# Patient Record
Sex: Female | Born: 1970
Health system: Southern US, Community
[De-identification: ages and names within clinical notes are randomized; demographics above are authoritative.]

## PROBLEM LIST (undated history)

## (undated) DIAGNOSIS — Z803 Family history of malignant neoplasm of breast: Secondary | ICD-10-CM

## (undated) DIAGNOSIS — B977 Papillomavirus as the cause of diseases classified elsewhere: Secondary | ICD-10-CM

## (undated) DIAGNOSIS — B009 Herpesviral infection, unspecified: Secondary | ICD-10-CM

## (undated) DIAGNOSIS — O021 Missed abortion: Secondary | ICD-10-CM

## (undated) DIAGNOSIS — E785 Hyperlipidemia, unspecified: Secondary | ICD-10-CM

## (undated) DIAGNOSIS — K5900 Constipation, unspecified: Secondary | ICD-10-CM

## (undated) DIAGNOSIS — K219 Gastro-esophageal reflux disease without esophagitis: Secondary | ICD-10-CM

## (undated) DIAGNOSIS — N809 Endometriosis, unspecified: Secondary | ICD-10-CM

## (undated) DIAGNOSIS — Z800 Family history of malignant neoplasm of digestive organs: Secondary | ICD-10-CM

## (undated) DIAGNOSIS — J302 Other seasonal allergic rhinitis: Secondary | ICD-10-CM

## (undated) DIAGNOSIS — Z87442 Personal history of urinary calculi: Secondary | ICD-10-CM

## (undated) HISTORY — DX: Papillomavirus as the cause of diseases classified elsewhere: B97.7

## (undated) HISTORY — DX: Endometriosis, unspecified: N80.9

## (undated) HISTORY — PX: COLONOSCOPY: SHX174

## (undated) HISTORY — DX: Herpesviral infection, unspecified: B00.9

## (undated) HISTORY — PX: WISDOM TOOTH EXTRACTION: SHX21

## (undated) HISTORY — PX: CATARACT EXTRACTION: SUR2

## (undated) HISTORY — DX: Family history of malignant neoplasm of digestive organs: Z80.0

## (undated) HISTORY — DX: Family history of malignant neoplasm of breast: Z80.3

## (undated) HISTORY — PX: COLPOSCOPY: SHX161

---

## 1998-05-16 HISTORY — PX: RETINAL DETACHMENT SURGERY: SHX105

## 1998-07-24 ENCOUNTER — Other Ambulatory Visit: Admission: RE | Admit: 1998-07-24 | Discharge: 1998-07-24 | Payer: Self-pay | Admitting: *Deleted

## 1999-05-04 ENCOUNTER — Ambulatory Visit (HOSPITAL_COMMUNITY): Admission: RE | Admit: 1999-05-04 | Discharge: 1999-05-05 | Payer: Self-pay | Admitting: Ophthalmology

## 1999-05-04 ENCOUNTER — Encounter: Payer: Self-pay | Admitting: Ophthalmology

## 1999-10-15 ENCOUNTER — Other Ambulatory Visit: Admission: RE | Admit: 1999-10-15 | Discharge: 1999-10-15 | Payer: Self-pay | Admitting: *Deleted

## 2001-01-08 ENCOUNTER — Other Ambulatory Visit: Admission: RE | Admit: 2001-01-08 | Discharge: 2001-01-08 | Payer: Self-pay | Admitting: *Deleted

## 2001-07-26 ENCOUNTER — Ambulatory Visit (HOSPITAL_COMMUNITY): Admission: RE | Admit: 2001-07-26 | Discharge: 2001-07-26 | Payer: Self-pay | Admitting: Neurology

## 2001-07-26 ENCOUNTER — Encounter: Payer: Self-pay | Admitting: Neurology

## 2001-08-30 ENCOUNTER — Encounter: Admission: RE | Admit: 2001-08-30 | Discharge: 2001-08-30 | Payer: Self-pay | Admitting: *Deleted

## 2001-11-12 ENCOUNTER — Other Ambulatory Visit: Admission: RE | Admit: 2001-11-12 | Discharge: 2001-11-12 | Payer: Self-pay | Admitting: Obstetrics & Gynecology

## 2002-02-14 ENCOUNTER — Encounter: Admission: RE | Admit: 2002-02-14 | Discharge: 2002-02-14 | Payer: Self-pay | Admitting: *Deleted

## 2002-05-25 ENCOUNTER — Inpatient Hospital Stay (HOSPITAL_COMMUNITY): Admission: AD | Admit: 2002-05-25 | Discharge: 2002-05-25 | Payer: Self-pay | Admitting: Obstetrics & Gynecology

## 2002-06-03 ENCOUNTER — Inpatient Hospital Stay (HOSPITAL_COMMUNITY): Admission: AD | Admit: 2002-06-03 | Discharge: 2002-06-06 | Payer: Self-pay | Admitting: Obstetrics and Gynecology

## 2002-06-04 ENCOUNTER — Encounter (INDEPENDENT_AMBULATORY_CARE_PROVIDER_SITE_OTHER): Payer: Self-pay | Admitting: Specialist

## 2002-06-07 ENCOUNTER — Encounter: Admission: RE | Admit: 2002-06-07 | Discharge: 2002-07-07 | Payer: Self-pay | Admitting: Obstetrics & Gynecology

## 2002-07-04 ENCOUNTER — Other Ambulatory Visit: Admission: RE | Admit: 2002-07-04 | Discharge: 2002-07-04 | Payer: Self-pay | Admitting: Obstetrics & Gynecology

## 2002-07-08 ENCOUNTER — Encounter: Admission: RE | Admit: 2002-07-08 | Discharge: 2002-08-07 | Payer: Self-pay | Admitting: Obstetrics & Gynecology

## 2002-09-06 ENCOUNTER — Encounter: Admission: RE | Admit: 2002-09-06 | Discharge: 2002-10-06 | Payer: Self-pay | Admitting: Obstetrics & Gynecology

## 2002-11-06 ENCOUNTER — Encounter: Admission: RE | Admit: 2002-11-06 | Discharge: 2002-12-06 | Payer: Self-pay | Admitting: Obstetrics & Gynecology

## 2003-01-03 ENCOUNTER — Other Ambulatory Visit: Admission: RE | Admit: 2003-01-03 | Discharge: 2003-01-03 | Payer: Self-pay | Admitting: Obstetrics & Gynecology

## 2003-05-22 ENCOUNTER — Encounter: Admission: RE | Admit: 2003-05-22 | Discharge: 2003-05-22 | Payer: Self-pay | Admitting: Family Medicine

## 2003-07-31 ENCOUNTER — Other Ambulatory Visit: Admission: RE | Admit: 2003-07-31 | Discharge: 2003-07-31 | Payer: Self-pay | Admitting: Obstetrics & Gynecology

## 2004-01-23 ENCOUNTER — Inpatient Hospital Stay (HOSPITAL_COMMUNITY): Admission: AD | Admit: 2004-01-23 | Discharge: 2004-01-25 | Payer: Self-pay | Admitting: Obstetrics & Gynecology

## 2004-02-21 ENCOUNTER — Inpatient Hospital Stay (HOSPITAL_COMMUNITY): Admission: AD | Admit: 2004-02-21 | Discharge: 2004-02-23 | Payer: Self-pay | Admitting: Obstetrics and Gynecology

## 2004-02-24 ENCOUNTER — Encounter: Admission: RE | Admit: 2004-02-24 | Discharge: 2004-03-25 | Payer: Self-pay | Admitting: Obstetrics & Gynecology

## 2004-03-26 ENCOUNTER — Encounter: Admission: RE | Admit: 2004-03-26 | Discharge: 2004-04-25 | Payer: Self-pay | Admitting: Obstetrics & Gynecology

## 2004-05-26 ENCOUNTER — Encounter: Admission: RE | Admit: 2004-05-26 | Discharge: 2004-06-25 | Payer: Self-pay | Admitting: Obstetrics & Gynecology

## 2004-06-26 ENCOUNTER — Encounter: Admission: RE | Admit: 2004-06-26 | Discharge: 2004-07-26 | Payer: Self-pay | Admitting: Obstetrics & Gynecology

## 2004-08-24 ENCOUNTER — Encounter: Admission: RE | Admit: 2004-08-24 | Discharge: 2004-09-23 | Payer: Self-pay | Admitting: Obstetrics & Gynecology

## 2004-10-24 ENCOUNTER — Encounter: Admission: RE | Admit: 2004-10-24 | Discharge: 2004-11-01 | Payer: Self-pay | Admitting: Obstetrics & Gynecology

## 2010-03-22 ENCOUNTER — Encounter: Admission: RE | Admit: 2010-03-22 | Discharge: 2010-03-22 | Payer: Self-pay | Admitting: Family Medicine

## 2010-07-16 ENCOUNTER — Other Ambulatory Visit: Payer: Self-pay | Admitting: Obstetrics & Gynecology

## 2010-10-01 NOTE — H&P (Signed)
Ashley Marshall, Ashley Marshall               ACCOUNT NO.:  [id number]   MEDICAL RECORD NO.:  [id number]          PATIENT TYPE:  INP   LOCATION:  9174                          FACILITY:  [facility]   PHYSICIAN:  Lenoard Aden, M.D.DATE OF BIRTH:  07-Jun-1970   DATE OF ADMISSION:  02/21/2004  DATE OF DISCHARGE:                                HISTORY & PHYSICAL   CHIEF COMPLAINT:  Labor.   HISTORY OF PRESENT ILLNESS:  The patient is a 40 year old white female, G3,  P1, EDD of March 01, 2004, at 38+ weeks, with increasing frequency of  contractions.   MEDICATIONS:  She has medications including prenatal vitamins.   ALLERGIES:  1.  FLAGYL.  2.  AMBIEN.  3.  SEPTRA.   PAST MEDICAL HISTORY:  History of an SAB, 2002, spontaneous delivery in  2004, with induction for pregnancy induced hypertension.  She has no other  substantial medical history except for history of kidney stones and a  retinal surgery in 2000.   FAMILY HISTORY:  She has a family history of mitral valve prolapse,  myocardial infarction, and stomach and liver cancer.   LABORATORY DATA:  Prenatal lab data reveals a blood type of O positive, Rh  antibody negative, rubella immune, hepatitis and HIV negative, GBS negative.   PHYSICAL EXAMINATION:  GENERAL:  She is a well-developed, well-nourished  white female in no acute distress.  HEENT:  Normal.  LUNGS:  Clear.  HEART:  Regular rhythm.  ABDOMEN:  Soft, gravid, nontender.  Estimated fetal weight 7 to 7-1/2  pounds.  CERVIX:  Cervix is 7 cm, 90%, at vertex, -1.  Artificial rupture of  membranes reveals clear liquid.  EXTREMITIES:  __________.  NEUROLOGIC:  Nonfocal.   IMPRESSION:  Term intrauterine pregnancy in active labor.  History of PIH,  GBS negative.   PLAN:  Anticipate attempts at vaginal delivery, epidural as needed.      RJT/MEDQ  D:  02/21/2004  T:  02/21/2004  Job:  [id number]

## 2010-10-01 NOTE — Consult Note (Signed)
   NAMEJORGE, Ashley Marshall NO.:  [id number]   MEDICAL RECORD NO.:  [id number]                   PATIENT TYPE:   LOCATION:                                       FACILITY:   PHYSICIAN:  Lenoard Aden, M.D.             DATE OF BIRTH:   DATE OF CONSULTATION:  05/25/2002  DATE OF DISCHARGE:                                   CONSULTATION   CHIEF COMPLAINT:  Rule out preeclampsia.   HISTORY OF PRESENT ILLNESS:  The patient is a 40 year old, white female, G2,  P0, EDD June 17, 2002, at 36+ weeks, who had elevated blood pressures as  seen in the office on May 23, 2002, of 144/92 and repeat was 124/80.  She  had a headache on May 24, 2002.  Reports good fetal movement.  Today she  presents for laboratory evaluation, blood pressure check, and fetal  surveillance.   MEDICATIONS:  1. Valtrex.  2. Prenatal vitamins.   ALLERGIES:  FLAGYL, AMBIEN, and SEPTRA.   PAST MEDICAL HISTORY:  1. History of SAB without D&E in December of 2002.  2. History of abnormal Pap smear.  3. History of HSV.   FAMILY HISTORY:  Remarkable for hypertension, DVT, and MVP.   PAST SURGICAL HISTORY:  Noncontributory, except for detached retina surgery  and history of ureterolithiasis.   PRENATAL LABORATORY DATA:  Blood type O+.  Rubella immune.  Hepatitis B  surface antigen negative.  Hepatitis B surface negative.  HIV nonreactive.  GC and chlamydia negative.   PHYSICAL EXAMINATION:  VITAL SIGNS:  Blood pressure 140-150/80-92.  GENERAL APPEARANCE:  She is a well-developed, well-nourished, white female  in no apparent distress.  HEENT:  Normal.  LUNGS:  Clear.  HEART:  Regular rhythm.  ABDOMEN:  Soft, gravid, and nontender.  NST is reactive.  PELVIC:  The cervical exam was deferred.  NEUROLOGIC:  Nonfocal.  DTRs 2+.  No evidence of clonus.   LABORATORY DATA:  SGOT 14 and SPGT less than 19.  Creatinine 0.5.  Uric acid  3.2.  Platelet count 223.  LDH 118.   IMPRESSION:  1. 36-week intrauterine pregnancy.  2. Probable gestational hypertension.  3. No stigmata of preeclampsia.    PLAN:  1. Out of work.  2. Twice weekly visits in the office.  3. Check 24-hour urine.  4. Preeclamptic precautions given.                                               Lenoard Aden, M.D.    RJT/MEDQ  D:  05/25/2002  T:  05/25/2002  Job:  [id number]

## 2010-10-01 NOTE — H&P (Signed)
NAMEGIOVANNI, BATH NO.:  [id number]   MEDICAL RECORD NO.:  [id number]                   PATIENT TYPE:   LOCATION:                                       FACILITY:   PHYSICIAN:  [place] B. Earlene Plater, M.D.               DATE OF BIRTH:   DATE OF ADMISSION:  06/03/2002  DATE OF DISCHARGE:                                HISTORY & PHYSICAL   ADMISSION DIAGNOSES:  1. A 38 week intrauterine pregnancy.  2. Pregnancy induced hypertension.   HISTORY OF PRESENT ILLNESS:  A 40 year old white female gravida 2, para 0,  A1 at 38 weeks seen in the office today in follow-up.  Noted to have blood  pressure of 140/100.  Does not have headaches, visual disturbances, or  abdominal pain.  Has had mild elevation of blood pressure in the office in  the last two weeks in the 130s/80s-90s range.  Had a 24-hour urine on  May 26, 2002 which showed 126 mg protein.  Reports active fetus.  No  contractions, leakage, or vaginal bleeding.  Prenatal care [facility]  Genia Del, M.D.  Otherwise, uncomplicated except for history of HSV  taking Valtrex for suppression.  No recent symptoms.   PAST MEDICAL HISTORY:  History of kidney stones.   PAST SURGICAL HISTORY:  Eye surgery for detached retina.   FAMILY HISTORY:  Heart disease, hypertension.  Mother with a history of DVT  and anemia.  Paternal grandmother with diabetes and stomach cancer.   SOCIAL HISTORY:  No alcohol, tobacco, or drugs.   MEDICATIONS:  Prenatal vitamins and Tums.   ALLERGIES:  FLAGYL, AMBIEN, SEPTRA.   PRENATAL LABORATORIES:  O+.  Rubella immune.  RPR, hepatitis B, HIV, GC,  Chlamydia all negative.  Glucola normal.  Group B Strep negative.   PHYSICAL EXAMINATION:  VITAL SIGNS:  Blood pressure 140/100, weight 170.  Fetal heart tones 130s and reactive tracing.  GENERAL:  Alert and oriented, no acute distress.  SKIN:  Warm, dry.  No lesions.  HEART:  Regular rate and rhythm.  LUNGS:  Clear  to auscultation.  ABDOMEN:  Gravid.  Fundal height appropriate.  PELVIC:  Cervix is 1-2 cm dilated, 70% effaced, -1 station, and vertex.  EXTREMITIES:  Lower extremities are free of edema and there is no  hyperreflexia or clonus.    ASSESSMENT:  A 38 week intrauterine pregnancy with pregnancy induced  hypertension.  No severe symptoms or signs at this point.   PLAN:  Admission for induction of labor.                                               Gerri Spore B. Earlene Plater, M.D.    WBD/MEDQ  D:  06/03/2002  T:  06/03/2002  Job:  [id number]

## 2010-10-01 NOTE — H&P (Signed)
Ashley Marshall, HORINE                         ACCOUNT NO.:  [id number]   MEDICAL RECORD NO.:  [id number]                   PATIENT TYPE:  INP   LOCATION:  9156                                 [facility]ILITY:  [facility]   PHYSICIAN:  Genia Del, M.D.             DATE OF BIRTH:  November 11, 1970   DATE OF ADMISSION:  01/23/2004  DATE OF DISCHARGE:                                HISTORY & PHYSICAL   HISTORY AND PHYSICAL:  Ashley Marshall is a 40 year old, G3, P1, A1, estimated  date of delivery February 29, 2004, at 34 weeks and 5 days gestation.   REASON FOR ADMISSION:  Oligohydramnios at the 5th percentile.   HISTORY OF PRESENT ILLNESS:  The patient has a history of mild PIH;  therefore, an ultrasound was done to verify growth and amniotic fluid and  the [facility] office.  The amniotic fluid was found to be at 7.9 cm  for the 5th percentile.  Estimated fetal weight was 83rd percentile.  Dopplers were normal.  Fetal movement and fetal breathing movements were  present.  The presentation was cephalic.  The patient experienced good fetal  movements, no uterine contractions, no fluid leak, no vaginal bleeding, and  no PIH symptoms.   PAST MEDICAL HISTORY:  Positive for mild PIH during her last pregnancy.   PAST SURGICAL HISTORY:  Detached retina.  Surgical correct in December of  2000.   PAST OBSTETRICAL HISTORY:  1.  December of 2002 - 7 plus weeks complete spontaneous abortion.  2.  January of 2004 - 38 week spontaneous vaginal delivery, baby girl, 6      pounds, 14 ounces.  The patient was induced for mild PIH.   DRUG ALLERGIES:  1.  FLAGYL.  2.  AMBIEN.  3.  SEPTRA.   CURRENT MEDICATIONS:  Prenatal vitamins.   SOCIAL HISTORY:  Married.  Nonsmoker.   FAMILY HISTORY:  Noncontributory.   GYNECOLOGIC HISTORY:  Rare genital HSV outbreaks.  Abnormal Pap test.  Colposcopy.  No cervical treatment needed.   HISTORY OF PRESENT PREGNANCY:  First trimester was normal.  Labs showed  hemoglobin at 13.5, platelets 265, blood group O positive, Rh antibody  negative, syphilis nonreactive, __________ nonreactive, HIV nonreactive.  Rubella titer is immune.  Gonorrhea and Chlamydia negative.  Pap test  normal.  In the second trimester, triple test was within normal limits.  Ultrasound review of anatomy was within normal limits at 20 plus weeks.  Placenta posterior, normal.  Amniotic fluid normal.  Cervix closed, 5.4 cm.  In the third trimester, 1 hour GTT was normal at 26 plus weeks.  Group B  strep is not done yet.  Blood pressures remained normal until the last  visit, where it was slightly increased.   REVIEW OF SYSTEMS:  CONSTITUTIONAL:  Negative.  HEENT:  Negative.  CARDIOVASCULAR:  Negative.  RESPIRATORY:  Negative.  GI:  Negative.  GU:  Negative.  DERMATOLOGIC:  Negative.  NEUROLOGIC:  Negative.   PHYSICAL EXAMINATION:  GENERAL:  In no apparent distress.  VITAL SIGNS:  Blood pressure 136/94, pulse regular, afebrile.  Urine for  proteins negative.  Glucose negative.  LUNGS:  Clear bilaterally.  Regular cardiac rhythm.  Gravid uterus.  Cephalic presentation.  VAGINAL EXAM:  Deferred.  ABDOMEN:  Fetal heart rate 159 per minute.  EXTREMITIES:  Lower limbs normal.  No edema.  No clonus.   ULTRASOUND:  As described in the HPI.   IMPRESSION:  G3, P1, A1, at 34 plus weeks gestation with oligohydramnios at  the 5th percentile.  Fetal well-being reassuring.  Mild pregnancy-induced  hypertension.   PLAN:  Admit to [facility].  IV fluids, Ringers lactate  150.  Monitoring.  PIH labs.  Repeat amniotic fluid index in 2 days.  May  use __________ then for IV fluids at home and monitoring.                                               Genia Del, M.D.    ML/MEDQ  D:  01/25/2004  T:  01/25/2004  Job:  [id number]

## 2010-10-01 NOTE — Op Note (Signed)
NAMELORAL, CAMPI               ACCOUNT NO.:  [id number]   MEDICAL RECORD NO.:  [id number]          PATIENT TYPE:  INP   LOCATION:  9125                          [facility]ACILITY:  [facility]   PHYSICIAN:  Lenoard Aden, M.D.DATE OF BIRTH:  [birth year]   DATE OF PROCEDURE:  02/21/2004  DATE OF DISCHARGE:                                 OPERATIVE REPORT   DELIVERY NOTE:   IDENTIFICATION FOR OPERATIVE DELIVERY:  Maternal exhaustion, requests  assistance.   POSTOPERATIVE DIAGNOSIS:  Maternal exhaustion, requests assistance.   PROCEDURE:  Kiwi cup outlet vacuum-assisted vaginal delivery x3 pulls.   SURGEON:  Lenoard Aden, M.D.   ANESTHESIA:  None except for local.   ESTIMATED BLOOD LOSS:  500 mL.   COMPLICATIONS:  None.   BRIEF DESCRIPTION OF PROCEDURE:  After being apprised of the risks and  benefits of vacuum assistance including small incidence of cephalohematoma,  scalp laceration, intracranial hemorrhage, Kiwi cup is applied at the proper  location, LOA less than 45 degrees, +3/+4 station, for 3 pulls over a second-  degree midline episiotomy of a full-term living female.  Upon suctioning of  the fetal vertex, small shoulder dystocia is encountered, which is relieved  with McRoberts' maneuver and mild suprapubic pressure.  Apgars are 8 and 9.  Placenta is delivered spontaneously intact, 3-vessel cord is noted, cord  bloods are collected.  No cervical or vaginal lacerations are noted.  Repair  of episiotomy with 3-0 Vicryl Rapide.  Estimated blood loss -- 500 mL.  Patient and baby recovering well.      RJT/MEDQ  D:  02/21/2004  T:  02/22/2004  Job:  [id number]   cc:   [facility]

## 2011-02-15 ENCOUNTER — Other Ambulatory Visit: Payer: Self-pay | Admitting: Family Medicine

## 2011-02-15 DIAGNOSIS — Z1231 Encounter for screening mammogram for malignant neoplasm of breast: Secondary | ICD-10-CM

## 2011-03-24 ENCOUNTER — Ambulatory Visit
Admission: RE | Admit: 2011-03-24 | Discharge: 2011-03-24 | Disposition: A | Discharge status: Home / Self Care | Payer: 59 | Source: Ambulatory Visit | Attending: Family Medicine | Admitting: Family Medicine

## 2011-03-24 DIAGNOSIS — Z1231 Encounter for screening mammogram for malignant neoplasm of breast: Secondary | ICD-10-CM

## 2011-09-12 ENCOUNTER — Other Ambulatory Visit: Payer: Self-pay | Admitting: Gastroenterology

## 2012-02-20 ENCOUNTER — Other Ambulatory Visit: Payer: Self-pay | Admitting: Family Medicine

## 2012-02-20 DIAGNOSIS — Z1231 Encounter for screening mammogram for malignant neoplasm of breast: Secondary | ICD-10-CM

## 2012-03-26 ENCOUNTER — Ambulatory Visit
Admission: RE | Admit: 2012-03-26 | Discharge: 2012-03-26 | Disposition: A | Discharge status: Home / Self Care | Payer: 59 | Source: Ambulatory Visit | Attending: Family Medicine | Admitting: Family Medicine

## 2012-03-26 DIAGNOSIS — Z1231 Encounter for screening mammogram for malignant neoplasm of breast: Secondary | ICD-10-CM

## 2012-06-30 ENCOUNTER — Other Ambulatory Visit: Payer: Self-pay

## 2013-02-18 ENCOUNTER — Other Ambulatory Visit: Payer: Self-pay

## 2013-02-18 DIAGNOSIS — Z1231 Encounter for screening mammogram for malignant neoplasm of breast: Secondary | ICD-10-CM

## 2013-03-21 ENCOUNTER — Other Ambulatory Visit: Payer: Self-pay

## 2013-03-27 ENCOUNTER — Ambulatory Visit
Admission: RE | Admit: 2013-03-27 | Discharge: 2013-03-27 | Disposition: A | Discharge status: Home / Self Care | Payer: 59 | Source: Ambulatory Visit

## 2013-03-27 DIAGNOSIS — Z1231 Encounter for screening mammogram for malignant neoplasm of breast: Secondary | ICD-10-CM

## 2014-01-23 ENCOUNTER — Other Ambulatory Visit: Payer: Self-pay

## 2014-01-23 DIAGNOSIS — Z1231 Encounter for screening mammogram for malignant neoplasm of breast: Secondary | ICD-10-CM

## 2014-04-02 ENCOUNTER — Ambulatory Visit
Admission: RE | Admit: 2014-04-02 | Discharge: 2014-04-02 | Disposition: A | Discharge status: Home / Self Care | Payer: 59 | Source: Ambulatory Visit

## 2014-04-02 DIAGNOSIS — Z1231 Encounter for screening mammogram for malignant neoplasm of breast: Secondary | ICD-10-CM

## 2015-03-04 ENCOUNTER — Other Ambulatory Visit: Payer: Self-pay

## 2015-03-04 DIAGNOSIS — Z1231 Encounter for screening mammogram for malignant neoplasm of breast: Secondary | ICD-10-CM

## 2015-04-08 ENCOUNTER — Ambulatory Visit
Admission: RE | Admit: 2015-04-08 | Discharge: 2015-04-08 | Disposition: A | Discharge status: Home / Self Care | Payer: 59 | Source: Ambulatory Visit

## 2015-04-08 DIAGNOSIS — Z1231 Encounter for screening mammogram for malignant neoplasm of breast: Secondary | ICD-10-CM

## 2015-07-02 DIAGNOSIS — J111 Influenza due to unidentified influenza virus with other respiratory manifestations: Secondary | ICD-10-CM | POA: Diagnosis not present

## 2015-09-07 DIAGNOSIS — Z6824 Body mass index (BMI) 24.0-24.9, adult: Secondary | ICD-10-CM | POA: Diagnosis not present

## 2015-09-07 DIAGNOSIS — Z01419 Encounter for gynecological examination (general) (routine) without abnormal findings: Secondary | ICD-10-CM | POA: Diagnosis not present

## 2016-03-08 ENCOUNTER — Other Ambulatory Visit: Payer: Self-pay | Admitting: Family Medicine

## 2016-03-08 DIAGNOSIS — Z1231 Encounter for screening mammogram for malignant neoplasm of breast: Secondary | ICD-10-CM

## 2016-04-12 ENCOUNTER — Ambulatory Visit
Admission: RE | Admit: 2016-04-12 | Discharge: 2016-04-12 | Disposition: A | Discharge status: Home / Self Care | Payer: 59 | Source: Ambulatory Visit | Attending: Family Medicine | Admitting: Family Medicine

## 2016-04-12 DIAGNOSIS — Z1231 Encounter for screening mammogram for malignant neoplasm of breast: Secondary | ICD-10-CM

## 2016-04-13 DIAGNOSIS — H5213 Myopia, bilateral: Secondary | ICD-10-CM | POA: Diagnosis not present

## 2016-04-13 DIAGNOSIS — H40052 Ocular hypertension, left eye: Secondary | ICD-10-CM | POA: Diagnosis not present

## 2016-04-13 DIAGNOSIS — H40051 Ocular hypertension, right eye: Secondary | ICD-10-CM | POA: Diagnosis not present

## 2016-04-18 DIAGNOSIS — R350 Frequency of micturition: Secondary | ICD-10-CM | POA: Diagnosis not present

## 2016-04-18 MED FILL — NITROFURANTOIN MONO-MCR 100: 100 | 7 days supply | Qty: 14 | Fill #0

## 2016-05-11 DIAGNOSIS — A6000 Herpesviral infection of urogenital system, unspecified: Secondary | ICD-10-CM | POA: Diagnosis not present

## 2016-05-11 DIAGNOSIS — E7800 Pure hypercholesterolemia, unspecified: Secondary | ICD-10-CM | POA: Diagnosis not present

## 2016-05-11 DIAGNOSIS — Z0000 Encounter for general adult medical examination without abnormal findings: Secondary | ICD-10-CM | POA: Diagnosis not present

## 2016-05-18 MED FILL — AMOX TR-K CLV 875-125 MG TA: 875-125 | 10 days supply | Qty: 20 | Fill #0

## 2016-07-21 DIAGNOSIS — L814 Other melanin hyperpigmentation: Secondary | ICD-10-CM | POA: Diagnosis not present

## 2016-07-21 DIAGNOSIS — Z86018 Personal history of other benign neoplasm: Secondary | ICD-10-CM | POA: Diagnosis not present

## 2016-07-21 DIAGNOSIS — L719 Rosacea, unspecified: Secondary | ICD-10-CM | POA: Diagnosis not present

## 2016-07-21 DIAGNOSIS — L718 Other rosacea: Secondary | ICD-10-CM | POA: Diagnosis not present

## 2016-07-21 DIAGNOSIS — D1801 Hemangioma of skin and subcutaneous tissue: Secondary | ICD-10-CM | POA: Diagnosis not present

## 2016-07-21 DIAGNOSIS — L821 Other seborrheic keratosis: Secondary | ICD-10-CM | POA: Diagnosis not present

## 2016-07-21 DIAGNOSIS — D225 Melanocytic nevi of trunk: Secondary | ICD-10-CM | POA: Diagnosis not present

## 2016-08-24 MED FILL — NEO/POLYMYXIN/DEXAMETH DROP: 3.5-10000-0 | 50 days supply | Qty: 5 | Fill #0

## 2016-09-14 ENCOUNTER — Encounter: Payer: Self-pay | Admitting: Obstetrics & Gynecology

## 2016-09-14 ENCOUNTER — Ambulatory Visit (INDEPENDENT_AMBULATORY_CARE_PROVIDER_SITE_OTHER): Payer: 59 | Admitting: Obstetrics & Gynecology

## 2016-09-14 VITALS — BP 132/84 | Ht 62.5 in | Wt 138.0 lb

## 2016-09-14 DIAGNOSIS — Z01419 Encounter for gynecological examination (general) (routine) without abnormal findings: Secondary | ICD-10-CM

## 2016-09-14 DIAGNOSIS — N945 Secondary dysmenorrhea: Secondary | ICD-10-CM | POA: Diagnosis not present

## 2016-09-14 DIAGNOSIS — N943 Premenstrual tension syndrome: Secondary | ICD-10-CM | POA: Diagnosis not present

## 2016-09-14 DIAGNOSIS — Z3049 Encounter for surveillance of other contraceptives: Secondary | ICD-10-CM

## 2016-09-14 DIAGNOSIS — Z1151 Encounter for screening for human papillomavirus (HPV): Secondary | ICD-10-CM

## 2016-09-14 MED ORDER — NORETHINDRONE 0.35 MG PO TABS: 1.0000 | ORAL_TABLET | Freq: Every day | ORAL | 4 refills | Status: DC

## 2016-09-14 NOTE — Patient Instructions (Signed)
1. Encounter for routine gynecological examination with Papanicolaou smear of cervix Normal Gyn exam except tenderness on Rt Adnexa.  Pap/HPV HR pending.  Screening mammo to schedule 03/2017.  2. Secondary dysmenorrhea Worsening Dysmeno radiating to Rt inguinal area and tender Rt Adnexa on Gyn exam.  Will F/U with Pelvic US to assess Rt Adnexa. Decision to block cycle with Progestin-only pill.  Usage/risks/benefits reviewed.  3. Premenstrual syndrome Will try to control with Progestin-only pill.  4. Encounter for surveillance of other contraceptive Vasectomy  Jezabelle, it was a pleasure to see you today!  See you soon with the Pelvic US.

## 2016-09-14 NOTE — Progress Notes (Signed)
Ashley Marshall 04/18/1971 [id number]   History:    46 y.o. G2P2 Vasectomy.  Daughter 32 and son 10 yo.  Established patient presenting for annual gyn exam   Menses q28 days, normal flow.  Continues to experience PMS, but now increased pain with menses radiating to the Rt inguinal area.  Partially improved with Aleve.  No dyspareunia.  No pelvic pain outside period time.  No vaginal d/c.  Breasts wnl.  Past medical history,surgical history, family history and social history were all reviewed and documented in the EPIC chart.  Gynecologic History Patient's last menstrual period was 09/05/2016. Contraception: vasectomy Last Pap: 2016. Results were: normal Last mammogram: 03/2016. Results were: normal  Obstetric History OB History  Gravida Para Term Preterm AB Living  2 2       2   SAB TAB Ectopic Multiple Live Births               # Outcome Date GA Lbr Len/2nd Weight Sex Delivery Anes PTL Lv  2 Para           1 Para                ROS: A ROS was performed and pertinent positives and negatives are included in the history.  GENERAL: No fevers or chills. HEENT: No change in vision, no earache, sore throat or sinus congestion. NECK: No pain or stiffness. CARDIOVASCULAR: No chest pain or pressure. No palpitations. PULMONARY: No shortness of breath, cough or wheeze. GASTROINTESTINAL: No abdominal pain, nausea, vomiting or diarrhea, melena or bright red blood per rectum. GENITOURINARY: No urinary frequency, urgency, hesitancy or dysuria. MUSCULOSKELETAL: No joint or muscle pain, no back pain, no recent trauma. DERMATOLOGIC: No rash, no itching, no lesions. ENDOCRINE: No polyuria, polydipsia, no heat or cold intolerance. No recent change in weight. HEMATOLOGICAL: No anemia or easy bruising or bleeding. NEUROLOGIC: No headache, seizures, numbness, tingling or weakness. PSYCHIATRIC: No depression, no loss of interest in normal activity or change in sleep pattern.     Exam:   BP 132/84    Ht 5' 2.5" (1.588 m)   Wt 138 lb (62.6 kg)   LMP 09/05/2016   BMI 24.84 kg/m   Body mass index is 24.84 kg/m.  General appearance : Well developed well nourished female. No acute distress HEENT: Eyes: no retinal hemorrhage or exudates,  Neck supple, trachea midline, no carotid bruits, no thyroidmegaly Lungs: Clear to auscultation, no rhonchi or wheezes, or rib retractions  Heart: Regular rate and rhythm, no murmurs or gallops Breast:Examined in sitting and supine position were symmetrical in appearance, no palpable masses or tenderness,  no skin retraction, no nipple inversion, no nipple discharge, no skin discoloration, no axillary or supraclavicular lymphadenopathy Abdomen: no palpable masses or tenderness, no rebound or guarding Extremities: no edema or skin discoloration or tenderness  Pelvic:  Bartholin, Urethra, Skene Glands: Within normal limits             Vagina: No gross lesions or discharge  Cervix: No gross lesions or discharge.  Pap/HPV done.  Uterus  RV, normal size, shape and consistency, non-tender and mobile  Adnexa  Without masses.  Tender Rt >Lt.  Anus and perineum  normal      Assessment/Plan:  46 y.o. female for annual exam   1. Encounter for routine gynecological examination with Papanicolaou smear of cervix Normal Gyn exam except tenderness on Rt Adnexa.  Pap/HPV HR pending.  Screening mammo to schedule 03/2017.  2. Secondary  dysmenorrhea Worsening Dysmeno radiating to Rt inguinal area and tender Rt Adnexa on Gyn exam.  Will F/U with Pelvic US to assess Rt Adnexa. Decision to block cycle with Progestin-only pill.  Usage/risks/benefits reviewed.  3. Premenstrual syndrome Will try to control with Progestin-only pill.  4. Encounter for surveillance of other contraceptive Vasectomy  Counseling >50% x 10 min on above issues. [name] MD, 8:40 AM 09/14/2016

## 2016-09-15 LAB — PAP, TP IMAGING W/ HPV RNA, RFLX HPV TYPE 16,18/45: HPV MRNA, HIGH RISK: NOT DETECTED

## 2016-09-16 ENCOUNTER — Encounter: Payer: Self-pay | Admitting: Obstetrics & Gynecology

## 2016-09-21 ENCOUNTER — Ambulatory Visit (INDEPENDENT_AMBULATORY_CARE_PROVIDER_SITE_OTHER): Payer: 59 | Admitting: Obstetrics & Gynecology

## 2016-09-21 ENCOUNTER — Ambulatory Visit (INDEPENDENT_AMBULATORY_CARE_PROVIDER_SITE_OTHER): Payer: 59

## 2016-09-21 ENCOUNTER — Other Ambulatory Visit: Payer: Self-pay | Admitting: Obstetrics & Gynecology

## 2016-09-21 VITALS — BP 124/78

## 2016-09-21 DIAGNOSIS — N83202 Unspecified ovarian cyst, left side: Secondary | ICD-10-CM | POA: Diagnosis not present

## 2016-09-21 DIAGNOSIS — R39198 Other difficulties with micturition: Secondary | ICD-10-CM

## 2016-09-21 DIAGNOSIS — R938 Abnormal findings on diagnostic imaging of other specified body structures: Secondary | ICD-10-CM

## 2016-09-21 DIAGNOSIS — R9389 Abnormal findings on diagnostic imaging of other specified body structures: Secondary | ICD-10-CM

## 2016-09-21 DIAGNOSIS — R102 Pelvic and perineal pain unspecified side: Secondary | ICD-10-CM

## 2016-09-21 DIAGNOSIS — N839 Noninflammatory disorder of ovary, fallopian tube and broad ligament, unspecified: Secondary | ICD-10-CM

## 2016-09-21 DIAGNOSIS — N838 Other noninflammatory disorders of ovary, fallopian tube and broad ligament: Secondary | ICD-10-CM

## 2016-09-21 DIAGNOSIS — N945 Secondary dysmenorrhea: Secondary | ICD-10-CM | POA: Diagnosis not present

## 2016-09-21 DIAGNOSIS — N83201 Unspecified ovarian cyst, right side: Secondary | ICD-10-CM | POA: Diagnosis not present

## 2016-09-21 LAB — URINALYSIS, ROUTINE W REFLEX MICROSCOPIC
Bilirubin Urine: NEGATIVE
Glucose, UA: NEGATIVE
HGB URINE DIPSTICK: NEGATIVE
KETONES UR: NEGATIVE
Leukocytes, UA: NEGATIVE
NITRITE: NEGATIVE
PROTEIN: NEGATIVE
Specific Gravity, Urine: 1.015 (ref 1.001–1.035)
pH: 6.5 (ref 5.0–8.0)

## 2016-09-21 NOTE — Progress Notes (Signed)
    Ashley Marshall 03-20-1971 [id number]        46 y.o.  [id number]   RP:  Rt pelvic pain/Dysmenorrhea for Pelvic US  Past medical history,surgical history, problem list, medications, allergies, family history and social history were all reviewed and documented in the EPIC chart.  Directed ROS with pertinent positives and negatives documented in the history of present illness/assessment and plan.  Exam:  Vitals:   09/21/16 1537  BP: 124/78   General appearance:  Normal  Pelvic US today:  T/V and T/A  Anteverted uterus with prominent endometrium with echogenic focus at 15 x 12 mm.  Endometrial line otherwise 8.2 mm.  Rt Ovary: Thick walled simple cyst 3.1 cm , pos Doppler at periphery.  Thin walled cyst with low level echoes 4.4 cm, feeder vessel on Doppler.  Lt Ovary: thin walled cyst with low level echoes 3.1 cm, neg Doppler.  Assessment/Plan:  46 y.o. [id number]   1. Female pelvic pain Rt pelvic pain and Dysmenorrhea.  Bilateral Ovarian cysts c/w Endometriomas.  Probable IU Polyp per US findings, 1.5 cm.  Will proceed with [place] Resection, D+C at the time of the Laparoscopic procedure.  2. Bilateral ovarian cysts Probable Endometriomas.  No previous Dx of Endometriosis, but US findings and clinical presentation c/w Endometriosis.  Decision to proceed with LPS Ovarian Cystectomies, possible conservative treatment of endometriosis.  Robot stand-by to discuss at [place] visit.  3. Increasing residual urine U/A negative.  Refer to Urologist.  Counseling 100% x 15 minutes on above findings and issues.  F/U Preop visit.  [name] MD, 3:51 PM 09/21/2016

## 2016-09-21 NOTE — Patient Instructions (Signed)
1. Female pelvic pain Rt pelvic pain and Dysmenorrhea.  Bilateral Ovarian cysts c/w Endometriomas.  Probable IU Polyp per US findings, 1.5 cm.  Will proceed with [place] Resection, D+C at the time of the Laparoscopic procedure.  2. Bilateral ovarian cysts Probable Endometriomas.  No previous Dx of Endometriosis, but US findings and clinical presentation c/w Endometriosis.  Decision to proceed with LPS Ovarian Cystectomies, possible conservative treatment of endometriosis.  Robot stand-by to discuss at [place] visit.  3. Increasing residual urine U/A negative.  Refer to Urologist.  Lexine Baton, see you soon at the preop visit.  You will be called by my staff to schedule surgery and for the Urology referral.

## 2016-09-22 ENCOUNTER — Telehealth: Payer: Self-pay

## 2016-09-22 ENCOUNTER — Telehealth: Payer: Self-pay | Admitting: *Deleted

## 2016-09-22 NOTE — Telephone Encounter (Signed)
I left message for patient to call me.

## 2016-09-22 NOTE — Telephone Encounter (Signed)
-----   Message from [name], MD sent at 09/21/2016  4:29 PM EDT ----- Please refer to Urology.  Worsening urinary retention.  Residual urine ++ at time of Trans-vaginal [place] post miction.  U/A negative.

## 2016-09-22 NOTE — Telephone Encounter (Signed)
Notes faxed to [facility] they will fax me back with time and date.

## 2016-09-23 ENCOUNTER — Telehealth: Payer: Self-pay

## 2016-09-23 ENCOUNTER — Other Ambulatory Visit: Payer: Self-pay | Admitting: Obstetrics & Gynecology

## 2016-09-23 DIAGNOSIS — N83202 Unspecified ovarian cyst, left side: Principal | ICD-10-CM

## 2016-09-23 DIAGNOSIS — N83201 Unspecified ovarian cyst, right side: Secondary | ICD-10-CM

## 2016-09-23 NOTE — Telephone Encounter (Signed)
Agree with Ca125.

## 2016-09-23 NOTE — Telephone Encounter (Signed)
Patient is nurse at [employer]. She asked if you would order at CA125 because of the cysts on her ovaries. She said it would make her feel better.   We scheduled her surgery for 11/04/16 at 7:30am at [facility]. Rosemarie Ax will call patient to schedule pre op appt with Dr. Carlean Jews. Discussed her ins and est financial prepymt and she will receive financial letter regarding that as well.

## 2016-09-23 NOTE — Telephone Encounter (Signed)
Patient informed okay to do CA125. Order placed and she will call next week for lab appt.

## 2016-09-28 ENCOUNTER — Other Ambulatory Visit: Payer: 59

## 2016-09-28 DIAGNOSIS — N83201 Unspecified ovarian cyst, right side: Secondary | ICD-10-CM

## 2016-09-28 DIAGNOSIS — N83202 Unspecified ovarian cyst, left side: Principal | ICD-10-CM

## 2016-09-29 LAB — CA 125: CA 125: 26 U/mL (ref <35)

## 2016-09-30 ENCOUNTER — Encounter: Payer: Self-pay | Admitting: Obstetrics & Gynecology

## 2016-09-30 ENCOUNTER — Telehealth: Payer: Self-pay

## 2016-09-30 NOTE — Telephone Encounter (Signed)
Yes I feel good about 6 wks.

## 2016-09-30 NOTE — Telephone Encounter (Signed)
Patient emailed about some things and this was one of them.  "When I met with Dr. Dellis Filbert on 09/21/16, she asked if my surgery could be done in the next 4 weeks. However, it is scheduled for 6 weeks (due to the robot access)on 11/04/16. Just wanted to see if she was okay waiting 6 weeks. "

## 2016-09-30 NOTE — Telephone Encounter (Signed)
Patient informed through My Chart email.

## 2016-10-03 ENCOUNTER — Encounter: Payer: Self-pay | Admitting: Anesthesiology

## 2016-10-03 NOTE — Telephone Encounter (Signed)
Pt scheduled on 12/12/16 @ 1:30pm with Dr.Dahlstedt pt aware

## 2016-10-18 DIAGNOSIS — Z0289 Encounter for other administrative examinations: Secondary | ICD-10-CM

## 2016-10-21 NOTE — Patient Instructions (Addendum)
Your procedure is scheduled on:  Friday, June 22  Enter through the Main Entrance of [facility] at: 6 am  Pick up the phone at the desk and dial [phone].  Call this number if you have problems the morning of surgery: [phone].  Remember: Do NOT eat or drink (including water) after midnight Thursday.  Take these medicines the morning of surgery with a SIP OF WATER: Valtrex if needed.  Do not smoke on day of surgery.  Stop all herbal medications at this time.  Do NOT wear jewelry (body piercing), metal hair clips/bobby pins, make-up, or nail polish. Do NOT wear lotions, powders, or perfumes.  You may wear deoderant. Do NOT shave for 48 hours prior to surgery. Do NOT bring valuables to the hospital.  Leave suitcase in car.  After surgery it may be brought to your room.  For patients admitted to the hospital, checkout time is 11:00 AM the day of discharge. Have a responsible adult drive you home and stay with you for 24 hours after your procedure.  Home with husband Ashley Marshall cell [phone].

## 2016-10-25 ENCOUNTER — Encounter: Payer: Self-pay | Admitting: Anesthesiology

## 2016-10-25 ENCOUNTER — Ambulatory Visit (INDEPENDENT_AMBULATORY_CARE_PROVIDER_SITE_OTHER): Payer: 59 | Admitting: Obstetrics & Gynecology

## 2016-10-25 ENCOUNTER — Encounter: Payer: Self-pay | Admitting: Obstetrics & Gynecology

## 2016-10-25 VITALS — BP 138/80

## 2016-10-25 DIAGNOSIS — N840 Polyp of corpus uteri: Secondary | ICD-10-CM

## 2016-10-25 DIAGNOSIS — N83202 Unspecified ovarian cyst, left side: Secondary | ICD-10-CM

## 2016-10-25 DIAGNOSIS — N83201 Unspecified ovarian cyst, right side: Secondary | ICD-10-CM | POA: Diagnosis not present

## 2016-10-25 DIAGNOSIS — R102 Pelvic and perineal pain: Secondary | ICD-10-CM

## 2016-10-25 NOTE — Progress Notes (Signed)
    Ashley Marshall 1970-05-20 [id number]        46 y.o.  [id number] Married.  Husband present at visit.  RP:  Preop visit for LPS Bilateral Ovarian Cystectomies, probable treatment of Endometriosis, HSC Resection, D+C  HPI:  No change x last visit.  No current vaginal bleeding.  Continued Rt pelvic pain.  Past medical history,surgical history, problem list, medications, allergies, family history and social history were all reviewed and documented in the EPIC chart.  Directed ROS with pertinent positives and negatives documented in the history of present illness/assessment and plan.  Exam:  There were no vitals filed for this visit. General appearance:  Normal  Pelvic US 09/21/2016:  T/V and T/A  Anteverted uterus with prominent endometrium with echogenic focus at 15 x 12 mm.  Endometrial line otherwise 8.2 mm.  Rt Ovary: Thick walled simple cyst 3.1 cm , pos Doppler at periphery.  Thin walled cyst with low level echoes 4.4 cm, feeder vessel on Doppler.  Lt Ovary: thin walled cyst with low level echoes 3.1 cm, neg Doppler.   Assessment/Plan:  46 y.o. [id number]   1. Female pelvic pain Rt pelvic pain and Dysmenorrhea.  Bilateral Ovarian cysts c/w Endometriomas.    2. Bilateral ovarian cysts  Probable Endometriomas.  No previous Dx of Endometriosis, but US findings and clinical presentation c/w Endometriosis.  Decision to proceed with Robotic LPS Ovarian Cystectomies, possible Right Oophorectomy,  conservative treatment of endometriosis.   3. Endometrial polyp Will proceed with [place] Resection, D+C                        Patient was counseled as to the risk of surgery to include the following:  1. Infection (prohylactic antibiotics will be administered)  2. DVT/Pulmonary Embolism (prophylactic pneumo compression stockings will be used)  3.Trauma to internal organs requiring additional surgical procedure to repair any injury to     Internal organs requiring perhaps additional  hospitalization days.  4.Hemmorhage requiring transfusion and blood products which carry risks such as anaphylactic reaction, hepatitis and AIDS  Patient had received literature information on the procedure scheduled and all her questions were answered and fully accepts all risks.   [name] MD, 8:44 AM 10/25/2016

## 2016-10-26 ENCOUNTER — Encounter (HOSPITAL_COMMUNITY)
Admission: RE | Admit: 2016-10-26 | Discharge: 2016-10-26 | Disposition: A | Discharge status: Home / Self Care | Payer: 59 | Source: Ambulatory Visit | Attending: Obstetrics & Gynecology | Admitting: Obstetrics & Gynecology

## 2016-10-26 ENCOUNTER — Encounter (HOSPITAL_COMMUNITY): Payer: Self-pay

## 2016-10-26 DIAGNOSIS — N83202 Unspecified ovarian cyst, left side: Secondary | ICD-10-CM | POA: Insufficient documentation

## 2016-10-26 DIAGNOSIS — Z01818 Encounter for other preprocedural examination: Secondary | ICD-10-CM | POA: Diagnosis not present

## 2016-10-26 DIAGNOSIS — N83201 Unspecified ovarian cyst, right side: Secondary | ICD-10-CM | POA: Insufficient documentation

## 2016-10-26 HISTORY — DX: Personal history of urinary calculi: Z87.442

## 2016-10-26 HISTORY — DX: Constipation, unspecified: K59.00

## 2016-10-26 HISTORY — DX: Gastro-esophageal reflux disease without esophagitis: K21.9

## 2016-10-26 HISTORY — DX: Missed abortion: O02.1

## 2016-10-26 HISTORY — DX: Hyperlipidemia, unspecified: E78.5

## 2016-10-26 HISTORY — DX: Other seasonal allergic rhinitis: J30.2

## 2016-10-26 LAB — CBC
HCT: 38.8 % (ref 36.0–46.0)
Hemoglobin: 13.4 g/dL (ref 12.0–15.0)
MCH: 32.6 pg (ref 26.0–34.0)
MCHC: 34.5 g/dL (ref 30.0–36.0)
MCV: 94.4 fL (ref 78.0–100.0)
PLATELETS: 230 10*3/uL (ref 150–400)
RBC: 4.11 MIL/uL (ref 3.87–5.11)
RDW: 12.7 % (ref 11.5–15.5)
WBC: 6.8 10*3/uL (ref 4.0–10.5)

## 2016-10-26 LAB — TYPE AND SCREEN
ABO/RH(D): O POS
Antibody Screen: NEGATIVE

## 2016-10-26 LAB — ABO/RH: ABO/RH(D): O POS

## 2016-10-26 NOTE — Patient Instructions (Signed)
1. Female pelvic pain Rt pelvic pain and Dysmenorrhea.  Bilateral Ovarian cysts c/w Endometriomas.    2. Bilateral ovarian cysts  Probable Endometriomas.  No previous Dx of Endometriosis, but US findings and clinical presentation c/w Endometriosis.  Decision to proceed with Robotic LPS Ovarian Cystectomies, possible Right Oophorectomy,  conservative treatment of endometriosis.   3. Endometrial polyp Will proceed with Lyons Resection, D+C                        Patient was counseled as to the risk of surgery to include the following:  1. Infection (prohylactic antibiotics will be administered)  2. DVT/Pulmonary Embolism (prophylactic pneumo compression stockings will be used)  3.Trauma to internal organs requiring additional surgical procedure to repair any injury to     Internal organs requiring perhaps additional hospitalization days.  4.Hemmorhage requiring transfusion and blood products.  Patient had received literature information on the procedure scheduled and all her questions were answered and fully accepts all risk.

## 2016-11-03 NOTE — Anesthesia Preprocedure Evaluation (Addendum)
Anesthesia Evaluation  Patient identified by MRN, date of birth, ID band Patient awake    Reviewed: Allergy & Precautions, NPO status , Patient's Chart, lab work & pertinent test results  Airway Mallampati: III  TM Distance: >3 FB     Dental no notable dental hx. (+) Teeth Intact   Pulmonary neg pulmonary ROS,    Pulmonary exam normal breath sounds clear to auscultation       Cardiovascular negative cardio ROS Normal cardiovascular exam Rhythm:Regular Rate:Normal     Neuro/Psych negative neurological ROS  negative psych ROS   GI/Hepatic GERD  ,Diet controlled   Endo/Other  Hyperlipidemia-diet controlled  Renal/GU Hx/o renal calculi  negative genitourinary   Musculoskeletal negative musculoskeletal ROS (+)   Abdominal   Peds  Hematology negative hematology ROS (+)   Anesthesia Other Findings   Reproductive/Obstetrics Pelvic pain Bilateral ovarian cysts Endometrial mass HSV                            Anesthesia Physical Anesthesia Plan  ASA: II  Anesthesia Plan: General   Post-op Pain Management:    Induction: Intravenous  PONV Risk Score and Plan: 4 or greater and Ondansetron, Dexamethasone, Propofol, Midazolam, Scopolamine patch - Pre-op and Metaclopromide  Airway Management Planned: Oral ETT  Additional Equipment:   Intra-op Plan:   Post-operative Plan: Extubation in OR  Informed Consent: I have reviewed the patients History and Physical, chart, labs and discussed the procedure including the risks, benefits and alternatives for the proposed anesthesia with the patient or authorized representative who has indicated his/her understanding and acceptance.   Dental advisory given  Plan Discussed with: CRNA, Anesthesiologist and Surgeon  Anesthesia Plan Comments:        Anesthesia Quick Evaluation

## 2016-11-04 ENCOUNTER — Encounter (HOSPITAL_COMMUNITY)
Admission: RE | Disposition: A | Discharge status: Home / Self Care | Payer: Self-pay | Source: Ambulatory Visit | Attending: Obstetrics & Gynecology

## 2016-11-04 ENCOUNTER — Ambulatory Visit (HOSPITAL_COMMUNITY)
Admission: RE | Admit: 2016-11-04 | Discharge: 2016-11-04 | Disposition: A | Discharge status: Home / Self Care | Payer: 59 | Source: Ambulatory Visit | Attending: Obstetrics & Gynecology | Admitting: Obstetrics & Gynecology

## 2016-11-04 ENCOUNTER — Encounter (HOSPITAL_COMMUNITY): Payer: Self-pay

## 2016-11-04 ENCOUNTER — Ambulatory Visit (HOSPITAL_COMMUNITY): Payer: 59 | Admitting: Anesthesiology

## 2016-11-04 DIAGNOSIS — N83201 Unspecified ovarian cyst, right side: Secondary | ICD-10-CM | POA: Diagnosis not present

## 2016-11-04 DIAGNOSIS — N858 Other specified noninflammatory disorders of uterus: Secondary | ICD-10-CM | POA: Diagnosis not present

## 2016-11-04 DIAGNOSIS — N801 Endometriosis of ovary: Secondary | ICD-10-CM | POA: Diagnosis not present

## 2016-11-04 DIAGNOSIS — Z888 Allergy status to other drugs, medicaments and biological substances status: Secondary | ICD-10-CM | POA: Insufficient documentation

## 2016-11-04 DIAGNOSIS — N736 Female pelvic peritoneal adhesions (postinfective): Secondary | ICD-10-CM | POA: Insufficient documentation

## 2016-11-04 DIAGNOSIS — E785 Hyperlipidemia, unspecified: Secondary | ICD-10-CM | POA: Insufficient documentation

## 2016-11-04 DIAGNOSIS — R102 Pelvic and perineal pain: Secondary | ICD-10-CM | POA: Diagnosis not present

## 2016-11-04 DIAGNOSIS — N83291 Other ovarian cyst, right side: Secondary | ICD-10-CM | POA: Diagnosis not present

## 2016-11-04 DIAGNOSIS — Z881 Allergy status to other antibiotic agents status: Secondary | ICD-10-CM | POA: Insufficient documentation

## 2016-11-04 DIAGNOSIS — Z883 Allergy status to other anti-infective agents status: Secondary | ICD-10-CM | POA: Insufficient documentation

## 2016-11-04 DIAGNOSIS — K219 Gastro-esophageal reflux disease without esophagitis: Secondary | ICD-10-CM | POA: Insufficient documentation

## 2016-11-04 DIAGNOSIS — N803 Endometriosis of pelvic peritoneum: Secondary | ICD-10-CM | POA: Diagnosis not present

## 2016-11-04 DIAGNOSIS — D270 Benign neoplasm of right ovary: Secondary | ICD-10-CM | POA: Diagnosis not present

## 2016-11-04 DIAGNOSIS — N809 Endometriosis, unspecified: Secondary | ICD-10-CM | POA: Diagnosis not present

## 2016-11-04 DIAGNOSIS — R938 Abnormal findings on diagnostic imaging of other specified body structures: Secondary | ICD-10-CM | POA: Diagnosis not present

## 2016-11-04 DIAGNOSIS — Z87442 Personal history of urinary calculi: Secondary | ICD-10-CM | POA: Diagnosis not present

## 2016-11-04 DIAGNOSIS — N83202 Unspecified ovarian cyst, left side: Secondary | ICD-10-CM | POA: Diagnosis not present

## 2016-11-04 DIAGNOSIS — N840 Polyp of corpus uteri: Secondary | ICD-10-CM | POA: Diagnosis not present

## 2016-11-04 DIAGNOSIS — K5900 Constipation, unspecified: Secondary | ICD-10-CM | POA: Insufficient documentation

## 2016-11-04 HISTORY — PX: ROBOTIC ASSISTED LAPAROSCOPIC OVARIAN CYSTECTOMY: SHX6081

## 2016-11-04 HISTORY — PX: DILATATION & CURETTAGE/HYSTEROSCOPY WITH MYOSURE: SHX6511

## 2016-11-04 HISTORY — PX: ROBOTIC ASSISTED SALPINGO OOPHERECTOMY: SHX6082

## 2016-11-04 LAB — CBC
HEMATOCRIT: 35.5 % — AB (ref 36.0–46.0)
HEMOGLOBIN: 12.4 g/dL (ref 12.0–15.0)
MCH: 33.2 pg (ref 26.0–34.0)
MCHC: 34.9 g/dL (ref 30.0–36.0)
MCV: 94.9 fL (ref 78.0–100.0)
PLATELETS: 203 10*3/uL (ref 150–400)
RBC: 3.74 MIL/uL — AB (ref 3.87–5.11)
RDW: 12.6 % (ref 11.5–15.5)
WBC: 12.1 10*3/uL — AB (ref 4.0–10.5)

## 2016-11-04 LAB — PREGNANCY, URINE: PREG TEST UR: NEGATIVE

## 2016-11-04 SURGERY — ROBOTIC ASSISTED LAPAROSCOPIC OVARIAN CYSTECTOMY
Anesthesia: General | Site: Vagina | Laterality: Right

## 2016-11-04 MED ORDER — CHLOROPROCAINE HCL 1 % IJ SOLN
INTRAMUSCULAR | Status: DC | PRN
  Administered 2016-11-04: 10 mL

## 2016-11-04 MED ORDER — LIDOCAINE HCL 1 % IJ SOLN
INTRAMUSCULAR | Status: AC
  Filled 2016-11-04: qty 10

## 2016-11-04 MED ORDER — MIDAZOLAM HCL 5 MG/5ML IJ SOLN
INTRAMUSCULAR | Status: DC | PRN
  Administered 2016-11-04: 2 mg via INTRAVENOUS

## 2016-11-04 MED ORDER — BUPIVACAINE HCL (PF) 0.25 % IJ SOLN
INTRAMUSCULAR | Status: DC | PRN
  Administered 2016-11-04: 17 mL

## 2016-11-04 MED ORDER — SODIUM CHLORIDE 0.9 % IJ SOLN
INTRAMUSCULAR | Status: AC
  Filled 2016-11-04: qty 10

## 2016-11-04 MED ORDER — FENTANYL CITRATE (PF) 250 MCG/5ML IJ SOLN
INTRAMUSCULAR | Status: AC
  Filled 2016-11-04: qty 5

## 2016-11-04 MED ORDER — HYDROMORPHONE HCL 1 MG/ML IJ SOLN
0.2500 mg | INTRAMUSCULAR | Status: DC | PRN
  Administered 2016-11-04: 0.5 mg via INTRAVENOUS

## 2016-11-04 MED ORDER — PROPOFOL 10 MG/ML IV BOLUS
INTRAVENOUS | Status: AC
  Filled 2016-11-04: qty 20

## 2016-11-04 MED ORDER — CEFAZOLIN SODIUM-DEXTROSE 2-4 GM/100ML-% IV SOLN
2.0000 g | INTRAVENOUS | Status: AC
  Administered 2016-11-04: 2 g via INTRAVENOUS

## 2016-11-04 MED ORDER — PROPOFOL 10 MG/ML IV BOLUS
INTRAVENOUS | Status: DC | PRN
  Administered 2016-11-04: 150 mg via INTRAVENOUS

## 2016-11-04 MED ORDER — ROCURONIUM BROMIDE 10 MG/ML (PF) SYRINGE
PREFILLED_SYRINGE | INTRAVENOUS | Status: DC | PRN
  Administered 2016-11-04: 40 mg via INTRAVENOUS
  Administered 2016-11-04 (×2): 20 mg via INTRAVENOUS

## 2016-11-04 MED ORDER — SUGAMMADEX SODIUM 200 MG/2ML IV SOLN
INTRAVENOUS | Status: AC
  Filled 2016-11-04: qty 2

## 2016-11-04 MED ORDER — DEXAMETHASONE SODIUM PHOSPHATE 10 MG/ML IJ SOLN
INTRAMUSCULAR | Status: DC | PRN
  Administered 2016-11-04: 10 mg via INTRAVENOUS

## 2016-11-04 MED ORDER — PHENYLEPHRINE 40 MCG/ML (10ML) SYRINGE FOR IV PUSH (FOR BLOOD PRESSURE SUPPORT)
PREFILLED_SYRINGE | INTRAVENOUS | Status: DC | PRN
  Administered 2016-11-04 (×3): 80 ug via INTRAVENOUS

## 2016-11-04 MED ORDER — LACTATED RINGERS IR SOLN
Status: DC | PRN
  Administered 2016-11-04: 3000 mL

## 2016-11-04 MED ORDER — SOD CITRATE-CITRIC ACID 500-334 MG/5ML PO SOLN
30.0000 mL | ORAL | Status: AC
  Administered 2016-11-04: 30 mL via ORAL

## 2016-11-04 MED ORDER — LIDOCAINE 2% (20 MG/ML) 5 ML SYRINGE
INTRAMUSCULAR | Status: DC | PRN
  Administered 2016-11-04: 100 mg via INTRAVENOUS

## 2016-11-04 MED ORDER — BUPIVACAINE HCL (PF) 0.25 % IJ SOLN
INTRAMUSCULAR | Status: AC
  Filled 2016-11-04: qty 30

## 2016-11-04 MED ORDER — KETOROLAC TROMETHAMINE 30 MG/ML IJ SOLN
INTRAMUSCULAR | Status: AC
  Filled 2016-11-04: qty 1

## 2016-11-04 MED ORDER — CHLOROPROCAINE HCL 1 % IJ SOLN
INTRAMUSCULAR | Status: AC
  Filled 2016-11-04: qty 30

## 2016-11-04 MED ORDER — ROCURONIUM BROMIDE 100 MG/10ML IV SOLN
INTRAVENOUS | Status: AC
  Filled 2016-11-04: qty 1

## 2016-11-04 MED ORDER — ROPIVACAINE HCL 5 MG/ML IJ SOLN
INTRAMUSCULAR | Status: AC
  Filled 2016-11-04: qty 30

## 2016-11-04 MED ORDER — LIDOCAINE HCL (CARDIAC) 20 MG/ML IV SOLN
INTRAVENOUS | Status: AC
  Filled 2016-11-04: qty 5

## 2016-11-04 MED ORDER — PHENYLEPHRINE 40 MCG/ML (10ML) SYRINGE FOR IV PUSH (FOR BLOOD PRESSURE SUPPORT)
PREFILLED_SYRINGE | INTRAVENOUS | Status: AC
  Filled 2016-11-04: qty 10

## 2016-11-04 MED ORDER — SODIUM CHLORIDE 0.9 % IR SOLN
Status: DC | PRN
  Administered 2016-11-04: 3000 mL

## 2016-11-04 MED ORDER — SCOPOLAMINE 1 MG/3DAYS TD PT72
1.0000 | MEDICATED_PATCH | Freq: Once | TRANSDERMAL | Status: DC
  Administered 2016-11-04: 1.5 mg via TRANSDERMAL

## 2016-11-04 MED ORDER — ONDANSETRON HCL 4 MG/2ML IJ SOLN
INTRAMUSCULAR | Status: DC | PRN
  Administered 2016-11-04: 4 mg via INTRAVENOUS

## 2016-11-04 MED ORDER — DEXAMETHASONE SODIUM PHOSPHATE 10 MG/ML IJ SOLN
INTRAMUSCULAR | Status: AC
  Filled 2016-11-04: qty 1

## 2016-11-04 MED ORDER — SUCCINYLCHOLINE CHLORIDE 200 MG/10ML IV SOSY
PREFILLED_SYRINGE | INTRAVENOUS | Status: AC
  Filled 2016-11-04: qty 10

## 2016-11-04 MED ORDER — ONDANSETRON HCL 4 MG/2ML IJ SOLN
INTRAMUSCULAR | Status: AC
  Filled 2016-11-04: qty 2

## 2016-11-04 MED ORDER — MIDAZOLAM HCL 2 MG/2ML IJ SOLN
INTRAMUSCULAR | Status: AC
  Filled 2016-11-04: qty 2

## 2016-11-04 MED ORDER — OXYCODONE-ACETAMINOPHEN 7.5-325 MG PO TABS: 1.0000 | ORAL_TABLET | ORAL | 0 refills | Status: DC | PRN

## 2016-11-04 MED ORDER — LACTATED RINGERS IV SOLN
INTRAVENOUS | Status: DC
  Administered 2016-11-04: 12:00:00 via INTRAVENOUS
  Administered 2016-11-04: 125 mL/h via INTRAVENOUS
  Administered 2016-11-04: 09:00:00 via INTRAVENOUS

## 2016-11-04 MED ORDER — SUGAMMADEX SODIUM 200 MG/2ML IV SOLN
INTRAVENOUS | Status: DC | PRN
  Administered 2016-11-04: 200 mg via INTRAVENOUS

## 2016-11-04 MED ORDER — METOCLOPRAMIDE HCL 5 MG/ML IJ SOLN: 10.0000 mg | Freq: Once | INTRAMUSCULAR | Status: DC | PRN

## 2016-11-04 MED ORDER — KETOROLAC TROMETHAMINE 30 MG/ML IJ SOLN
30.0000 mg | Freq: Once | INTRAMUSCULAR | Status: AC
  Administered 2016-11-04: 30 mg via INTRAVENOUS

## 2016-11-04 MED ORDER — SODIUM CHLORIDE 0.9 % IJ SOLN
INTRAMUSCULAR | Status: AC
  Filled 2016-11-04: qty 50

## 2016-11-04 MED ORDER — SCOPOLAMINE 1 MG/3DAYS TD PT72
MEDICATED_PATCH | TRANSDERMAL | Status: AC
  Administered 2016-11-04: 1.5 mg via TRANSDERMAL
  Filled 2016-11-04: qty 1

## 2016-11-04 MED ORDER — SOD CITRATE-CITRIC ACID 500-334 MG/5ML PO SOLN
ORAL | Status: AC
  Administered 2016-11-04: 30 mL via ORAL
  Filled 2016-11-04: qty 15

## 2016-11-04 MED ORDER — HYDROMORPHONE HCL 1 MG/ML IJ SOLN
INTRAMUSCULAR | Status: AC
  Filled 2016-11-04: qty 1

## 2016-11-04 MED ORDER — FENTANYL CITRATE (PF) 250 MCG/5ML IJ SOLN
INTRAMUSCULAR | Status: DC | PRN
  Administered 2016-11-04: 150 ug via INTRAVENOUS
  Administered 2016-11-04: 100 ug via INTRAVENOUS

## 2016-11-04 MED ORDER — MEPERIDINE HCL 25 MG/ML IJ SOLN: 6.2500 mg | INTRAMUSCULAR | Status: DC | PRN

## 2016-11-04 SURGICAL SUPPLY — 65 items
BARRIER ADHS 3X4 INTERCEED (GAUZE/BANDAGES/DRESSINGS) ×4 IMPLANT
CANISTER SUCT 3000ML PPV (MISCELLANEOUS) ×4 IMPLANT
CATH FOLEY 3WAY  5CC 16FR (CATHETERS) ×1
CATH FOLEY 3WAY 5CC 16FR (CATHETERS) ×3 IMPLANT
CATH ROBINSON RED A/P 16FR (CATHETERS) ×4 IMPLANT
CLOTH BEACON ORANGE TIMEOUT ST (SAFETY) ×4 IMPLANT
CONT PATH 16OZ SNAP LID 3702 (MISCELLANEOUS) ×4 IMPLANT
CONTAINER PREFILL 10% NBF 60ML (FORM) ×8 IMPLANT
COVER BACK TABLE 60X90IN (DRAPES) ×8 IMPLANT
COVER TIP SHEARS 8 DVNC (MISCELLANEOUS) ×3 IMPLANT
COVER TIP SHEARS 8MM DA VINCI (MISCELLANEOUS) ×1
DECANTER SPIKE VIAL GLASS SM (MISCELLANEOUS) ×8 IMPLANT
DEFOGGER SCOPE WARMER CLEARIFY (MISCELLANEOUS) ×4 IMPLANT
DERMABOND ADVANCED (GAUZE/BANDAGES/DRESSINGS) ×1
DERMABOND ADVANCED .7 DNX12 (GAUZE/BANDAGES/DRESSINGS) ×3 IMPLANT
DEVICE MYOSURE LITE (MISCELLANEOUS) IMPLANT
DEVICE MYOSURE REACH (MISCELLANEOUS) IMPLANT
DRSG OPSITE POSTOP 3X4 (GAUZE/BANDAGES/DRESSINGS) ×4 IMPLANT
DURAPREP 26ML APPLICATOR (WOUND CARE) ×4 IMPLANT
ELECT REM PT RETURN 9FT ADLT (ELECTROSURGICAL) ×4
ELECTRODE REM PT RTRN 9FT ADLT (ELECTROSURGICAL) ×3 IMPLANT
FILTER ARTHROSCOPY CONVERTOR (FILTER) ×4 IMPLANT
GAUZE VASELINE 3X9 (GAUZE/BANDAGES/DRESSINGS) IMPLANT
GLOVE BIO SURGEON STRL SZ 6.5 (GLOVE) ×4 IMPLANT
GLOVE BIOGEL PI IND STRL 7.0 (GLOVE) ×15 IMPLANT
GLOVE BIOGEL PI INDICATOR 7.0 (GLOVE) ×5
GOWN STRL REUS W/TWL LRG LVL3 (GOWN DISPOSABLE) ×8 IMPLANT
KIT ACCESSORY DA VINCI DISP (KITS) ×1
KIT ACCESSORY DVNC DISP (KITS) ×3 IMPLANT
MANIPULATOR ADVINCU DEL 3.0 PL (MISCELLANEOUS) IMPLANT
MANIPULATOR ADVINCU DEL 3.5 PL (MISCELLANEOUS) IMPLANT
MANIPULATOR ADVINCU DEL 4.0 PL (MISCELLANEOUS) IMPLANT
PACK ROBOT WH (CUSTOM PROCEDURE TRAY) ×4 IMPLANT
PACK ROBOTIC GOWN (GOWN DISPOSABLE) ×4 IMPLANT
PACK TRENDGUARD 450 HYBRID PRO (MISCELLANEOUS) IMPLANT
PACK TRENDGUARD 600 HYBRD PROC (MISCELLANEOUS) IMPLANT
PACK VAGINAL MINOR WOMEN LF (CUSTOM PROCEDURE TRAY) ×4 IMPLANT
PAD OB MATERNITY 4.3X12.25 (PERSONAL CARE ITEMS) ×4 IMPLANT
PAD PREP 24X48 CUFFED NSTRL (MISCELLANEOUS) ×4 IMPLANT
POUCH SPECIMEN RETRIEVAL 10MM (ENDOMECHANICALS) ×4 IMPLANT
PROTECTOR NERVE ULNAR (MISCELLANEOUS) ×8 IMPLANT
SEAL ROD LENS SCOPE MYOSURE (ABLATOR) ×4 IMPLANT
SET CYSTO W/LG BORE CLAMP LF (SET/KITS/TRAYS/PACK) IMPLANT
SET IRRIG TUBING LAPAROSCOPIC (IRRIGATION / IRRIGATOR) ×4 IMPLANT
SET TRI-LUMEN FLTR TB AIRSEAL (TUBING) ×4 IMPLANT
SUT VIC AB 3-0 SH 27 (SUTURE)
SUT VIC AB 3-0 SH 27X BRD (SUTURE) IMPLANT
SUT VIC AB 4-0 PS2 27 (SUTURE) ×8 IMPLANT
SUT VICRYL 0 UR6 27IN ABS (SUTURE) ×4 IMPLANT
SYR 50ML LL SCALE MARK (SYRINGE) ×4 IMPLANT
SYSTEM CONVERTIBLE TROCAR (TROCAR) ×4 IMPLANT
TIP UTERINE 5.1X6CM LAV DISP (MISCELLANEOUS) ×4 IMPLANT
TIP UTERINE 6.7X10CM GRN DISP (MISCELLANEOUS) IMPLANT
TIP UTERINE 6.7X6CM WHT DISP (MISCELLANEOUS) IMPLANT
TIP UTERINE 6.7X8CM BLUE DISP (MISCELLANEOUS) ×4 IMPLANT
TOWEL OR 17X24 6PK STRL BLUE (TOWEL DISPOSABLE) ×12 IMPLANT
TRENDGUARD 450 HYBRID PRO PACK (MISCELLANEOUS)
TRENDGUARD 600 HYBRID PROC PK (MISCELLANEOUS)
TROCAR 12M 150ML BLUNT (TROCAR) ×4 IMPLANT
TROCAR DISP BLADELESS 8 DVNC (TROCAR) ×3 IMPLANT
TROCAR DISP BLADELESS 8MM (TROCAR) ×1
TROCAR PORT AIRSEAL 5X120 (TROCAR) ×4 IMPLANT
TUBING AQUILEX INFLOW (TUBING) ×4 IMPLANT
TUBING AQUILEX OUTFLOW (TUBING) ×4 IMPLANT
WATER STERILE IRR 1000ML POUR (IV SOLUTION) ×4 IMPLANT

## 2016-11-04 NOTE — Anesthesia Procedure Notes (Signed)
Procedure Name: Intubation Date/Time: 11/04/2016 7:31 AM Performed by: Talbot Grumbling Pre-anesthesia Checklist: Patient identified, Emergency Drugs available, Suction available and Patient being monitored Patient Re-evaluated:Patient Re-evaluated prior to inductionOxygen Delivery Method: Circle system utilized Preoxygenation: Pre-oxygenation with 100% oxygen Intubation Type: IV induction Ventilation: Mask ventilation without difficulty Laryngoscope Size: Mac and 3 Grade View: Grade I Tube type: Oral Tube size: 7.0 mm Number of attempts: 1 Airway Equipment and Method: Stylet Placement Confirmation: ETT inserted through vocal cords under direct vision,  positive ETCO2 and breath sounds checked- equal and bilateral Secured at: 21 cm Tube secured with: Tape Dental Injury: Teeth and Oropharynx as per pre-operative assessment

## 2016-11-04 NOTE — Anesthesia Postprocedure Evaluation (Signed)
Anesthesia Post Note  Patient: Ashley Marshall  Procedure(s) Performed: Procedure(s) (LRB): ROBOTIC ASSISTED LAPAROSCOPIC OVARIAN CYSTECTOMY (Left) DILATATION & CURETTAGE/HYSTEROSCOPY WITH MYOSURE (N/A) ROBOTIC ASSISTED SALPINGO OOPHORECTOMY (Right)     Patient location during evaluation: PACU Anesthesia Type: General Level of consciousness: awake Pain management: pain level controlled Vital Signs Assessment: post-procedure vital signs reviewed and stable Respiratory status: spontaneous breathing Cardiovascular status: tachycardic Postop Assessment: no signs of nausea or vomiting Anesthetic complications: no    Last Vitals:  Vitals:   11/04/16 1200 11/04/16 1215  BP: 125/61 128/74  Pulse: (!) 120 (!) 125  Resp: 16 16  Temp:      Last Pain:  Vitals:   11/04/16 1215  TempSrc:   PainSc: 1    Pain Goal: Patients Stated Pain Goal: 5 (11/04/16 5686)               Caelen Higinbotham JR,JOHN Mateo Flow

## 2016-11-04 NOTE — Transfer of Care (Signed)
Immediate Anesthesia Transfer of Care Note  Patient: Ashley Marshall  Procedure(s) Performed: Procedure(s) with comments: ROBOTIC ASSISTED LAPAROSCOPIC OVARIAN CYSTECTOMY (Left) - RNFA confirmed on 10/26/16 with chass DILATATION & CURETTAGE/HYSTEROSCOPY WITH MYOSURE (N/A) ROBOTIC ASSISTED SALPINGO OOPHORECTOMY (Right)  Patient Location: PACU  Anesthesia Type:General  Level of Consciousness:  sedated, patient cooperative and responds to stimulation  Airway & Oxygen Therapy:Patient Spontanous Breathing and Patient connected to face mask oxgen  Post-op Assessment:  Report given to PACU RN and Post -op Vital signs reviewed and stable  Post vital signs:  Reviewed and stable  Last Vitals:  Vitals:   11/04/16 0614  BP: 135/83  Pulse: (!) 108  Resp: 16  Temp: 64.3 C    Complications: No apparent anesthesia complications

## 2016-11-04 NOTE — Discharge Instructions (Addendum)
Unilateral Salpingo-Oophorectomy, Care After Refer to this sheet in the next few weeks. These instructions provide you with information on caring for yourself after your procedure. Your health care provider may also give you more specific instructions. Your treatment has been planned according to current medical practices, but problems sometimes occur. Call your health care provider if you have any problems or questions after your procedure. What can I expect after the procedure? After the procedure, it is typical to have the following:  Abdominal pain that can be controlled with pain medicine.  Vaginal spotting.  Constipation.  Follow these instructions at home:  Get plenty of rest and sleep.  Only take over-the-counter or prescription medicines as directed by your health care provider. Do not take aspirin. It can cause bleeding.  Keep incision areas clean and dry. Remove or change any bandages (dressings) only as directed by your health care provider.  Follow your health care provider's advice regarding diet.  Drink enough fluids to keep your urine clear or pale yellow.  Limit exercise and activities as directed by your health care provider. Do not lift anything heavier than 5 pounds (2.3 kg) until your health care provider approves.  Do not drive until your health care provider approves.  Do not drink alcohol until your health care provider approves.  Do not have sexual intercourse until your health care provider says it is OK.  Take your temperature twice a day and write it down.  If you become constipated, you may: ? Ask your health care provider about taking a mild laxative. ? Add more fruit and bran to your diet. ? Drink more fluids.  Follow up with your health care provider as directed. Contact a health care provider if:  You have swelling or redness in the incision area.  You develop a rash.  You feel lightheaded.  You have pain that is not controlled with  medicine.  You have pain, swelling, or redness where the IV access tube was placed. Get help right away if:  You have a fever.  You develop increasing abdominal pain.  You see pus coming out of the incision, or the incision is separating.  You notice a bad smell coming from the wound or dressing.  You have excessive vaginal bleeding.  You feel sick to your stomach (nauseous) and vomit.  You have leg or chest pain.  You have pain when you urinate.  You develop shortness of breath.  You pass out. This information is not intended to replace advice given to you by your health care provider. Make sure you discuss any questions you have with your health care provider. Document Released: 02/26/2009 Document Revised: 04/01/2016 Document Reviewed: 10/24/2012 Elsevier Interactive Patient Education  [employer]. Endometriosis Endometriosis is a condition in which the tissue that lines the uterus (endometrium) grows outside of its normal location. The tissue may grow in many locations close to the uterus, but it commonly grows on the ovaries, fallopian tubes, vagina, or bowel. When the uterus sheds the endometrium every menstrual cycle, there is bleeding wherever the endometrial tissue is located. This can cause pain because blood is irritating to tissues that are not normally exposed to it. What are the causes? The cause of endometriosis is not known. What increases the risk? You may be more likely to develop endometriosis if you:  Have a family history of endometriosis.  Have never given birth.  Started your period at age 18 or younger.  Have high levels of estrogen in  your body.  Were exposed to a certain medicine (diethylstilbestrol) before you were born (in utero).  Had low birth weight.  Were born as a twin, triplet, or other multiple.  Have a BMI of less than 25. BMI is an estimate of body fat and is calculated from height and weight.  What are the signs or  symptoms? Often, there are no symptoms of this condition. If you do have symptoms, they may:  Vary depending on where your endometrial tissue is growing.  Occur during your menstrual period (most common) or midcycle.  Come and go, or you may go months with no symptoms at all.  Stop with menopause.  Symptoms may include:  Pain in the back or abdomen.  Heavier bleeding during periods.  Pain during sex.  Painful bowel movements.  Infertility.  Pelvic pain.  Bleeding more than once a month.  How is this diagnosed? This condition is diagnosed based on your symptoms and a physical exam. You may have tests, such as:  Blood tests and urine tests. These may be done to help rule out other possible causes of your symptoms.  Ultrasound, to look for abnormal tissues.  An X-ray of the lower bowel (barium enema).  An ultrasound that is done through the vagina (transvaginally).  CT scan.  MRI.  Laparoscopy. In this procedure, a lighted, pencil-sized instrument called a laparoscope is inserted into your abdomen through an incision. The laparoscope allows your health care provider to look at the organs inside your body and check for abnormal tissue to confirm the diagnosis. If abnormal tissue is found, your health care provider may remove a small piece of tissue (biopsy) to be examined under a microscope.  How is this treated? Treatment for this condition may include:  Medicines to relieve pain, such as NSAIDs.  Hormone therapy. This involves using artificial (synthetic) hormones to reduce endometrial tissue growth. Your health care provider may recommend using a hormonal form of birth control, or other medicines.  Surgery. This may be done to remove abnormal endometrial tissue. ? In some cases, tissue may be removed using a laparoscope and a laser (laparoscopic laser treatment). ? In severe cases, surgery may be done to remove the fallopian tubes, uterus, and ovaries  (hysterectomy).  Follow these instructions at home:  Take over-the-counter and prescription medicines only as told by your health care provider.  Do not drive or use heavy machinery while taking prescription pain medicine.  Try to avoid activities that cause pain, including sexual activity.  Keep all follow-up visits as told by your health care provider. This is important. Contact a health care provider if:  You have pain in the area between your hip bones (pelvic area) that occurs: ? Before, during, or after your period. ? In between your period and gets worse during your period. ? During or after sex. ? With bowel movements or urination, especially during your period.  You have problems getting pregnant.  You have a fever. Get help right away if:  You have severe pain that does not get better with medicine.  You have severe nausea and vomiting, or you cannot eat without vomiting.  You have pain that affects only the lower, right side of your abdomen.  You have abdominal pain that gets worse.  You have abdominal swelling.  You have blood in your stool. This information is not intended to replace advice given to you by your health care provider. Make sure you discuss any questions you have with your  health care provider. Document Released: 04/29/2000 Document Revised: 02/05/2016 Document Reviewed: 10/03/2015 Elsevier Interactive Patient Education  2018 [place] INSTRUCTIONS: Laparoscopy  The following instructions have been prepared to help you care for yourself upon your return home today.  Wound care:  Do not get the incision wet for the first 24 hours. The incision should be kept clean and dry.  The Band-Aids or dressings may be removed the day after surgery.  Should the incision become sore, red, and swollen after the first week, check with your doctor.  Personal hygiene:  Shower the day after your procedure.  Activity and limitations:   Do NOT drive or operate any equipment today.  Do NOT lift anything more than 15 pounds for 2-3 weeks after surgery.  Do NOT rest in bed all day.  Walking is encouraged. Walk each day, starting slowly with 5-minute walks 3 or 4 times a day. Slowly increase the length of your walks.  Walk up and down stairs slowly.  Do NOT do strenuous activities, such as golfing, playing tennis, bowling, running, biking, weight lifting, gardening, mowing, or vacuuming for 2-4 weeks. Ask your doctor when it is okay to start.  Diet: Eat a light meal as desired this evening. You may resume your usual diet tomorrow.  Return to work: This is dependent on the type of work you do. For the most part you can return to a desk job within a week of surgery. If you are more active at work, please discuss this with your doctor.  What to expect after your surgery: You may have a slight burning sensation when you urinate on the first day. You may have a very small amount of blood in the urine. Expect to have a small amount of vaginal discharge/light bleeding for 1-2 weeks. It is not unusual to have abdominal soreness and bruising for up to 2 weeks. You may be tired and need more rest for about 1 week. You may experience shoulder pain for 24-72 hours. Lying flat in bed may relieve it.  Call your doctor for any of the following:  Develop a fever of 100.4 or greater  Inability to urinate 6 hours after discharge from hospital  Severe pain not relieved by pain medications  Persistent of heavy bleeding at incision site  Redness or swelling around incision site after a week  Increasing nausea or vomiting  Patient Signature________________________________________ Nurse Signature_________________________________________    Post Anesthesia Home Care Instructions  Activity: Get plenty of rest for the remainder of the day. A responsible individual must stay with you for 24 hours following the procedure.  For the next 24  hours, DO NOT: -Drive a car -Paediatric nurse -Drink alcoholic beverages -Take any medication unless instructed by your physician -Make any legal decisions or sign important papers.  Meals: Start with liquid foods such as gelatin or soup. Progress to regular foods as tolerated. Avoid greasy, spicy, heavy foods. If nausea and/or vomiting occur, drink only clear liquids until the nausea and/or vomiting subsides. Call your physician if vomiting continues.  Special Instructions/Symptoms: Your throat may feel dry or sore from the anesthesia or the breathing tube placed in your throat during surgery. If this causes discomfort, gargle with warm salt water. The discomfort should disappear within 24 hours.  If you had a scopolamine patch placed behind your ear for the management of post- operative nausea and/or vomiting:  1. The medication in the patch is effective for 72 hours, after which it should  be removed.  Wrap patch in a tissue and discard in the trash. Wash hands thoroughly with soap and water. 2. You may remove the patch earlier than 72 hours if you experience unpleasant side effects which may include dry mouth, dizziness or visual disturbances. 3. Avoid touching the patch. Wash your hands with soap and water after contact with the patch.    NO IBUPROFEN PRODUCTS (MOTRIN, ADVIL) OR ALEVE UNTIL 4:30PM TODAY.

## 2016-11-04 NOTE — Op Note (Signed)
Operative Note  11/04/2016  10:34 AM  PATIENT:  Ashley Marshall  46 y.o. female  PRE-OPERATIVE DIAGNOSIS:  Pelvic pain, bilateral ovarian cysts, intrauterine lesion 1.5 cm consistent with polyp  POST-OPERATIVE DIAGNOSIS:  MODERATE PELVIC ENDOMETRIOSIS, WITH LEFT OVARIAN ENDOMETRIOMA, RIGHT COMPLEX OVARIAN CYST WITH INCREASED VASCULARITY, BOWEL ADHESIONS IN PELVIS.  Thickened Endometrium, but no Polyp seen.  PROCEDURE:  Procedure(s): ROBOTIC ASSISTED LAPAROSCOPIC LEFT OVARIAN CYSTECTOMY, RIGHT SALPINGO-OOPHORECTOMY, LYSIS OF BOWEL ADHESIONS IN PELVIS.  HYSTEROSCOPY WITH MYOSURE RESECTION, DILATATION & CURETTAGE   SURGEON:  Surgeon(s): [name], MD  ANESTHESIA:   general  DESCRIPTION OF OPERATION:  Under general anesthesia with endotracheal intubation the patient is in lithotomy position. She is prepped with DuraPrep on the abdomen and with Betadine on the suprapubic, vulvar and vaginal areas.  The patient is draped as usual. Timeout is done.  The vaginal exam reveals an intermediate uterus normal volume, mobile, bilateral ovarian cysts.  The speculum is inserted in the vagina and the anterior lip of the cervix is grasped with a tenaculum.  A paracervical block is done with Nesacaine 1% a total of 20 cc at 4 and 8:00.  Dilation of the cervix with Hegar dilators up to #27 without difficulty the hysterometry is 8 cm.  Insertion of the hysteroscope in the intrauterine cavity. Good visualization revealing normal ostia, slightly thickened endometrium on the left aspect of the cavity but no polyps seen.  The light Myosure is used to resect the slightly thickened endometrium.  Pictures were taken before and after resection.  We then removed the hysteroscope and completed the intervention by a systematic curettage of the intrauterine cavity on all walls with a sharp curette. Both specimens are sent together to pathology.  Hemostasis was adequate.  The speculum was replaced by a weighted  speculum.  A #8 Rumi with the small Koe-ring were easily put in place.  All other instruments were removed. The Foley was inserted in the bladder. We then went to the abdomen.      The supraumbilical area was infiltrated with Marcaine one quarter plain. A 1.5 cm incision was done at that level with the scalpel. The aponeurosis was grasped with cokers. The aponeurosis was opened with Mayo scissors. The parietal peritoneum was opened bluntly with the finger. A pursestring stitch of Vicryl 0 was done on the aponeurosis. The Sheryle Hail was inserted at that level under direct vision. A pneumoperitoneum was created with CO2. The camera was inserted and visualization of the anterior abdominal wall revealed no adhesion.  A semicircular configuration was used for ports placement.  All sites were infiltrated with Marcaine one quarter plain. Small incisions were made with the scalpel. All ports were inserted under direct vision.  A 5 mm assistant port was inserted at the left upper site, the 3 other ports were robotic ports.  The patient was placed in deep Trendelenburg.  The robot was docked from the right side.  Robotic instruments were inserted under direct vision with the Endo Shears scissor in the first arm, the PK in the second arm and the fenestrated clamp in the third arm. We went to the console.      Inspection of the abdominopelvic cavities revealed a normal abdomen with a normal appendix, moderate pelvic endometriosis with partial occlusion of the posterior cul-de-sac from adhesions with the bowels, the left ovary presented an endometrioma and was adherent to the left pelvic wall and to the bowels.  The right ovary presented an exophytic cyst measuring about  4 cm with increased vascularity.  A small lesion of endometriosis was present at the surface of the right ovary.  The uterus and bilateral tubes appeared normal. Pictures were taken of all those structures.  Given the appearance of the right ovarian cyst, the  decision was made to proceed with a right salpingo-oophorectomy.  We started with lysis of adhesions, releasing the bowels from the posterior cul-de-sac and releasing the left ovary completely.  We then opened the left ovary contralateral to the infundibulopelvic ligament, over the endometrioma.  The endometrioma cyst wall was completely removed with traction and countertraction using a [place] robotic clamp and the fenestrated clamp.  We completed hemostasis at the edges and at the bed of the cyst with the PK.  We visualized good peristalsis of both ureters in normal anatomic position.  We then proceeded with the right salpingo-oophorectomy.  The right infundibulopelvic ligament was cauterized and sectioned.  We then cauterized and sectioned the mesosalpinx right below the right tube.  We continued to cauterized and sectioned just below the right ovary until we reached the right utero-ovarian ligament.  The right utero-ovarian ligament was cauterized and sectioned.  The proximal part of the tube was cauterized and sectioned as well.  That completely freed the right tube and ovary.  Both the specimen from the left ovarian cyst wall and the right tube and ovary were put in the posterior cul-de-sac.  No other lesion of endometriosis was visible.  We irrigated and suctioned the pelvic cavity during the procedure as necessary.  Hemostasis was adequate at all levels.  We therefore removed all robotic instruments under direct vision.  We undocked the robot.  We went to laparoscopy time.  The 8 mm camera was used in the upper right robotic port. The 2 specimens were put in the Endobag and sent to pathology.  We removed the deep Trendelenburg and irrigated and suctioned the pelvic cavity.  We then applied Interceed around the left ovary and towards the right posterior cutis sac.  All laparoscopic instruments were removed under direct vision.  The ports were removed under direct vision. The CO2 was evacuated.  The  supraumbilical incision was closed by attaching the pursestring stitch at the aponeurosis.  All incisions were closed with a subcuticular suture of Vicryl 4-0 and Dermabond was added on all incisions.  The vaginal instruments were removed.  The Foley was removed.  The patient was brought to recovery room in good and stable status.  ESTIMATED BLOOD LOSS: 100 CC   Intake/Output Summary (Last 24 hours) at 11/04/16 1034 Last data filed at 11/04/16 0946  Gross per 24 hour  Intake             1500 ml  Output              700 ml  Net              800 ml     BLOOD ADMINISTERED:none   LOCAL MEDICATIONS USED:  MARCAINE 0.25% AND NESACAINE 1%  SPECIMEN:  Source of Specimen:  RIGHT OVARY AND TUBE, LEFT OVARIAN CYST WALL, ENDOMETRIAL CURETTINGS AND RESECTION SPECIMENS  DISPOSITION OF SPECIMEN:  PATHOLOGY  COUNTS:  YES  PLAN OF CARE: Transfer to PACU  Marie-Lyne LavoieMD10:34 AM

## 2016-11-04 NOTE — H&P (Addendum)
Ashley Marshall is an 46 y.o. female.  G3P2A1  RP:  Rt Pelvic pain, Bilateral Ovarian Cysts, IU lesion c/w Polyp  HPI:   No change x last visit.  No current vaginal bleeding.  Continued Rt pelvic pain.  Pertinent Gynecological History: Menses: flow is moderate Blood transfusions: none  Contraception:  Progestin only pill Sexually transmitted diseases: H/O HPV Last mammogram: normal Last pap: normal  OB History: G3P2A1   Menstrual History:  Patient's last menstrual period was 10/31/2016 (exact date).    Past Medical History:  Diagnosis Date  . Constipation   . GERD (gastroesophageal reflux disease)    no meds - diet controlled  . Herpes   . History of kidney stones    passed stones - no surgery required  . HPV in female   . Hyperlipidemia    diet controlled - no meds  . Missed ab    no surgery required  . Seasonal allergies   . SVD (spontaneous vaginal delivery)    x 2    Past Surgical History:  Procedure Laterality Date  . COLONOSCOPY     x 2 -hx polyps  . COLPOSCOPY    . RETINAL DETACHMENT SURGERY Left 2000  . WISDOM TOOTH EXTRACTION      Family History  Problem Relation Age of Onset  . Diabetes Father   . Breast cancer Paternal Aunt   . Cancer Paternal Uncle        pancreatic  . Diabetes Paternal Grandmother   . Cancer Paternal Grandmother        stomach   . Cancer Paternal Grandfather        colon    Social History:  reports that she has never smoked. She has never used smokeless tobacco. She reports that she drinks about 1.2 oz of alcohol per week . She reports that she does not use drugs.  Allergies:  Allergies  Allergen Reactions  . Ambien [Zolpidem Tartrate] Other (See Comments)    hallucinations  . Flagyl [Metronidazole]     neuropathy  . Septra [Sulfamethoxazole-Trimethoprim] Other (See Comments)    thrush    Prescriptions Prior to Admission  Medication Sig Dispense Refill Last Dose  . Multiple Vitamin (MULTIVITAMIN) tablet Take 1  tablet by mouth daily.   Past Month at Unknown time  . valACYclovir (VALTREX) 500 MG tablet Take 500 mg by mouth daily as needed.    11/04/2016 at 0500  . norethindrone (ORTHO MICRONOR) 0.35 MG tablet Take 1 tablet (0.35 mg total) by mouth daily. (Patient not taking: Reported on 10/25/2016) 3 Package 4 Not Taking    ROS Neg  Blood pressure 135/83, pulse (!) 108, temperature 98.1 F (36.7 C), temperature source Oral, resp. rate 16, last menstrual period 10/31/2016, SpO2 100 %. Physical Exam  Results for orders placed or performed during the hospital encounter of 11/04/16 (from the past 24 hour(s))  Pregnancy, urine     Status: None   Collection Time: 11/04/16  6:00 AM  Result Value Ref Range   Preg Test, Ur NEGATIVE NEGATIVE    Pelvic [facility] at [facility] 09/21/2016: T/V and T/A  Anteverted uterus with prominent endometrium with echogenic focus at 15 x 12 mm.  Endometrial line otherwise 8.2 mm.  Rt Ovary: Thick walled simple cyst 3.1 cm , pos Doppler at periphery.  Thin walled cyst with low level echoes 4.4 cm, feeder vessel on Doppler.  Lt Ovary: thin walled cyst with low level echoes 3.1 cm, neg Doppler.  Assessment/Plan:  1. Female pelvic pain Rt pelvic pain and Dysmenorrhea. Bilateral Ovarian cysts c/w Endometriomas.   2. Bilateral ovarian cysts  Probable Endometriomas. No previous Dx of Endometriosis, but US findings and clinical presentation c/w Endometriosis. Decision to proceed with Robotic LPS Ovarian Cystectomies, possible Right Oophorectomy,  conservative treatment of endometriosis.   3. Endometrial polyp Will proceed with [place] Resection, D+C                        Patient was counseled as to the risk of surgery to include the following:  1. Infection (prohylactic antibiotics will be administered)  2. DVT/Pulmonary Embolism (prophylactic pneumo compression stockings will be used)  3.Trauma to internal organs requiring additional surgical procedure to repair any injury to      Internal organs requiring perhaps additional hospitalization days.  4.Hemmorhage requiring transfusion and blood products which carry risks such as anaphylactic reaction, hepatitis and AIDS  Patient had received literature information on the procedure scheduled and all her questions were answered and fully accepts all risks.  Informed consent signed this morning.  Ashley Marshall 11/04/2016, 7:13 AM

## 2016-11-05 ENCOUNTER — Encounter (HOSPITAL_COMMUNITY): Payer: Self-pay | Admitting: Obstetrics & Gynecology

## 2016-11-14 DIAGNOSIS — H18893 Other specified disorders of cornea, bilateral: Secondary | ICD-10-CM | POA: Diagnosis not present

## 2016-11-14 DIAGNOSIS — H04123 Dry eye syndrome of bilateral lacrimal glands: Secondary | ICD-10-CM | POA: Diagnosis not present

## 2016-11-14 DIAGNOSIS — H018 Other specified inflammations of eyelid: Secondary | ICD-10-CM | POA: Diagnosis not present

## 2016-11-15 MED FILL — PREDNISOLONE AC 1% EYE DROP: 1 | 12 days supply | Qty: 5 | Fill #0

## 2016-11-15 MED FILL — RESTASIS MULTIDOSE 0.05% EY: 0.05 | 30 days supply | Qty: 6 | Fill #0

## 2016-11-21 DIAGNOSIS — R000 Tachycardia, unspecified: Secondary | ICD-10-CM | POA: Diagnosis not present

## 2016-11-21 DIAGNOSIS — M25562 Pain in left knee: Secondary | ICD-10-CM | POA: Diagnosis not present

## 2016-11-21 DIAGNOSIS — E7800 Pure hypercholesterolemia, unspecified: Secondary | ICD-10-CM | POA: Diagnosis not present

## 2016-11-21 DIAGNOSIS — H04123 Dry eye syndrome of bilateral lacrimal glands: Secondary | ICD-10-CM | POA: Diagnosis not present

## 2016-11-21 DIAGNOSIS — M25561 Pain in right knee: Secondary | ICD-10-CM | POA: Diagnosis not present

## 2016-11-21 DIAGNOSIS — R5383 Other fatigue: Secondary | ICD-10-CM | POA: Diagnosis not present

## 2016-11-22 ENCOUNTER — Telehealth: Payer: Self-pay

## 2016-11-22 NOTE — Telephone Encounter (Signed)
SENT NOTES TO SCHEDULING 

## 2016-11-23 DIAGNOSIS — H40051 Ocular hypertension, right eye: Secondary | ICD-10-CM | POA: Diagnosis not present

## 2016-11-25 ENCOUNTER — Ambulatory Visit (INDEPENDENT_AMBULATORY_CARE_PROVIDER_SITE_OTHER): Payer: 59 | Admitting: Obstetrics & Gynecology

## 2016-11-25 ENCOUNTER — Encounter: Payer: Self-pay | Admitting: Obstetrics & Gynecology

## 2016-11-25 VITALS — BP 120/82

## 2016-11-25 DIAGNOSIS — R000 Tachycardia, unspecified: Secondary | ICD-10-CM

## 2016-11-25 DIAGNOSIS — Z09 Encounter for follow-up examination after completed treatment for conditions other than malignant neoplasm: Secondary | ICD-10-CM

## 2016-11-25 NOTE — Progress Notes (Signed)
    Ashley Marshall 16-Feb-1971 [id number]        47 y.o.  [id number]   RP:  Post op Robotic LOA/Rt SO, Lt Ovarian Cystectomy/HSC D+C  HPI:  Very good postop evolution.  No longer having Rt pelvic pain.  No abdo-pelvic pain.  Incisions well closed.  No fever.  Mictions/BMs wnl.  Had Tachycardia postop, occasional palpitations under investigation with Fam MD, referral to Cardio organized.  Past medical history,surgical history, problem list, medications, allergies, family history and social history were all reviewed and documented in the EPIC chart.  Directed ROS with pertinent positives and negatives documented in the history of present illness/assessment and plan.  Exam:  Vitals:   11/25/16 0831  BP: 120/82   General appearance:  Normal  Abdo:  Soft, NT, Incisions intact  Gyn exam:  Vulva normal.  Bimanual exam:  Uterus AV, normal volume, NT.  No adnexal mass, NT.  Patho:  1. Endometrium, curettage - DEGENERATING SECRETORY ENDOMETRIUM. - NO HYPERPLASIA OR MALIGNANCY. 2. Ovary and fallopian tube, right, and left ovarian cyst wall - RIGHT OVARY WITH SEROMUCINOUS CYSTADENOMA WITH FOCAL EPITHELIAL PROLIFERATION (<10%). - ENDOMETRIOSIS. - BENIGN FALLOPIAN TUBE.  Assessment/Plan:  46 y.o. [id number]  1. Status post gynecological surgery, follow-up exam Good postop evolution.  Patho report reviewed.  Reassured.  Decision to observe menstrual cycle at this point, if irregular, will consider restarting Progestin-pill.  2. Tachycardia Referred to Cardio by Fam MD.  Dry eye Syndrome.  R/O Auto-immune etiology recommended.  Counseling on above issues >50% x 15 minutes.  [name] MD, 8:47 AM 11/25/2016

## 2016-11-25 NOTE — Patient Instructions (Signed)
1. Status post gynecological surgery, follow-up exam Good postop evolution.  Patho report reviewed.  Reassured.  Decision to observe menstrual cycle at this point, if irregular, will consider restarting Progestin-pill.  2. Tachycardia Referred to Cardio by Fam MD.  Dry eye Syndrome.  R/O Auto-immune etiology recommended.  Ashley Marshall, it was good to see you and how well you healed from surgery!

## 2016-11-28 ENCOUNTER — Telehealth: Payer: Self-pay | Admitting: Student

## 2016-11-28 NOTE — Telephone Encounter (Signed)
Received records from [place] for appointment on 12/13/16 with Bernerd Pho, [place].  Records put with Brittany's schedule for 12/13/16. lp

## 2016-11-30 ENCOUNTER — Encounter: Payer: Self-pay | Admitting: Anesthesiology

## 2016-12-05 MED FILL — PREDNISOLONE AC 1% EYE DROP: 1 | 12 days supply | Qty: 5 | Fill #1

## 2016-12-07 ENCOUNTER — Encounter: Payer: Self-pay | Admitting: Cardiology

## 2016-12-07 ENCOUNTER — Ambulatory Visit (INDEPENDENT_AMBULATORY_CARE_PROVIDER_SITE_OTHER): Payer: 59 | Admitting: Cardiology

## 2016-12-07 VITALS — BP 132/86 | HR 100 | Ht 63.0 in | Wt 140.2 lb

## 2016-12-07 DIAGNOSIS — R0602 Shortness of breath: Secondary | ICD-10-CM

## 2016-12-07 DIAGNOSIS — R000 Tachycardia, unspecified: Secondary | ICD-10-CM | POA: Diagnosis not present

## 2016-12-07 DIAGNOSIS — R55 Syncope and collapse: Secondary | ICD-10-CM | POA: Diagnosis not present

## 2016-12-07 NOTE — Patient Instructions (Signed)
Medication Instructions:  Your physician recommends that you continue on your current medications as directed. Please refer to the Current Medication list given to you today.   Labwork: D-DIMER TODAY  Testing/Procedures: Your physician has requested that you have an echocardiogram. Echocardiography is a painless test that uses sound waves to create images of your heart. It provides your doctor with information about the size and shape of your heart and how well your heart's chambers and valves are working. This procedure takes approximately one hour. There are no restrictions for this procedure.    Follow-Up: Your physician wants you to follow-up in: AS NEEDED  Any Other Special Instructions Will Be Listed Below (If Applicable).     If you need a refill on your cardiac medications before your next appointment, please call your pharmacy.

## 2016-12-07 NOTE — Progress Notes (Signed)
Cardiology Office Note:    Date:  12/07/2016   ID:  Ashley Marshall, DOB 22-Nov-1970, MRN [id number]  PCP:  Darcus Austin, MD  Cardiologist:  Candee Furbish, MD    Referring MD: Darcus Austin, MD     History of Present Illness:    Ashley Marshall is a 46 y.o. female here for the evaluation of tachycardia and dyspnea with mild exertion at the request of Dr. Darcus Austin  She has noted occasional palpitations with heart rates that can increase up to 140. Postoperatively after a endometriosis surgery, her tachycardia was noted as well as shortness of breath over the past couple months. Denied any chest discomfort.  Non smoker. Palpitations. No chest pain. When standing up, felt dark vision.   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 Has been prone in teens to passing out. June 22.   IVF given. Hgb stable.   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   Past Medical History:  Diagnosis Date  . Constipation   . GERD (gastroesophageal reflux disease)    no meds - diet controlled  . Herpes   . History of kidney stones    passed stones - no surgery required  . HPV in female   . Hyperlipidemia    diet controlled - no meds  . Missed ab    no surgery required  . Seasonal allergies   . SVD (spontaneous vaginal delivery)    x 2    Past Surgical History:  Procedure Laterality Date  . COLONOSCOPY     x 2 -hx polyps  . COLPOSCOPY    . DILATATION & CURETTAGE/HYSTEROSCOPY WITH MYOSURE N/A 11/04/2016   Procedure: DILATATION & CURETTAGE/HYSTEROSCOPY WITH MYOSURE;  Surgeon: [name], MD;  Location: [facility];  Service: Gynecology;  Laterality: N/A;  . RETINAL DETACHMENT SURGERY Left 2000  . ROBOTIC ASSISTED LAPAROSCOPIC OVARIAN CYSTECTOMY Left 11/04/2016   Procedure: ROBOTIC ASSISTED LAPAROSCOPIC OVARIAN CYSTECTOMY;  Surgeon: [name], MD;  Location: [facility];  Service: Gynecology;  Laterality: Left;  RNFA confirmed on 10/26/16 with chass  .  ROBOTIC ASSISTED SALPINGO OOPHERECTOMY Right 11/04/2016   Procedure: ROBOTIC ASSISTED SALPINGO OOPHORECTOMY;  Surgeon: [name], MD;  Location: [facility];  Service: Gynecology;  Laterality: Right;  . WISDOM TOOTH EXTRACTION      Current Medications: Current Meds  Medication Sig  . prednisoLONE acetate (PRED FORTE) 1 % ophthalmic suspension Apply 1 drop to eye 4 (four) times daily.     Allergies:   Ambien [zolpidem tartrate]; Flagyl [metronidazole]; and Septra [sulfamethoxazole-trimethoprim]   Social History   Social History  . Marital status: Married    Spouse name: N/A  . Number of children: N/A  . Years of education: N/A   Social History Main Topics  . Smoking status: Never Smoker  . Smokeless tobacco: Never Used  . Alcohol use 1.2 oz/week    2 Glasses of wine per week  . Drug use: No  . Sexual activity: Yes    Partners: Male     Comment: patient's husband - vasectomy    Other Topics Concern  . Not on file   Social History Narrative  . No narrative on file     Family History: The patient's family history includes Breast cancer in her paternal aunt; Cancer in her paternal grandfather, paternal grandmother, and paternal uncle; Diabetes in her father and paternal grandmother. ROS:   Please see the history of present illness.  All other systems reviewed and are negative.  EKGs/Labs/Other Studies Reviewed:    The following studies were reviewed today: Prior office note, lab work reviewed. CRP was 2.8. Sedimentation rate 22, slightly elevated, TSH 1.6 normal. Sjogren antibodies were normal. Creatinine 0.67, LDL 161.  EKG:  EKG is not ordered today.  Prior EKG from 11/21/16 shows sinus rhythm heart rate 87 with no other abnormalities. Personally viewed.  Recent Labs: 11/04/2016: Hemoglobin 12.4; Platelets 203  Recent Lipid Panel No results found for: CHOL, TRIG, HDL, CHOLHDL, VLDL, LDLCALC, LDLDIRECT  Physical Exam:    VS:  BP 132/86 (BP Location: Left Arm)    Pulse 100   Ht 5\' 3"  (1.6 m)   Wt 140 lb 3.2 oz (63.6 kg)   BMI 24.84 kg/m     Wt Readings from Last 3 Encounters:  12/07/16 140 lb 3.2 oz (63.6 kg)  10/26/16 140 lb 6 oz (63.7 kg)  09/14/16 138 lb (62.6 kg)     GEN:  Well nourished, well developed in no acute distress HEENT: Normal NECK: No JVD; No carotid bruits LYMPHATICS: No lymphadenopathy CARDIAC: RRR, no murmurs, rubs, gallops RESPIRATORY:  Clear to auscultation without rales, wheezing or rhonchi  ABDOMEN: Soft, non-tender, non-distended MUSCULOSKELETAL:  No edema; No deformity  SKIN: Warm and dry NEUROLOGIC:  Alert and oriented x 3 PSYCHIATRIC:  Normal affect   ASSESSMENT:    1. Shortness of breath   2. Sinus tachycardia   3. Near syncope    PLAN:    In order of problems listed above:  Shortness of breath, sinus tachycardia, recent surgery  - First, I will check an echocardiogram to ensure proper structure and function of her heart. She did mention that she was having some shortness of breath preceding her endometriosis surgery.  - Second, I will check a d-dimer just to ensure that it is not markedly elevated or indicative of thrombosis. Pulmonary embolism if present could cause symptoms similar to what she is experiencing. She has had recent surgery.  Near syncope  - She has had a history in the past of near syncopal episode/syncope especially when in her youth. Her symptoms sound orthostatic in origin. Continue to encourage salt liberalization and hydration.     Medication Adjustments/Labs and Tests Ordered: Current medicines are reviewed at length with the patient today.  Concerns regarding medicines are outlined above.  Orders Placed This Encounter  Procedures  . D-Dimer, Quantitative  . ECHOCARDIOGRAM COMPLETE   No orders of the defined types were placed in this encounter.   Signed, Candee Furbish, MD  12/07/2016 3:59 PM    [facility]

## 2016-12-08 DIAGNOSIS — H40051 Ocular hypertension, right eye: Secondary | ICD-10-CM | POA: Diagnosis not present

## 2016-12-08 LAB — D-DIMER, QUANTITATIVE (NOT AT ARMC): D-DIMER: 0.34 mg{FEU}/L (ref 0.00–0.49)

## 2016-12-09 ENCOUNTER — Ambulatory Visit (HOSPITAL_COMMUNITY)
Admission: RE | Admit: 2016-12-09 | Discharge: 2016-12-09 | Disposition: A | Discharge status: Home / Self Care | Payer: 59 | Source: Ambulatory Visit | Attending: Cardiology | Admitting: Cardiology

## 2016-12-09 DIAGNOSIS — H40051 Ocular hypertension, right eye: Secondary | ICD-10-CM | POA: Diagnosis not present

## 2016-12-09 DIAGNOSIS — R0602 Shortness of breath: Secondary | ICD-10-CM | POA: Diagnosis not present

## 2016-12-09 DIAGNOSIS — R000 Tachycardia, unspecified: Secondary | ICD-10-CM | POA: Insufficient documentation

## 2016-12-09 NOTE — Progress Notes (Signed)
  Echocardiogram 2D Echocardiogram has been performed.  Ashley Marshall L Androw 12/09/2016, 9:38 AM

## 2016-12-11 DIAGNOSIS — H15101 Unspecified episcleritis, right eye: Secondary | ICD-10-CM | POA: Diagnosis not present

## 2016-12-12 ENCOUNTER — Encounter: Payer: Self-pay | Admitting: Cardiology

## 2016-12-12 NOTE — Telephone Encounter (Signed)
Ashley Marshall. She wants you to call her desk number. Candee Furbish, MD

## 2016-12-13 ENCOUNTER — Ambulatory Visit: Payer: 59 | Admitting: Student

## 2016-12-13 MED FILL — RESTASIS MULTIDOSE 0.05% EY: 0.05 | 30 days supply | Qty: 6 | Fill #1

## 2016-12-15 DIAGNOSIS — H04123 Dry eye syndrome of bilateral lacrimal glands: Secondary | ICD-10-CM | POA: Diagnosis not present

## 2016-12-15 DIAGNOSIS — H018 Other specified inflammations of eyelid: Secondary | ICD-10-CM | POA: Diagnosis not present

## 2016-12-15 DIAGNOSIS — H18893 Other specified disorders of cornea, bilateral: Secondary | ICD-10-CM | POA: Diagnosis not present

## 2016-12-16 MED FILL — ERYTHROMYCIN EYE OINTMENT: 5 | 10 days supply | Qty: 4 | Fill #0

## 2017-01-13 MED FILL — RESTASIS MULTIDOSE 0.05% EY: 0.05 | 30 days supply | Qty: 6 | Fill #2

## 2017-01-16 DIAGNOSIS — K3580 Unspecified acute appendicitis: Secondary | ICD-10-CM | POA: Diagnosis not present

## 2017-01-24 DIAGNOSIS — M79643 Pain in unspecified hand: Secondary | ICD-10-CM | POA: Diagnosis not present

## 2017-01-24 DIAGNOSIS — M79642 Pain in left hand: Secondary | ICD-10-CM | POA: Diagnosis not present

## 2017-01-24 DIAGNOSIS — M2550 Pain in unspecified joint: Secondary | ICD-10-CM | POA: Diagnosis not present

## 2017-01-24 DIAGNOSIS — M542 Cervicalgia: Secondary | ICD-10-CM | POA: Diagnosis not present

## 2017-01-24 DIAGNOSIS — R768 Other specified abnormal immunological findings in serum: Secondary | ICD-10-CM | POA: Diagnosis not present

## 2017-01-24 DIAGNOSIS — H04123 Dry eye syndrome of bilateral lacrimal glands: Secondary | ICD-10-CM | POA: Diagnosis not present

## 2017-01-24 DIAGNOSIS — M79641 Pain in right hand: Secondary | ICD-10-CM | POA: Diagnosis not present

## 2017-01-25 DIAGNOSIS — R140 Abdominal distension (gaseous): Secondary | ICD-10-CM | POA: Diagnosis not present

## 2017-01-25 DIAGNOSIS — K210 Gastro-esophageal reflux disease with esophagitis: Secondary | ICD-10-CM | POA: Diagnosis not present

## 2017-01-25 DIAGNOSIS — R1011 Right upper quadrant pain: Secondary | ICD-10-CM | POA: Diagnosis not present

## 2017-01-25 DIAGNOSIS — Z8371 Family history of colonic polyps: Secondary | ICD-10-CM | POA: Diagnosis not present

## 2017-01-25 MED FILL — PANTOPRAZOLE SOD DR 40 MG T: 40 | 30 days supply | Qty: 30 | Fill #0

## 2017-02-07 DIAGNOSIS — M542 Cervicalgia: Secondary | ICD-10-CM | POA: Diagnosis not present

## 2017-02-07 DIAGNOSIS — R768 Other specified abnormal immunological findings in serum: Secondary | ICD-10-CM | POA: Diagnosis not present

## 2017-02-07 DIAGNOSIS — H04123 Dry eye syndrome of bilateral lacrimal glands: Secondary | ICD-10-CM | POA: Diagnosis not present

## 2017-02-07 DIAGNOSIS — M79643 Pain in unspecified hand: Secondary | ICD-10-CM | POA: Diagnosis not present

## 2017-02-08 ENCOUNTER — Encounter: Payer: Self-pay | Admitting: Obstetrics & Gynecology

## 2017-02-10 ENCOUNTER — Other Ambulatory Visit: Payer: Self-pay | Admitting: Obstetrics & Gynecology

## 2017-02-10 DIAGNOSIS — N912 Amenorrhea, unspecified: Secondary | ICD-10-CM

## 2017-02-23 MED FILL — RESTASIS MULTIDOSE 0.05% EY: 0.05 | 30 days supply | Qty: 6 | Fill #3

## 2017-03-08 ENCOUNTER — Other Ambulatory Visit: Payer: Self-pay | Admitting: Family Medicine

## 2017-03-08 DIAGNOSIS — Z1231 Encounter for screening mammogram for malignant neoplasm of breast: Secondary | ICD-10-CM

## 2017-03-13 DIAGNOSIS — H40051 Ocular hypertension, right eye: Secondary | ICD-10-CM | POA: Diagnosis not present

## 2017-03-13 DIAGNOSIS — H15101 Unspecified episcleritis, right eye: Secondary | ICD-10-CM | POA: Diagnosis not present

## 2017-03-28 MED FILL — RESTASIS MULTIDOSE 0.05% EY: 0.05 | 30 days supply | Qty: 6 | Fill #4

## 2017-04-13 ENCOUNTER — Encounter: Payer: Self-pay | Admitting: Radiology

## 2017-04-13 ENCOUNTER — Ambulatory Visit
Admission: RE | Admit: 2017-04-13 | Discharge: 2017-04-13 | Disposition: A | Discharge status: Home / Self Care | Payer: 59 | Source: Ambulatory Visit | Attending: Family Medicine | Admitting: Family Medicine

## 2017-04-13 DIAGNOSIS — Z1231 Encounter for screening mammogram for malignant neoplasm of breast: Secondary | ICD-10-CM | POA: Diagnosis not present

## 2017-04-28 MED FILL — RESTASIS MULTIDOSE 0.05% EY: 0.05 | 30 days supply | Qty: 6 | Fill #5

## 2017-04-28 MED FILL — PANTOPRAZOLE SOD DR 40 MG T: 40 | 30 days supply | Qty: 30 | Fill #1

## 2017-05-24 MED FILL — PANTOPRAZOLE SOD DR 40 MG T: 40 | 30 days supply | Qty: 30 | Fill #2

## 2017-06-19 DIAGNOSIS — H2513 Age-related nuclear cataract, bilateral: Secondary | ICD-10-CM | POA: Diagnosis not present

## 2017-06-19 MED FILL — RESTASIS MULTIDOSE 0.05% EY: 0.05 | 30 days supply | Qty: 6 | Fill #6

## 2017-06-22 ENCOUNTER — Encounter (INDEPENDENT_AMBULATORY_CARE_PROVIDER_SITE_OTHER): Payer: 59 | Admitting: Ophthalmology

## 2017-06-22 DIAGNOSIS — H5213 Myopia, bilateral: Secondary | ICD-10-CM | POA: Diagnosis not present

## 2017-06-22 DIAGNOSIS — H31002 Unspecified chorioretinal scars, left eye: Secondary | ICD-10-CM | POA: Diagnosis not present

## 2017-06-22 DIAGNOSIS — H2513 Age-related nuclear cataract, bilateral: Secondary | ICD-10-CM

## 2017-06-22 DIAGNOSIS — H338 Other retinal detachments: Secondary | ICD-10-CM

## 2017-06-22 DIAGNOSIS — H43813 Vitreous degeneration, bilateral: Secondary | ICD-10-CM | POA: Diagnosis not present

## 2017-07-03 DIAGNOSIS — R200 Anesthesia of skin: Secondary | ICD-10-CM | POA: Diagnosis not present

## 2017-07-03 DIAGNOSIS — Z0000 Encounter for general adult medical examination without abnormal findings: Secondary | ICD-10-CM | POA: Diagnosis not present

## 2017-07-03 DIAGNOSIS — E7800 Pure hypercholesterolemia, unspecified: Secondary | ICD-10-CM | POA: Diagnosis not present

## 2017-07-03 DIAGNOSIS — K219 Gastro-esophageal reflux disease without esophagitis: Secondary | ICD-10-CM | POA: Diagnosis not present

## 2017-07-03 MED FILL — PANTOPRAZOLE SOD DR 40 MG T: 40 | 30 days supply | Qty: 30 | Fill #3

## 2017-07-03 MED FILL — VALACYCLOVIR HCL 500 MG TAB: 500 | 30 days supply | Qty: 30 | Fill #0

## 2017-07-19 DIAGNOSIS — H2513 Age-related nuclear cataract, bilateral: Secondary | ICD-10-CM | POA: Diagnosis not present

## 2017-07-24 MED FILL — RESTASIS MULTIDOSE 0.05% EY: 0.05 | 30 days supply | Qty: 6 | Fill #7

## 2017-08-03 ENCOUNTER — Other Ambulatory Visit (HOSPITAL_COMMUNITY): Payer: Self-pay | Admitting: Gastroenterology

## 2017-08-03 DIAGNOSIS — K219 Gastro-esophageal reflux disease without esophagitis: Secondary | ICD-10-CM | POA: Diagnosis not present

## 2017-08-03 DIAGNOSIS — R1013 Epigastric pain: Secondary | ICD-10-CM | POA: Diagnosis not present

## 2017-08-07 ENCOUNTER — Ambulatory Visit (HOSPITAL_COMMUNITY)
Admission: RE | Admit: 2017-08-07 | Discharge: 2017-08-07 | Disposition: A | Discharge status: Home / Self Care | Payer: 59 | Source: Ambulatory Visit | Attending: Gastroenterology | Admitting: Gastroenterology

## 2017-08-07 DIAGNOSIS — R1013 Epigastric pain: Secondary | ICD-10-CM | POA: Insufficient documentation

## 2017-08-07 DIAGNOSIS — R932 Abnormal findings on diagnostic imaging of liver and biliary tract: Secondary | ICD-10-CM | POA: Diagnosis not present

## 2017-08-15 DIAGNOSIS — M2550 Pain in unspecified joint: Secondary | ICD-10-CM | POA: Diagnosis not present

## 2017-08-15 DIAGNOSIS — H04123 Dry eye syndrome of bilateral lacrimal glands: Secondary | ICD-10-CM | POA: Diagnosis not present

## 2017-08-15 DIAGNOSIS — M79643 Pain in unspecified hand: Secondary | ICD-10-CM | POA: Diagnosis not present

## 2017-08-15 DIAGNOSIS — R768 Other specified abnormal immunological findings in serum: Secondary | ICD-10-CM | POA: Diagnosis not present

## 2017-08-15 DIAGNOSIS — M542 Cervicalgia: Secondary | ICD-10-CM | POA: Diagnosis not present

## 2017-08-15 DIAGNOSIS — M5030 Other cervical disc degeneration, unspecified cervical region: Secondary | ICD-10-CM | POA: Diagnosis not present

## 2017-08-15 DIAGNOSIS — M797 Fibromyalgia: Secondary | ICD-10-CM | POA: Diagnosis not present

## 2017-08-21 MED FILL — RESTASIS 0.05% EYE EMULSION: 0.05 | 90 days supply | Qty: 180 | Fill #0

## 2017-08-25 MED FILL — PANTOPRAZOLE SOD DR 40 MG T: 40 | 30 days supply | Qty: 30 | Fill #0

## 2017-08-30 MED FILL — MOXIFLOXACIN 0.5% EYE DROPS: 0.5 | 30 days supply | Qty: 3 | Fill #0

## 2017-08-30 MED FILL — DUREZOL 0.05% EYE DROPS: 0.05 | 25 days supply | Qty: 5 | Fill #0

## 2017-09-15 ENCOUNTER — Encounter: Payer: Self-pay | Admitting: Obstetrics & Gynecology

## 2017-09-15 ENCOUNTER — Ambulatory Visit (INDEPENDENT_AMBULATORY_CARE_PROVIDER_SITE_OTHER): Payer: 59 | Admitting: Obstetrics & Gynecology

## 2017-09-15 VITALS — BP 114/70 | Ht 62.75 in | Wt 140.6 lb

## 2017-09-15 DIAGNOSIS — Z01419 Encounter for gynecological examination (general) (routine) without abnormal findings: Secondary | ICD-10-CM

## 2017-09-15 DIAGNOSIS — Z9189 Other specified personal risk factors, not elsewhere classified: Secondary | ICD-10-CM | POA: Diagnosis not present

## 2017-09-15 NOTE — Progress Notes (Signed)
Ashley Marshall 03/26/1971 [id number]   History:    47 y.o. G3P2A1L2 Married.  Vasectomy  RP:  Established patient presenting for annual gyn exam   HPI: S/P RSO 11/04/2016.  Menses every month with normal flow x 5 days since January 2019.  Had [place] from 11/2016 to 03/2017.  No pelvic pain.  Normal vaginal secretions.  No pain with intercourse.  Urine and bowel movements normal.  Breast normal.  Health labs with family physician.  Past medical history,surgical history, family history and social history were all reviewed and documented in the EPIC chart.  Gynecologic History Patient's last menstrual period was 08/31/2017. Contraception: vasectomy Last Pap: 09/2016. Results were: Negative, HPV HR neg Last mammogram: 03/2017. Results were: Negative Bone Density: Never Colonoscopy: Never  Obstetric History OB History  Gravida Para Term Preterm AB Living  3 2     1 2   SAB TAB Ectopic Multiple Live Births  1            # Outcome Date GA Lbr Len/2nd Weight Sex Delivery Anes PTL Lv  3 SAB           2 Para           1 Para              ROS: A ROS was performed and pertinent positives and negatives are included in the history.  GENERAL: No fevers or chills. HEENT: No change in vision, no earache, sore throat or sinus congestion. NECK: No pain or stiffness. CARDIOVASCULAR: No chest pain or pressure. No palpitations. PULMONARY: No shortness of breath, cough or wheeze. GASTROINTESTINAL: No abdominal pain, nausea, vomiting or diarrhea, melena or bright red blood per rectum. GENITOURINARY: No urinary frequency, urgency, hesitancy or dysuria. MUSCULOSKELETAL: No joint or muscle pain, no back pain, no recent trauma. DERMATOLOGIC: No rash, no itching, no lesions. ENDOCRINE: No polyuria, polydipsia, no heat or cold intolerance. No recent change in weight. HEMATOLOGICAL: No anemia or easy bruising or bleeding. NEUROLOGIC: No headache, seizures, numbness, tingling or weakness. PSYCHIATRIC: No  depression, no loss of interest in normal activity or change in sleep pattern.     Exam:   BP 114/70   Ht 5' 2.75" (1.594 m)   Wt 140 lb 9.6 oz (63.8 kg)   LMP 08/31/2017 Comment: vasectomy   BMI 25.11 kg/m   Body mass index is 25.11 kg/m.  General appearance : Well developed well nourished female. No acute distress HEENT: Eyes: no retinal hemorrhage or exudates,  Neck supple, trachea midline, no carotid bruits, no thyroidmegaly Lungs: Clear to auscultation, no rhonchi or wheezes, or rib retractions  Heart: Regular rate and rhythm, no murmurs or gallops Breast:Examined in sitting and supine position were symmetrical in appearance, no palpable masses or tenderness,  no skin retraction, no nipple inversion, no nipple discharge, no skin discoloration, no axillary or supraclavicular lymphadenopathy Abdomen: no palpable masses or tenderness, no rebound or guarding Extremities: no edema or skin discoloration or tenderness  Pelvic: Vulva: Normal             Vagina: No gross lesions or discharge  Cervix: No gross lesions or discharge.  Pap reflex done.  Uterus  AV, normal size, shape and consistency, non-tender and mobile  Adnexa  Without masses or tenderness  Anus: Normal   Assessment/Plan:  47 y.o. female for annual exam   1. Encounter for routine gynecological examination with Papanicolaou smear of cervix Normal gynecologic exam status post right salpingo-oophorectomy.  Last  Pap May 2018 was negative with negative high risk HPV.  Breast exam normal.  Last mammogram negative in November 2018.  Health labs with family physician. - Pap IG w/ reflex to HPV when ASC-U  2. Relies on partner vasectomy for contraception   [name] MD, 10:07 AM 09/15/2017

## 2017-09-16 ENCOUNTER — Encounter: Payer: Self-pay | Admitting: Obstetrics & Gynecology

## 2017-09-16 NOTE — Patient Instructions (Signed)
1. Encounter for routine gynecological examination with Papanicolaou smear of cervix Normal gynecologic exam status post right salpingo-oophorectomy.  Last Pap May 2018 was negative with negative high risk HPV.  Breast exam normal.  Last mammogram negative in November 2018.  Health labs with family physician. - Pap IG w/ reflex to HPV when ASC-U  2. Relies on partner vasectomy for contraception  Ashley Marshall, it was a pleasure seeing you today!  I will inform you of your results as soon as they are available.

## 2017-09-19 ENCOUNTER — Encounter: Payer: Self-pay | Admitting: *Deleted

## 2017-09-19 DIAGNOSIS — H2511 Age-related nuclear cataract, right eye: Secondary | ICD-10-CM | POA: Diagnosis not present

## 2017-09-19 DIAGNOSIS — H25811 Combined forms of age-related cataract, right eye: Secondary | ICD-10-CM | POA: Diagnosis not present

## 2017-09-19 LAB — PAP IG W/ RFLX HPV ASCU

## 2017-09-21 MED FILL — DUREZOL 0.05% EYE DROPS: 0.05 | 25 days supply | Qty: 5 | Fill #0

## 2017-09-21 MED FILL — GATIFLOXACIN 0.5% EYE DROPS: 0.5 | 23 days supply | Qty: 3 | Fill #0

## 2017-09-22 MED FILL — PANTOPRAZOLE SOD DR 40 MG T: 40 | 30 days supply | Qty: 30 | Fill #1

## 2017-09-26 DIAGNOSIS — H52202 Unspecified astigmatism, left eye: Secondary | ICD-10-CM | POA: Diagnosis not present

## 2017-09-26 DIAGNOSIS — H2512 Age-related nuclear cataract, left eye: Secondary | ICD-10-CM | POA: Diagnosis not present

## 2017-09-26 DIAGNOSIS — H25812 Combined forms of age-related cataract, left eye: Secondary | ICD-10-CM | POA: Diagnosis not present

## 2017-10-20 DIAGNOSIS — M79605 Pain in left leg: Secondary | ICD-10-CM | POA: Diagnosis not present

## 2017-10-25 MED FILL — PANTOPRAZOLE SOD DR 40 MG T: 40 | 30 days supply | Qty: 30 | Fill #2

## 2017-10-26 DIAGNOSIS — M79605 Pain in left leg: Secondary | ICD-10-CM | POA: Diagnosis not present

## 2017-11-08 DIAGNOSIS — I8312 Varicose veins of left lower extremity with inflammation: Secondary | ICD-10-CM | POA: Diagnosis not present

## 2017-11-29 MED FILL — PANTOPRAZOLE SOD DR 40 MG T: 40 | 30 days supply | Qty: 30 | Fill #3

## 2017-11-29 MED FILL — VALACYCLOVIR HCL 500 MG TAB: 500 | 30 days supply | Qty: 30 | Fill #1

## 2017-11-30 MED FILL — RESTASIS 0.05% EYE EMULSION: 0.05 | 90 days supply | Qty: 180 | Fill #0

## 2017-12-11 MED FILL — traZODone HCL 50 MG TABS: 50 | 10 days supply | Qty: 30 | Fill #0

## 2018-01-01 MED FILL — PANTOPRAZOLE SOD DR 40 MG T: 40 | 30 days supply | Qty: 30 | Fill #4

## 2018-01-25 ENCOUNTER — Other Ambulatory Visit: Payer: Self-pay | Admitting: Gastroenterology

## 2018-01-25 DIAGNOSIS — R935 Abnormal findings on diagnostic imaging of other abdominal regions, including retroperitoneum: Secondary | ICD-10-CM

## 2018-01-29 DIAGNOSIS — R232 Flushing: Secondary | ICD-10-CM | POA: Diagnosis not present

## 2018-01-29 DIAGNOSIS — R8290 Unspecified abnormal findings in urine: Secondary | ICD-10-CM | POA: Diagnosis not present

## 2018-01-29 DIAGNOSIS — M7711 Lateral epicondylitis, right elbow: Secondary | ICD-10-CM | POA: Diagnosis not present

## 2018-01-29 DIAGNOSIS — G4700 Insomnia, unspecified: Secondary | ICD-10-CM | POA: Diagnosis not present

## 2018-01-29 DIAGNOSIS — R7309 Other abnormal glucose: Secondary | ICD-10-CM | POA: Diagnosis not present

## 2018-01-29 MED FILL — clonazePAM 0.5 MG TABS: 0.5 | 30 days supply | Qty: 30 | Fill #0

## 2018-02-02 ENCOUNTER — Ambulatory Visit
Admission: RE | Admit: 2018-02-02 | Discharge: 2018-02-02 | Disposition: A | Discharge status: Home / Self Care | Payer: 59 | Source: Ambulatory Visit | Attending: Gastroenterology | Admitting: Gastroenterology

## 2018-02-02 ENCOUNTER — Other Ambulatory Visit: Payer: 59

## 2018-02-02 DIAGNOSIS — R935 Abnormal findings on diagnostic imaging of other abdominal regions, including retroperitoneum: Secondary | ICD-10-CM

## 2018-02-02 DIAGNOSIS — K7689 Other specified diseases of liver: Secondary | ICD-10-CM | POA: Diagnosis not present

## 2018-02-02 MED FILL — AMOXICILLIN 875 MG TABS: 875 | 7 days supply | Qty: 14 | Fill #0

## 2018-02-06 MED FILL — PANTOPRAZOLE SOD DR 40 MG T: 40 | 30 days supply | Qty: 30 | Fill #5

## 2018-02-14 DIAGNOSIS — H04123 Dry eye syndrome of bilateral lacrimal glands: Secondary | ICD-10-CM | POA: Diagnosis not present

## 2018-02-14 DIAGNOSIS — M79643 Pain in unspecified hand: Secondary | ICD-10-CM | POA: Diagnosis not present

## 2018-02-14 DIAGNOSIS — M5030 Other cervical disc degeneration, unspecified cervical region: Secondary | ICD-10-CM | POA: Diagnosis not present

## 2018-02-14 DIAGNOSIS — R768 Other specified abnormal immunological findings in serum: Secondary | ICD-10-CM | POA: Diagnosis not present

## 2018-02-14 DIAGNOSIS — M542 Cervicalgia: Secondary | ICD-10-CM | POA: Diagnosis not present

## 2018-02-14 DIAGNOSIS — M797 Fibromyalgia: Secondary | ICD-10-CM | POA: Diagnosis not present

## 2018-02-14 DIAGNOSIS — M2550 Pain in unspecified joint: Secondary | ICD-10-CM | POA: Diagnosis not present

## 2018-02-28 ENCOUNTER — Other Ambulatory Visit: Payer: Self-pay | Admitting: Family Medicine

## 2018-02-28 DIAGNOSIS — Z1231 Encounter for screening mammogram for malignant neoplasm of breast: Secondary | ICD-10-CM

## 2018-03-05 DIAGNOSIS — N912 Amenorrhea, unspecified: Secondary | ICD-10-CM | POA: Diagnosis not present

## 2018-03-05 DIAGNOSIS — M7711 Lateral epicondylitis, right elbow: Secondary | ICD-10-CM | POA: Diagnosis not present

## 2018-03-05 DIAGNOSIS — Z13228 Encounter for screening for other metabolic disorders: Secondary | ICD-10-CM | POA: Diagnosis not present

## 2018-03-05 DIAGNOSIS — R350 Frequency of micturition: Secondary | ICD-10-CM | POA: Diagnosis not present

## 2018-03-08 MED FILL — RESTASIS 0.05% EYE EMULSION: 0.05 | 90 days supply | Qty: 180 | Fill #1

## 2018-03-09 ENCOUNTER — Encounter: Payer: Self-pay | Admitting: *Deleted

## 2018-03-09 ENCOUNTER — Encounter: Payer: Self-pay | Admitting: Anesthesiology

## 2018-03-16 MED FILL — PANTOPRAZOLE SOD DR 40 MG T: 40 | 30 days supply | Qty: 30 | Fill #0

## 2018-04-09 MED FILL — PANTOPRAZOLE SOD DR 40 MG T: 40 | 30 days supply | Qty: 30 | Fill #1

## 2018-04-16 ENCOUNTER — Ambulatory Visit
Admission: RE | Admit: 2018-04-16 | Discharge: 2018-04-16 | Disposition: A | Discharge status: Home / Self Care | Payer: 59 | Source: Ambulatory Visit | Attending: Family Medicine | Admitting: Family Medicine

## 2018-04-16 DIAGNOSIS — Z1231 Encounter for screening mammogram for malignant neoplasm of breast: Secondary | ICD-10-CM

## 2018-05-14 MED FILL — PANTOPRAZOLE SOD DR 40 MG T: 40 | 30 days supply | Qty: 30 | Fill #2

## 2018-06-11 MED FILL — PANTOPRAZOLE SOD DR 40 MG T: 40 | 30 days supply | Qty: 30 | Fill #0

## 2018-06-19 DIAGNOSIS — F411 Generalized anxiety disorder: Secondary | ICD-10-CM | POA: Diagnosis not present

## 2018-07-02 DIAGNOSIS — F411 Generalized anxiety disorder: Secondary | ICD-10-CM | POA: Diagnosis not present

## 2018-07-02 MED FILL — RESTASIS 0.05% EYE EMULSION: 0.05 | 90 days supply | Qty: 180 | Fill #2

## 2018-07-15 MED FILL — PANTOPRAZOLE SOD DR 40 MG T: 40 | 30 days supply | Qty: 30 | Fill #1

## 2018-07-17 ENCOUNTER — Encounter: Payer: Self-pay | Admitting: Physician Assistant

## 2018-07-17 ENCOUNTER — Ambulatory Visit (INDEPENDENT_AMBULATORY_CARE_PROVIDER_SITE_OTHER): Payer: Self-pay | Admitting: Physician Assistant

## 2018-07-17 VITALS — BP 115/75 | HR 94 | Temp 98.6°F | Resp 16 | Wt 144.0 lb

## 2018-07-17 DIAGNOSIS — H6983 Other specified disorders of Eustachian tube, bilateral: Secondary | ICD-10-CM

## 2018-07-17 DIAGNOSIS — J069 Acute upper respiratory infection, unspecified: Secondary | ICD-10-CM

## 2018-07-17 MED ORDER — GUAIFENESIN ER 1200 MG PO TB12: 1.0000 | ORAL_TABLET | Freq: Two times a day (BID) | ORAL | 0 refills | Status: DC | PRN

## 2018-07-17 MED ORDER — SALINE SPRAY 0.65 % NA SOLN
1.0000 | NASAL | 0 refills | Status: DC | PRN
Start: 2018-07-17 — End: 2018-09-26

## 2018-07-17 MED ORDER — FLUTICASONE PROPIONATE 50 MCG/ACT NA SUSP
2.0000 | Freq: Every day | NASAL | 0 refills | Status: DC
Start: 2018-07-17 — End: 2018-09-26

## 2018-07-17 MED ORDER — BENZONATATE 100 MG PO CAPS: 100.0000 mg | ORAL_CAPSULE | Freq: Three times a day (TID) | ORAL | 0 refills | Status: DC | PRN

## 2018-07-17 MED ORDER — CETIRIZINE HCL 10 MG PO TABS: 10.0000 mg | ORAL_TABLET | Freq: Every day | ORAL | 0 refills | Status: DC

## 2018-07-17 NOTE — Progress Notes (Signed)
MRN: [id number] DOB: 04/12/1971  Subjective:   Ashley Marshall is a 48 y.o. female presenting for chief complaint of 4 day history of illness. Started out with scratchy throat, dry cough,  and runny nose. Then developed sinus pressure. Denies fever, ear drainage, inability to swallow, voice change, productive cough, wheezing, shortness of breath, chest tightness, chest pain and myalgia, nausea, vomiting, abdominal pain and diarrhea. Has tried tylenol with no relief. Ashley Marshall had similar sx- was tested for both flu and strep at pediatrician office, both were negative-tx for viral illness. Marland Kitchen PMH of seasonal allergies- not taking anything daily. Does have a history of tachycardia and therefore does not like using OTC decongestants. No PMH of HTN, DM, or autoimmune disorder.  Patient has had flu shot this season. Denies smoking. Denies any other aggravating or relieving factors, no other questions or concerns.  Review of Systems  Constitutional: Negative for diaphoresis.  Eyes: Negative for blurred vision, double vision and photophobia.  Neurological: Negative for dizziness and headaches.    Ashley Marshall has a current medication list which includes the following prescription(s): acetaminophen, calcium-vitamin Ashley, cyclosporine, docusate sodium, pantoprazole, polyethylene glycol, magnesium (amino acid chelate), valacyclovir, and ibuprofen. Also is allergic to [employer] tartrate]; flagyl [metronidazole]; and septra [sulfamethoxazole-trimethoprim].  Ashley Marshall  has a past medical history of Constipation, GERD (gastroesophageal reflux disease), Herpes, History of kidney stones, HPV in female, Hyperlipidemia, Missed ab, Seasonal allergies, and SVD (spontaneous vaginal delivery). Also  has a past surgical history that includes Retinal detachment surgery (Left, 2000); Colposcopy; Wisdom tooth extraction; Colonoscopy; Robotic assisted laparoscopic ovarian cystectomy (Left, 11/04/2016); Dilatation & curettage/hysteroscopy  with myosure (N/A, 11/04/2016); Robotic assisted salpingo oophorectomy (Right, 11/04/2016); and Cataract extraction (Bilateral).   Objective:   Vitals: BP 115/75 (BP Location: Right Arm, Patient Position: Sitting, Cuff Size: Normal)   Pulse 94   Temp 98.6 F (37 C) (Oral)   Resp 16   Wt 144 lb (65.3 kg)   SpO2 96%   BMI 25.71 kg/m   Physical Exam Vitals signs reviewed.  Constitutional:      General: She is not in acute distress.    Appearance: She is well-developed. She is not ill-appearing or toxic-appearing.  HENT:     Head: Normocephalic and atraumatic.     Right Ear: Ear canal and external ear normal. A middle ear effusion is present. Tympanic membrane is not erythematous or bulging.     Left Ear: Ear canal and external ear normal. A middle ear effusion is present. Tympanic membrane is not erythematous or bulging.     Nose: Mucosal edema (moderate b/l), congestion and rhinorrhea present. Rhinorrhea is clear.     Right Sinus: No maxillary sinus tenderness or frontal sinus tenderness.     Left Sinus: No maxillary sinus tenderness or frontal sinus tenderness.     Mouth/Throat:     Lips: Pink.     Mouth: Mucous membranes are moist.     Pharynx: Uvula midline. Posterior oropharyngeal erythema present. No pharyngeal swelling or uvula swelling.     Tonsils: No tonsillar exudate or tonsillar abscesses.  Eyes:     Conjunctiva/sclera: Conjunctivae normal.  Neck:     Musculoskeletal: Full passive range of motion without pain and normal range of motion. No edema or neck rigidity.  Cardiovascular:     Rate and Rhythm: Normal rate and regular rhythm.     Heart sounds: Normal heart sounds.  Pulmonary:     Effort: Pulmonary effort is normal.     Breath  sounds: Normal breath sounds. No decreased breath sounds, wheezing, rhonchi or rales.  Lymphadenopathy:     Head:     Right side of head: No submental, submandibular, tonsillar, preauricular, posterior auricular or occipital adenopathy.      Left side of head: No submental, submandibular, tonsillar, preauricular, posterior auricular or occipital adenopathy.     Cervical: No cervical adenopathy.     Upper Body:     Right upper body: No supraclavicular adenopathy.     Left upper body: No supraclavicular adenopathy.  Skin:    General: Skin is warm and dry.  Neurological:     Mental Status: She is alert.     No results found for this or any previous visit (from the past 24 hour(s)).  Assessment and Plan :  1. Viral URI 2. Dysfunction of both eustachian tubes Patient is overall well-appearing, no acute distress.  VSS. History and physical exam are consistent with viral URI.  Given educational material on viral URI.  Recommend symptomatic treatment at this time.  Advised to contact office if sinus pressure persists/worsens over the next 5 days, would consider Rx for antibiotic at that time.  Advised to follow-up with family doctor or local urgent care if no improvement in symptoms after 7 to 10 days.  Seek care sooner at local urgent care or ED if symptoms worsen/develop new concerning symptoms.  Patient voices understanding.  Meds ordered this encounter  Medications  . benzonatate (TESSALON) 100 MG capsule    Sig: Take 1-2 capsules (100-200 mg total) by mouth 3 (three) times daily as needed for cough.    Dispense:  40 capsule    Refill:  0    Order Specific Question:   Supervising Provider    Answer:   MILLER, BRIAN [[id number]]  . Guaifenesin (MUCINEX MAXIMUM STRENGTH) 1200 MG TB12    Sig: Take 1 tablet (1,200 mg total) by mouth every 12 (twelve) hours as needed.    Dispense:  14 tablet    Refill:  0    Order Specific Question:   Supervising Provider    Answer:   MILLER, BRIAN [[id number]]  . fluticasone (FLONASE) 50 MCG/ACT nasal spray    Sig: Place 2 sprays into both nostrils daily.    Dispense:  16 g    Refill:  0    Order Specific Question:   Supervising Provider    Answer:   MILLER, BRIAN [[id number]]  . sodium chloride  (OCEAN) 0.65 % SOLN nasal spray    Sig: Place 1 spray into both nostrils as needed.    Dispense:  60 mL    Refill:  0    Order Specific Question:   Supervising Provider    Answer:   MILLER, BRIAN [[id number]]  . cetirizine (ZYRTEC) 10 MG tablet    Sig: Take 1 tablet (10 mg total) by mouth daily.    Dispense:  30 tablet    Refill:  0    Order Specific Question:   Supervising Provider    Answer:   Sabra Heck, BRIAN [[id number]]      Ashley Marshall, [facility] 07/17/2018 1:02 PM

## 2018-07-17 NOTE — Patient Instructions (Signed)
Sinusitis, Adult  This is likely viral sinusitis. I suggest rest, hydration, and eating light meals.  Use flonase, zyrtec, and mucinex in the morning, nasal saline at night time.  If you need help with stopping the cough, use tessalon perles. If no improvement in sinus pressure in 5 days, please contact our office.  If any of your other symptoms worsen or you develop new concerning symptoms, please seek care sooner.    Sinusitis is soreness and swelling (inflammation) of your sinuses. Sinuses are hollow spaces in the bones around your face. They are located:  Around your eyes.  In the middle of your forehead.  Behind your nose.  In your cheekbones. Your sinuses and nasal passages are lined with a fluid called mucus. Mucus drains out of your sinuses. Swelling can trap mucus in your sinuses. This lets germs (bacteria, virus, or fungus) grow, which leads to infection. Most of the time, this condition is caused by a virus. What are the causes? This condition is caused by:  Allergies.  Asthma.  Germs.  Things that block your nose or sinuses.  Growths in the nose (nasal polyps).  Chemicals or irritants in the air.  Fungus (rare). What increases the risk? You are more likely to develop this condition if:  You have a weak body defense system (immune system).  You do a lot of swimming or diving.  You use nasal sprays too much.  You smoke. What are the signs or symptoms? The main symptoms of this condition are pain and a feeling of pressure around the sinuses. Other symptoms include:  Stuffy nose (congestion).  Runny nose (drainage).  Swelling and warmth in the sinuses.  Headache.  Toothache.  A cough that may get worse at night.  Mucus that collects in the throat or the back of the nose (postnasal drip).  Being unable to smell and taste.  Being very tired (fatigue).  A fever.  Sore throat.  Bad breath. How is this diagnosed? This condition is diagnosed  based on:  Your symptoms.  Your medical history.  A physical exam.  Tests to find out if your condition is short-term (acute) or long-term (chronic). Your doctor may: ? Check your nose for growths (polyps). ? Check your sinuses using a tool that has a light (endoscope). ? Check for allergies or germs. ? Do imaging tests, such as an MRI or CT scan. How is this treated? Treatment for this condition depends on the cause and whether it is short-term or long-term.  If caused by a virus, your symptoms should go away on their own within 10 days. You may be given medicines to relieve symptoms. They include: ? Medicines that shrink swollen tissue in the nose. ? Medicines that treat allergies (antihistamines). ? A spray that treats swelling of the nostrils. ? Rinses that help get rid of thick mucus in your nose (nasal saline washes).  If caused by bacteria, your doctor may wait to see if you will get better without treatment. You may be given antibiotic medicine if you have: ? A very bad infection. ? A weak body defense system.  If caused by growths in the nose, you may need to have surgery. Follow these instructions at home: Medicines  Take, use, or apply over-the-counter and prescription medicines only as told by your doctor. These may include nasal sprays.  If you were prescribed an antibiotic medicine, take it as told by your doctor. Do not stop taking the antibiotic even if you start to  feel better. Hydrate and humidify   Drink enough water to keep your pee (urine) pale yellow.  Use a cool mist humidifier to keep the humidity level in your home above 50%.  Breathe in steam for 10-15 minutes, 3-4 times a day, or as told by your doctor. You can do this in the bathroom while a hot shower is running.  Try not to spend time in cool or dry air. Rest  Rest as much as you can.  Sleep with your head raised (elevated).  Make sure you get enough sleep each night. General  instructions   Put a warm, moist washcloth on your face 3-4 times a day, or as often as told by your doctor. This will help with discomfort.  Wash your hands often with soap and water. If there is no soap and water, use hand sanitizer.  Do not smoke. Avoid being around people who are smoking (secondhand smoke).  Keep all follow-up visits as told by your doctor. This is important. Contact a doctor if:  You have a fever.  Your symptoms get worse.  Your symptoms do not get better within 10 days. Get help right away if:  You have a very bad headache.  You cannot stop throwing up (vomiting).  You have very bad pain or swelling around your face or eyes.  You have trouble seeing.  You feel confused.  Your neck is stiff.  You have trouble breathing. Summary  Sinusitis is swelling of your sinuses. Sinuses are hollow spaces in the bones around your face.  This condition is caused by tissues in your nose that become inflamed or swollen. This traps germs. These can lead to infection.  If you were prescribed an antibiotic medicine, take it as told by your doctor. Do not stop taking it even if you start to feel better.  Keep all follow-up visits as told by your doctor. This is important. This information is not intended to replace advice given to you by your health care provider. Make sure you discuss any questions you have with your health care provider. Document Released: 10/19/2007 Document Revised: 10/02/2017 Document Reviewed: 10/02/2017 Elsevier Interactive Patient Education  2019 [employer].

## 2018-07-18 ENCOUNTER — Ambulatory Visit (INDEPENDENT_AMBULATORY_CARE_PROVIDER_SITE_OTHER): Payer: Self-pay | Admitting: Physician Assistant

## 2018-07-18 ENCOUNTER — Encounter: Payer: Self-pay | Admitting: Physician Assistant

## 2018-07-18 ENCOUNTER — Telehealth: Payer: Self-pay

## 2018-07-18 VITALS — BP 118/84 | HR 107 | Temp 98.4°F | Resp 12 | Wt 140.0 lb

## 2018-07-18 DIAGNOSIS — R509 Fever, unspecified: Secondary | ICD-10-CM

## 2018-07-18 DIAGNOSIS — R05 Cough: Secondary | ICD-10-CM

## 2018-07-18 DIAGNOSIS — J029 Acute pharyngitis, unspecified: Secondary | ICD-10-CM

## 2018-07-18 DIAGNOSIS — H109 Unspecified conjunctivitis: Secondary | ICD-10-CM

## 2018-07-18 DIAGNOSIS — R059 Cough, unspecified: Secondary | ICD-10-CM

## 2018-07-18 LAB — POCT RAPID STREP A (OFFICE): RAPID STREP A SCREEN: NEGATIVE

## 2018-07-18 LAB — POCT INFLUENZA A/B
INFLUENZA A, POC: NEGATIVE
INFLUENZA B, POC: NEGATIVE

## 2018-07-18 MED ORDER — AMOXICILLIN 500 MG PO CAPS: 500.0000 mg | ORAL_CAPSULE | Freq: Two times a day (BID) | ORAL | 0 refills | Status: AC

## 2018-07-18 MED ORDER — CIPROFLOXACIN HCL 0.3 % OP SOLN: 1.0000 [drp] | OPHTHALMIC | 0 refills | Status: DC

## 2018-07-18 NOTE — Patient Instructions (Signed)
Bacterial Conjunctivitis, Adult Bacterial conjunctivitis is an infection of your conjunctiva. This is the clear membrane that covers the white part of your eye and the inner part of your eyelid. This infection can make your eye:  Red or pink.  Itchy. This condition spreads easily from person to person (is contagious) and from one eye to the other eye. What are the causes?  This condition is caused by germs (bacteria). You may get the infection if you come into close contact with: ? A person who has the infection. ? Items that have germs on them (are contaminated), such as face towels, contact lens solution, or eye makeup. What increases the risk? You are more likely to get this condition if you:  Have contact with people who have the infection.  Wear contact lenses.  Have a sinus infection.  Have had a recent eye injury or surgery.  Have a weak body defense system (immune system).  Have dry eyes. What are the signs or symptoms?   Thick, yellowish discharge from the eye.  Tearing or watery eyes.  Itchy eyes.  Burning feeling in your eyes.  Eye redness.  Swollen eyelids.  Blurred vision. How is this treated?   Antibiotic eye drops or ointment.  Antibiotic medicine taken by mouth. This is used for infections that do not get better with drops or ointment or that last more than 10 days.  Cool, wet cloths placed on the eyes.  Artificial tears used 2-6 times a day. Follow these instructions at home: Medicines  Take or apply your antibiotic medicine as told by your doctor. Do not stop taking or applying the antibiotic even if you start to feel better.  Take or apply over-the-counter and prescription medicines only as told by your doctor.  Do not touch your eyelid with the eye-drop bottle or the ointment tube. Managing discomfort  Wipe any fluid from your eye with a warm, wet washcloth or a cotton ball.  Place a clean, cool, wet cloth on your eye. Do this for  10-20 minutes, 3-4 times per day. General instructions  Do not wear contacts until the infection is gone. Wear glasses until your doctor says it is okay to wear contacts again.  Do not wear eye makeup until the infection is gone. Throw away old eye makeup.  Change or wash your pillowcase every day.  Do not share towels or washcloths.  Wash your hands often with soap and water. Use paper towels to dry your hands.  Do not touch or rub your eyes.  Do not drive or use heavy machinery if your vision is blurred. Contact a doctor if:  You have a fever.  You do not get better after 10 days. Get help right away if:  You have a fever and your symptoms get worse all of a sudden.  You have very bad pain when you move your eye.  Your face: ? Hurts. ? Is red. ? Is swollen.  You have sudden loss of vision. Summary  Bacterial conjunctivitis is an infection of your conjunctiva.  This infection spreads easily from person to person.  Wash your hands often with soap and water. Use paper towels to dry your hands.  Take or apply your antibiotic medicine as told by your doctor.  Contact a doctor if you have a fever or you do not get better after 10 days. This information is not intended to replace advice given to you by your health care provider. Make sure you discuss any   questions you have with your health care provider.     Pharyngitis  Pharyngitis is redness, pain, and swelling (inflammation) of the throat (pharynx). It is a very common cause of sore throat. Pharyngitis can be caused by a bacteria, but it is usually caused by a virus. Most cases of pharyngitis get better on their own without treatment. What are the causes? This condition may be caused by:  Infection by viruses (viral). Viral pharyngitis spreads from person to person (is contagious) through coughing, sneezing, and sharing of personal items or utensils such as cups, forks, spoons, and toothbrushes.  Infection by  bacteria (bacterial). Bacterial pharyngitis may be spread by touching the nose or face after coming in contact with the bacteria, or through more intimate contact, such as kissing.  Allergies. Allergies can cause buildup of mucus in the throat (post-nasal drip), leading to inflammation and irritation. Allergies can also cause blocked nasal passages, forcing breathing through the mouth, which dries and irritates the throat. What increases the risk? You are more likely to develop this condition if:  You are 58-3 years old.  You are exposed to crowded environments such as daycare, school, or dormitory living.  You live in a cold climate.  You have a weakened disease-fighting (immune) system. What are the signs or symptoms? Symptoms of this condition vary by the cause (viral, bacterial, or allergies) and can include:  Sore throat.  Fatigue.  Low-grade fever.  Headache.  Joint pain and muscle aches.  Skin rashes.  Swollen glands in the throat (lymph nodes).  Plaque-like film on the throat or tonsils. This is often a symptom of bacterial pharyngitis.  Vomiting.  Stuffy nose (nasal congestion).  Cough.  Red, itchy eyes (conjunctivitis).  Loss of appetite. How is this diagnosed? This condition is often diagnosed based on your medical history and a physical exam. Your health care provider will ask you questions about your illness and your symptoms. A swab of your throat may be done to check for bacteria (rapid strep test). Other lab tests may also be done, depending on the suspected cause, but these are rare. How is this treated? This condition usually gets better in 3-4 days without medicine. Bacterial pharyngitis may be treated with antibiotic medicines. Follow these instructions at home:  Take over-the-counter and prescription medicines only as told by your health care provider. ? If you were prescribed an antibiotic medicine, take it as told by your health care provider. Do  not stop taking the antibiotic even if you start to feel better. ? Do not give children aspirin because of the association with Reye syndrome.  Drink enough water and fluids to keep your urine clear or pale yellow.  Get a lot of rest.  Gargle with a salt-water mixture 3-4 times a day or as needed. To make a salt-water mixture, completely dissolve -1 tsp of salt in 1 cup of warm water.  If your health care provider approves, you may use throat lozenges or sprays to soothe your throat. Contact a health care provider if:  You have large, tender lumps in your neck.  You have a rash.  You cough up green, yellow-brown, or bloody spit. Get help right away if:  Your neck becomes stiff.  You drool or are unable to swallow liquids.  You cannot drink or take medicines without vomiting.  You have severe pain that does not go away, even after you take medicine.  You have trouble breathing, and it is not caused by a stuffy  nose.  You have new pain and swelling in your joints such as the knees, ankles, wrists, or elbows. Summary  Pharyngitis is redness, pain, and swelling (inflammation) of the throat (pharynx).  While pharyngitis can be caused by a bacteria, the most common causes are viral.  Most cases of pharyngitis get better on their own without treatment.  Bacterial pharyngitis is treated with antibiotic medicines. This information is not intended to replace advice given to you by your health care provider. Make sure you discuss any questions you have with your health care provider. Document Released: 05/02/2005 Document Revised: 06/07/2016 Document Reviewed: 06/07/2016 Elsevier Interactive Patient Education  2019 [place] Released: 02/09/2008 Document Revised: 12/06/2017 Document Reviewed: 12/06/2017 Elsevier Interactive Patient Education  2019 [employer].

## 2018-07-18 NOTE — Telephone Encounter (Signed)
I returned patients phone call after I spoke with the provider and I told the patient, the redness in her eyes and clear drainage are part of the virus and she can use over the counter Opcon-A eye drops and if the drainage get thicker and white, she can return to the office to be evaluate. Patient understood and agreed with this plan.

## 2018-07-18 NOTE — Progress Notes (Signed)
Acute Office Visit  Subjective:    Patient ID: Ashley Marshall, female    DOB: Feb 06, 1971, 48 y.o.   MRN: [id number]  Chief Complaint  Patient presents with  . right eye irritation    HPI  Pt evaluat Patient is in today for R eye redness and yellow purulent discharge since this morning. States was evaluated at [facility] yesterday and was diagnosed with Viral URI, did not have any eye sxs until tody- when she woke up and R eye had yellow crusting, and was matted shut. Also states has cough, sore throat and fever for the past 5 days. Cough is productive on occasions, and is mild, feels like "tickle". Sore throat is worsening over past 2 days and is causing pain with swallowing, also has tender lymph nodes on the neck, bilaterally. Fever is low grade, temp at home was around 100. Denies any ha, body ache, visual changes, eye pain, injury to the eye, cp, dyspnea, abd pain, n, v.  States has dry eye syndrome and uses Restasis daily. Also states had bilat cataracts abt one year ago, with no deficits or issues.   Past Medical History:  Diagnosis Date  . Constipation   . GERD (gastroesophageal reflux disease)    no meds - diet controlled  . Herpes   . History of kidney stones    passed stones - no surgery required  . HPV in female   . Hyperlipidemia    diet controlled - no meds  . Missed ab    no surgery required  . Seasonal allergies   . SVD (spontaneous vaginal delivery)    x 2    Past Surgical History:  Procedure Laterality Date  . CATARACT EXTRACTION Bilateral   . COLONOSCOPY     x 2 -hx polyps  . COLPOSCOPY    . DILATATION & CURETTAGE/HYSTEROSCOPY WITH MYOSURE N/A 11/04/2016   Procedure: DILATATION & CURETTAGE/HYSTEROSCOPY WITH MYOSURE;  Surgeon: [name], MD;  Location: [facility];  Service: Gynecology;  Laterality: N/A;  . RETINAL DETACHMENT SURGERY Left 2000  . ROBOTIC ASSISTED LAPAROSCOPIC OVARIAN CYSTECTOMY Left 11/04/2016   Procedure: ROBOTIC ASSISTED LAPAROSCOPIC  OVARIAN CYSTECTOMY;  Surgeon: [name], MD;  Location: [facility];  Service: Gynecology;  Laterality: Left;  RNFA confirmed on 10/26/16 with chass  . ROBOTIC ASSISTED SALPINGO OOPHERECTOMY Right 11/04/2016   Procedure: ROBOTIC ASSISTED SALPINGO OOPHORECTOMY;  Surgeon: [name], MD;  Location: [facility];  Service: Gynecology;  Laterality: Right;  . WISDOM TOOTH EXTRACTION      Family History  Problem Relation Age of Onset  . Diabetes Father   . Breast cancer Paternal Aunt   . Cancer Paternal Uncle        pancreatic  . Diabetes Paternal Grandmother   . Cancer Paternal Grandmother        stomach   . Cancer Paternal Grandfather        colon    Social History   Socioeconomic History  . Marital status: Married    Spouse name: Not on file  . Number of children: Not on file  . Years of education: Not on file  . Highest education level: Not on file  Occupational History  . Not on file  Social Needs  . Financial resource strain: Not on file  . Food insecurity:    Worry: Not on file    Inability: Not on file  . Transportation needs:    Medical: Not on file    Non-medical: Not on file  Tobacco Use  . Smoking status: Never Smoker  . Smokeless tobacco: Never Used  Substance and Sexual Activity  . Alcohol use: Yes    Alcohol/week: 2.0 standard drinks    Types: 2 Glasses of wine per week    Comment: 2 glasses of wine a week   . Drug use: No  . Sexual activity: Yes    Partners: Male    Comment: 1st intercourse- 89, partners- 2, married- 15 yrs   Lifestyle  . Physical activity:    Days per week: Not on file    Minutes per session: Not on file  . Stress: Not on file  Relationships  . Social connections:    Talks on phone: Not on file    Gets together: Not on file    Attends religious service: Not on file    Active member of club or organization: Not on file    Attends meetings of clubs or organizations: Not on file    Relationship status: Not on file  . Intimate  partner violence:    Fear of current or ex partner: Not on file    Emotionally abused: Not on file    Physically abused: Not on file    Forced sexual activity: Not on file  Other Topics Concern  . Not on file  Social History Narrative  . Not on file    Outpatient Medications Prior to Visit  Medication Sig Dispense Refill  . calcium-vitamin D 250-100 MG-UNIT tablet Take 1 tablet by mouth 2 (two) times daily.    . cetirizine (ZYRTEC) 10 MG tablet Take 1 tablet (10 mg total) by mouth daily. 30 tablet 0  . cycloSPORINE (RESTASIS) 0.05 % ophthalmic emulsion 1 drop 2 (two) times daily.    . fluticasone (FLONASE) 50 MCG/ACT nasal spray Place 2 sprays into both nostrils daily. 16 g 0  . Guaifenesin (MUCINEX MAXIMUM STRENGTH) 1200 MG TB12 Take 1 tablet (1,200 mg total) by mouth every 12 (twelve) hours as needed. 14 tablet 0  . pantoprazole (PROTONIX) 40 MG tablet Take 40 mg by mouth daily.    Marland Kitchen Specialty Vitamins Products (MAGNESIUM, AMINO ACID CHELATE,) 133 MG tablet Take 1 tablet by mouth 2 (two) times daily.    . valACYclovir (VALTREX) 500 MG tablet Take 500 mg by mouth daily as needed.     Marland Kitchen acetaminophen (TYLENOL) 500 MG tablet Take 500 mg by mouth every 6 (six) hours as needed.    . benzonatate (TESSALON) 100 MG capsule Take 1-2 capsules (100-200 mg total) by mouth 3 (three) times daily as needed for cough. (Patient not taking: Reported on 07/18/2018) 40 capsule 0  . docusate sodium (COLACE) 100 MG capsule Take by mouth.    Marland Kitchen ibuprofen (ADVIL,MOTRIN) 800 MG tablet Take 800 mg by mouth every 8 (eight) hours as needed.    . polyethylene glycol (MIRALAX / GLYCOLAX) packet Take 17 g by mouth daily.    . sodium chloride (OCEAN) 0.65 % SOLN nasal spray Place 1 spray into both nostrils as needed. 60 mL 0   No facility-administered medications prior to visit.     Allergies  Allergen Reactions  . Ambien [Zolpidem Tartrate] Other (See Comments)    hallucinations  . Flagyl [Metronidazole]      neuropathy  . Septra [Sulfamethoxazole-Trimethoprim] Other (See Comments)    thrush    Review of Systems  Constitutional: Positive for fever. Negative for chills and malaise/fatigue.  HENT: Positive for sore throat.   Eyes: Positive for discharge  and redness. Negative for blurred vision, double vision and pain.  Respiratory: Negative for shortness of breath.   Cardiovascular: Negative for chest pain.  Gastrointestinal: Negative for abdominal pain, nausea and vomiting.  Musculoskeletal: Negative for myalgias.  Neurological: Negative for dizziness and headaches.       Objective:    Physical Exam  Constitutional: She is oriented to person, place, and time. She appears well-developed and well-nourished.  HENT:  Head: Normocephalic and atraumatic.  Eyes: Pupils are equal, round, and reactive to light. Conjunctivae are normal. Right eye exhibits discharge. Left eye exhibits no discharge. Scleral icterus (redness of the R conjunctivae) is present.  R eye with redness, swelling, and yellow/green mucopurulent discharge  Neck: Normal range of motion. Neck supple.  Cardiovascular: Regular rhythm.  tachycardic  Pulmonary/Chest: Breath sounds normal. She has no wheezes. She has no rales.  Abdominal: There is no abdominal tenderness.  Neurological: She is alert and oriented to person, place, and time.  Skin: Skin is warm and dry.    BP 118/84   Pulse (!) 107   Temp 98.4 F (36.9 C)   Resp 12   Wt 140 lb (63.5 kg)   SpO2 99%   BMI 25.00 kg/m  Wt Readings from Last 3 Encounters:  07/18/18 140 lb (63.5 kg)  07/17/18 144 lb (65.3 kg)  09/15/17 140 lb 9.6 oz (63.8 kg)    Health Maintenance Due  Topic Date Due  . HIV Screening  03/21/1985  . TETANUS/TDAP  03/21/1989    There are no preventive care reminders to display for this patient.   No results found for: TSH Lab Results  Component Value Date   WBC 12.1 (H) 11/04/2016   HGB 12.4 11/04/2016   HCT 35.5 (L) 11/04/2016    MCV 94.9 11/04/2016   PLT 203 11/04/2016   No results found for: NA, K, CHLORIDE, CO2, GLUCOSE, BUN, CREATININE, BILITOT, ALKPHOS, AST, ALT, PROT, ALBUMIN, CALCIUM, ANIONGAP, EGFR, GFR No results found for: CHOL No results found for: HDL No results found for: LDLCALC No results found for: TRIG No results found for: CHOLHDL No results found for: HGBA1C     Assessment & Plan:   Problem List Items Addressed This Visit    None    Visit Diagnoses    Sore throat    -  Primary   Relevant Orders   POCT rapid strep A   Fever, unspecified fever cause       Relevant Orders   POCT Influenza A/B     Plan  Bacterial conjunctivitis -unilateral, discolored discharge. -No evidence of injury or surgery, no visual changes  Acute pharyngitis -Rapid strep negative.- however due to chronicity of sxs and worsening, will treat.   Cough -states cough is mild- lung exam is normal with no acute findings -Advised PNA less likely, however, unable to exclude PNA (Chest Xray is needed) -Advised to follow up with PCP for Chest Xray -Any worsening, or any new sxs, go to the ER  Fever -Likely due to Strep, see above. Since worsening.  -Less likely PNA- lung exam normal, cough is improving, and is only minimal  No orders of the defined types were placed in this encounter.    Waldon Merl, PA-C

## 2018-07-19 ENCOUNTER — Telehealth: Payer: Self-pay

## 2018-07-20 ENCOUNTER — Telehealth: Payer: Self-pay

## 2018-07-20 NOTE — Telephone Encounter (Signed)
Called to follow up with pt and see how she is doing since her visit with [place], however, the calls kept getting disconnected.

## 2018-08-09 ENCOUNTER — Telehealth: Payer: 59 | Admitting: Nurse Practitioner

## 2018-08-09 DIAGNOSIS — J029 Acute pharyngitis, unspecified: Secondary | ICD-10-CM | POA: Diagnosis not present

## 2018-08-09 MED ORDER — LIDOCAINE VISCOUS HCL 2 % MT SOLN: 5.0000 mL | Freq: Four times a day (QID) | OROMUCOSAL | 0 refills | Status: AC | PRN

## 2018-08-09 MED ORDER — LIDOCAINE VISCOUS HCL 2 % MT SOLN: 5.0000 mL | Freq: Four times a day (QID) | OROMUCOSAL | 0 refills | Status: DC | PRN

## 2018-08-09 NOTE — Progress Notes (Signed)
We are sorry you are not feeling well.  Here is how we plan to help!  Based on what you have shared with me, it looks like you may have a viral upper respiratory infection.  Upper respiratory infections are caused by a large number of viruses; however, rhinovirus is the most common cause.   Symptoms vary from person to person, with common symptoms including sore throat, cough, and fatigue or lack of energy.  A low-grade fever of up to 100.4 may present, but is often uncommon.  Symptoms vary however, and are closely related to a person's age or underlying illnesses.  The most common symptoms associated with an upper respiratory infection are nasal discharge or congestion, cough, sneezing, headache and pressure in the ears and face.  These symptoms usually persist for about 3 to 10 days, but can last up to 2 weeks.  It is important to know that upper respiratory infections do not cause serious illness or complications in most cases.    Upper respiratory infections can be transmitted from person to person, with the most common method of transmission being a person's hands.  The virus is able to live on the skin and can infect other persons for up to 2 hours after direct contact.  Also, these can be transmitted when someone coughs or sneezes; thus, it is important to cover the mouth to reduce this risk.  To keep the spread of the illness at [place], good hand hygiene is very important.  This is an infection that is most likely caused by a virus. There are no specific treatments other than to help you with the symptoms until the infection runs its course.  We are sorry you are not feeling well.  Here is how we plan to help!   If your sore throat does not improve, you will need to follow up in an office will be able to provide a throat culture.   If you do not have a history of heart disease, hypertension, diabetes or thyroid disease, prostate/bladder issues or glaucoma, you may also use Sudafed to treat nasal  congestion.  It is highly recommended that you consult with a pharmacist or your primary care physician to ensure this medication is safe for you to take.     For your sore or scratchy throat, use a saltwater gargle-  to  teaspoon of salt dissolved in a 4-ounce to 8-ounce glass of warm water.  Gargle the solution for approximately 15-30 seconds and then spit.  It is important not to swallow the solution.  You can also use throat lozenges/cough drops and Chloraseptic spray to help with throat pain or discomfort.  Warm or cold liquids can also be helpful in relieving throat pain. If needed you may also take Ibuprofen 600-800mg  every 8 hours as needed for throat pain or discomfort.  I will also prescribe a Lidocaine mouthwash that you can gargle and swallow to help with your throat discomfort.   For headache, pain or general discomfort, you can use Ibuprofen or Tylenol as directed.   Some authorities believe that zinc sprays or the use of Echinacea may shorten the course of your symptoms.   HOME CARE . Only take medications as instructed by your medical team. . Be sure to drink plenty of fluids. Water is fine as well as fruit juices, sodas and electrolyte beverages. You may want to stay away from caffeine or alcohol. If you are nauseated, try taking small sips of liquids. How do you know  if you are getting enough fluid? Your urine should be a pale yellow or almost colorless. . Get rest. . Taking a steamy shower or using a humidifier may help nasal congestion and ease sore throat pain. You can place a towel over your head and breathe in the steam from hot water coming from a faucet. . Using a saline nasal spray works much the same way. . Cough drops, hard candies and sore throat lozenges may ease your cough. . Avoid close contacts especially the very young and the elderly . Cover your mouth if you cough or sneeze . Always remember to wash your hands.   GET HELP RIGHT AWAY IF: . You develop  worsening fever. . If your symptoms do not improve within 10 days . You develop yellow or green discharge from your nose over 3 days. . You have coughing fits . You develop a severe head ache or visual changes. . You develop shortness of breath, difficulty breathing or start having chest pain . Your symptoms persist after you have completed your treatment plan  MAKE SURE YOU   Understand these instructions.  Will watch your condition.  Will get help right away if you are not doing well or get worse.  Your e-visit answers were reviewed by a board certified advanced clinical practitioner to complete your personal care plan. Depending upon the condition, your plan could have included both over the counter or prescription medications. Please review your pharmacy choice. If there is a problem, you may call our nursing hot line at and have the prescription routed to another pharmacy. Your safety is important to [place]. If you have drug allergies check your prescription carefully.   You can use MyChart to ask questions about today's visit, request a non-urgent call back, or ask for a work or school excuse for 24 hours related to this e-Visit. If it has been greater than 24 hours you will need to follow up with your provider, or enter a new e-Visit to address those concerns. You will get an e-mail in the next two days asking about your experience.  I hope that your e-visit has been valuable and will speed your recovery. Thank you for using e-visits.

## 2018-08-10 NOTE — Progress Notes (Signed)
Patient sent a message requesting a return phone call.  Called the patient to follow-up.  Patient informs that she is concerned about her symptoms and returning to work.  The patient informed that she does not exhibit fever, chills, exudates, nausea, vomiting or headache.  Patient has been taking Tylenol for her throat pain, which seems to help.  Discussed with patient that based on her responses to the questionnaire, her symptoms appeared to viral at this time.  Also informed patient that she just finished a course of abx less than one month ago according to her chart.  Patient was provided symptomatic treatment for her symptoms.  Informed patient that if her symptoms do not improve within the next 24-48 hours, she could get a strep test.  Patient states that she will follow up at [facility] if necessary.

## 2018-08-10 NOTE — Progress Notes (Signed)
We are sorry you are not feeling well.  Here is how we plan to help!  Based on what you have shared with me, it looks like you may have a viral upper respiratory infection.  Upper respiratory infections are caused by a large number of viruses; however, rhinovirus is the most common cause.   Symptoms vary from person to person, with common symptoms including sore throat, cough, and fatigue or lack of energy.  A low-grade fever of up to 100.4 may present, but is often uncommon.  Symptoms vary however, and are closely related to a person's age or underlying illnesses.  The most common symptoms associated with an upper respiratory infection are nasal discharge or congestion, cough, sneezing, headache and pressure in the ears and face.  These symptoms usually persist for about 3 to 10 days, but can last up to 2 weeks.  It is important to know that upper respiratory infections do not cause serious illness or complications in most cases.    Upper respiratory infections can be transmitted from person to person, with the most common method of transmission being a person's hands.  The virus is able to live on the skin and can infect other persons for up to 2 hours after direct contact.  Also, these can be transmitted when someone coughs or sneezes; thus, it is important to cover the mouth to reduce this risk.  To keep the spread of the illness at [place], good hand hygiene is very important.  This is an infection that is most likely caused by a virus. There are no specific treatments other than to help you with the symptoms until the infection runs its course.  We are sorry you are not feeling well.  Here is how we plan to help!  If your sore throat does not improve, you may need to follow-up in an office that will be able to provide a throat culture.  If you do not have a history of heart disease, hypertension, diabetes or thyroid disease, prostate/bladder issues or glaucoma, you may also use Sudafed to treat nasal  congestion.  It is highly recommended that you consult with a pharmacist or your primary care physician to ensure this medication is safe for you to take.     If you have a sore or scratchy throat, use a saltwater gargle-  to  teaspoon of salt dissolved in a 4-ounce to 8-ounce glass of warm water.  Gargle the solution for approximately 15-30 seconds and then spit.  It is important not to swallow the solution.  You can also use throat lozenges/cough drops and Chloraseptic spray to help with throat pain or discomfort.  Warm or cold liquids can also be helpful in relieving throat pain. I will also prescribe a Lidocaine mouthwash that you can gargle and swallow to help with your throat discomfort.  For headache, pain or general discomfort, you can use Ibuprofen or Tylenol as directed.   Some authorities believe that zinc sprays or the use of Echinacea may shorten the course of your symptoms.   HOME CARE . Only take medications as instructed by your medical team. . Be sure to drink plenty of fluids. Water is fine as well as fruit juices, sodas and electrolyte beverages. You may want to stay away from caffeine or alcohol. If you are nauseated, try taking small sips of liquids. How do you know if you are getting enough fluid? Your urine should be a pale yellow or almost colorless. . Get rest. .  Taking a steamy shower or using a humidifier may help nasal congestion and ease sore throat pain. You can place a towel over your head and breathe in the steam from hot water coming from a faucet. . Using a saline nasal spray works much the same way. . Cough drops, hard candies and sore throat lozenges may ease your cough. . Avoid close contacts especially the very young and the elderly . Cover your mouth if you cough or sneeze . Always remember to wash your hands.   GET HELP RIGHT AWAY IF: . You develop worsening fever. . If your symptoms do not improve within 10 days . You develop yellow or green discharge  from your nose over 3 days. . You have coughing fits . You develop a severe head ache or visual changes. . You develop shortness of breath, difficulty breathing or start having chest pain . Your symptoms persist after you have completed your treatment plan  MAKE SURE YOU   Understand these instructions.  Will watch your condition.  Will get help right away if you are not doing well or get worse.  Your e-visit answers were reviewed by a board certified advanced clinical practitioner to complete your personal care plan. Depending upon the condition, your plan could have included both over the counter or prescription medications. Please review your pharmacy choice. If there is a problem, you may call our nursing hot line at and have the prescription routed to another pharmacy. Your safety is important to [place]. If you have drug allergies check your prescription carefully.   You can use MyChart to ask questions about today's visit, request a non-urgent call back, or ask for a work or school excuse for 24 hours related to this e-Visit. If it has been greater than 24 hours you will need to follow up with your provider, or enter a new e-Visit to address those concerns. You will get an e-mail in the next two days asking about your experience.  I hope that your e-visit has been valuable and will speed your recovery. Thank you for using e-visits.

## 2018-08-13 MED FILL — PANTOPRAZOLE SOD DR 40 MG T: 40 | 30 days supply | Qty: 30 | Fill #0

## 2018-09-14 MED FILL — PANTOPRAZOLE SOD DR 40 MG T: 40 | 30 days supply | Qty: 30 | Fill #0

## 2018-09-14 MED FILL — RESTASIS 0.05% EYE EMULSION: 0.05 | 90 days supply | Qty: 180 | Fill #3

## 2018-09-24 ENCOUNTER — Other Ambulatory Visit: Payer: Self-pay

## 2018-09-26 ENCOUNTER — Ambulatory Visit (INDEPENDENT_AMBULATORY_CARE_PROVIDER_SITE_OTHER): Payer: 59 | Admitting: Obstetrics & Gynecology

## 2018-09-26 ENCOUNTER — Encounter: Payer: Self-pay | Admitting: Obstetrics & Gynecology

## 2018-09-26 ENCOUNTER — Other Ambulatory Visit: Payer: Self-pay

## 2018-09-26 VITALS — BP 222/80 | Ht 62.0 in | Wt 145.0 lb

## 2018-09-26 DIAGNOSIS — K599 Functional intestinal disorder, unspecified: Secondary | ICD-10-CM | POA: Diagnosis not present

## 2018-09-26 DIAGNOSIS — R3989 Other symptoms and signs involving the genitourinary system: Secondary | ICD-10-CM | POA: Diagnosis not present

## 2018-09-26 DIAGNOSIS — N950 Postmenopausal bleeding: Secondary | ICD-10-CM

## 2018-09-26 DIAGNOSIS — Z780 Asymptomatic menopausal state: Secondary | ICD-10-CM | POA: Diagnosis not present

## 2018-09-26 DIAGNOSIS — Z01419 Encounter for gynecological examination (general) (routine) without abnormal findings: Secondary | ICD-10-CM

## 2018-09-26 DIAGNOSIS — N3946 Mixed incontinence: Secondary | ICD-10-CM | POA: Diagnosis not present

## 2018-09-26 DIAGNOSIS — N398 Other specified disorders of urinary system: Secondary | ICD-10-CM | POA: Diagnosis not present

## 2018-09-26 NOTE — Patient Instructions (Signed)
1. Well female exam with routine gynecological exam Normal gynecologic exam and menopause.  Pap test May 2019 was negative, no indication to repeat this year.  Breast exam normal.  Last screening mammogram December 2019 was negative.  Next colonoscopy due in 2021.  Health labs with family physician.  Body mass index 26.52 and good fitness with a healthy nutrition.  2. Postmenopause Well on no hormone replacement therapy.  Had very light postmenopausal spotting, will complete investigation with a pelvic ultrasound to verify the endometrial lining.  Recommend to continue vitamin D supplements, calcium intake of 1200 to 1500 mg daily and regular weightbearing physical activities to continue.  3. Postmenopausal bleeding Very light postmenopausal spotting x1 recently.  Normal gynecologic exam today.  Will complete investigation with a pelvic ultrasound at follow-up to evaluate the endometrial lining. - US Transvaginal Non-OB; Future  Other orders - VITAMIN D PO; Take 1 tablet by mouth daily.  Ashley Marshall, it was a pleasure seeing you today!

## 2018-09-26 NOTE — Progress Notes (Signed)
Ashley Marshall Oct 22, 1970 [id number]   History:    48 y.o. G3P2A1L2 Married.  Vasectomy.  Works at [employer] center.  Daughter 23, son 38.  RP:  Established patient presenting for annual gyn exam   HPI: Menopause x 1 year, well on no HRT.  FSH 92.9 in 02/2018.  Had very light brownish vaginal discharge that felt like a menstrual period recently.  No pelvic pain.  No pain with IC.  Breasts normal.  BMI 26.52.  Walking for fitness.  Health labs with Fam MD.  Referred to Urology for Urinary Urgency.  Past medical history,surgical history, family history and social history were all reviewed and documented in the EPIC chart.  Gynecologic History No LMP recorded. (Menstrual status: Irregular Periods). Contraception: post menopausal status and vasectomy Last Pap: 09/2017. Results were: Negative Last mammogram: 04/2018. Results were: Negative Bone Density: Never Colonoscopy: 2016 benign polyps, on a 5 yr schedule  Obstetric History OB History  Gravida Para Term Preterm AB Living  3 2     1 2   SAB TAB Ectopic Multiple Live Births  1            # Outcome Date GA Lbr Len/2nd Weight Sex Delivery Anes PTL Lv  3 SAB           2 Para           1 Para              ROS: A ROS was performed and pertinent positives and negatives are included in the history.  GENERAL: No fevers or chills. HEENT: No change in vision, no earache, sore throat or sinus congestion. NECK: No pain or stiffness. CARDIOVASCULAR: No chest pain or pressure. No palpitations. PULMONARY: No shortness of breath, cough or wheeze. GASTROINTESTINAL: No abdominal pain, nausea, vomiting or diarrhea, melena or bright red blood per rectum. GENITOURINARY: No urinary frequency, urgency, hesitancy or dysuria. MUSCULOSKELETAL: No joint or muscle pain, no back pain, no recent trauma. DERMATOLOGIC: No rash, no itching, no lesions. ENDOCRINE: No polyuria, polydipsia, no heat or cold intolerance. No recent change in weight. HEMATOLOGICAL: No  anemia or easy bruising or bleeding. NEUROLOGIC: No headache, seizures, numbness, tingling or weakness. PSYCHIATRIC: No depression, no loss of interest in normal activity or change in sleep pattern.     Exam:   BP (!) 222/80 (BP Location: Right Arm, Patient Position: Sitting, Cuff Size: Normal)   Ht 5\' 2"  (1.575 m)   Wt 145 lb (65.8 kg)   BMI 26.52 kg/m   Body mass index is 26.52 kg/m.  General appearance : Well developed well nourished female. No acute distress HEENT: Eyes: no retinal hemorrhage or exudates,  Neck supple, trachea midline, no carotid bruits, no thyroidmegaly Lungs: Clear to auscultation, no rhonchi or wheezes, or rib retractions  Heart: Regular rate and rhythm, no murmurs or gallops Breast:Examined in sitting and supine position were symmetrical in appearance, no palpable masses or tenderness,  no skin retraction, no nipple inversion, no nipple discharge, no skin discoloration, no axillary or supraclavicular lymphadenopathy Abdomen: no palpable masses or tenderness, no rebound or guarding Extremities: no edema or skin discoloration or tenderness  Pelvic: Vulva: Normal             Vagina: No gross lesions or discharge  Cervix: No gross lesions or discharge  Uterus  AV, normal size, shape and consistency, non-tender and mobile  Adnexa  Without masses or tenderness on the left, mildly tender on the right  Anus: Normal   Assessment/Plan:  48 y.o. female for annual exam   1. Well female exam with routine gynecological exam Normal gynecologic exam and menopause.  Pap test May 2019 was negative, no indication to repeat this year.  Breast exam normal.  Last screening mammogram December 2019 was negative.  Next colonoscopy due in 2021.  Health labs with family physician.  Body mass index 26.52 and good fitness with a healthy nutrition.  2. Postmenopause Well on no hormone replacement therapy.  Had very light postmenopausal spotting, will complete investigation with a  pelvic ultrasound to verify the endometrial lining.  Recommend to continue vitamin D supplements, calcium intake of 1200 to 1500 mg daily and regular weightbearing physical activities to continue.  3. Postmenopausal bleeding Very light postmenopausal spotting x1 recently.  Normal gynecologic exam today.  Will complete investigation with a pelvic ultrasound at follow-up to evaluate the endometrial lining. - US Transvaginal Non-OB; Future  Other orders - VITAMIN D PO; Take 1 tablet by mouth daily.  [name] MD, 8:18 AM 09/26/2018

## 2018-10-01 NOTE — Telephone Encounter (Signed)
We do not have a sooner appointment. Anything to recommend. Her appt is Thursday.

## 2018-10-02 ENCOUNTER — Other Ambulatory Visit: Payer: Self-pay

## 2018-10-02 NOTE — Telephone Encounter (Signed)
Patient called states right side is hurting, feels bloated,hurts when bending, Taking OTC to help ease the pain, but doesn't take the pain away. Bleeding is not heavy, passing clots and cramping.

## 2018-10-04 ENCOUNTER — Other Ambulatory Visit: Payer: Self-pay

## 2018-10-04 ENCOUNTER — Ambulatory Visit (INDEPENDENT_AMBULATORY_CARE_PROVIDER_SITE_OTHER): Payer: 59

## 2018-10-04 ENCOUNTER — Ambulatory Visit: Payer: 59 | Admitting: Obstetrics & Gynecology

## 2018-10-04 ENCOUNTER — Encounter: Payer: Self-pay | Admitting: Obstetrics & Gynecology

## 2018-10-04 VITALS — BP 124/70

## 2018-10-04 DIAGNOSIS — N950 Postmenopausal bleeding: Secondary | ICD-10-CM

## 2018-10-04 MED ORDER — MEDROXYPROGESTERONE ACETATE 5 MG PO TABS: 5.0000 mg | ORAL_TABLET | Freq: Every day | ORAL | 4 refills | Status: DC

## 2018-10-04 NOTE — Patient Instructions (Signed)
1. Postmenopausal bleeding Patient was in menopause for 1 year with no hormone replacement therapy.  She now had 2 episodes of postmenopausal bleeding which were probably 2 menstrual periods at 1 month apart.  Pelvic ultrasound today reviewed with patient.  Patient reassured that all the findings were normal including a thin normal endometrial lining at 2.7 mm.  Given that patient is now considered perimenopausal, decision to prescribe Provera 5 mg/tab 1 tablet daily for 10 days every 3 months if no spontaneous menstrual.  Patient explained that this is to protect her endometrium as she moves towards menopause.  Other orders - medroxyPROGESTERone (PROVERA) 5 MG tablet; Take 1 tablet (5 mg total) by mouth daily for 10 days. Cyclic Provera every 3 months if no spontaneous menstrual period.  Unique, it was a pleasure seeing you today!

## 2018-10-04 NOTE — Progress Notes (Signed)
    Ashley Marshall Dec 22, 1970 [id number]        48 y.o.  [id number] Married  RP: PMB for Pelvic US  HPI: Patient had a very light menstrual like.  At the end of April 2020 and now again just finished her menstrual period which lasted 4 days.  No pelvic pain.  No hot flushes/night sweats at this time.     OB History  Gravida Para Term Preterm AB Living  3 2     1 2   SAB TAB Ectopic Multiple Live Births  1            # Outcome Date GA Lbr Len/2nd Weight Sex Delivery Anes PTL Lv  3 SAB           2 Para           1 Para             Past medical history,surgical history, problem list, medications, allergies, family history and social history were all reviewed and documented in the EPIC chart.   Directed ROS with pertinent positives and negatives documented in the history of present illness/assessment and plan.  Exam:  Vitals:   10/04/18 0911  BP: 124/70   General appearance:  Normal  Pelvic US today: T/V images.  Retroverted uterus normal size measuring 6.98 x 5.97 x 4.06 cm.  No uterine mass seen.  Thin symmetrical endometrial lining measured at 2.7 mm, no thickening or mass seen.  Right ovary surgically absent.  Left ovary normal.  No adnexal mass.  No free fluid in the posterior cul-de-sac.   Assessment/Plan:  48 y.o. [id number]   1. Postmenopausal bleeding Patient was in menopause for 1 year with no hormone replacement therapy.  She now had 2 episodes of postmenopausal bleeding which were probably 2 menstrual periods at 1 month apart.  Pelvic ultrasound today reviewed with patient.  Patient reassured that all the findings were normal including a thin normal endometrial lining at 2.7 mm.  Given that patient is now considered perimenopausal, decision to prescribe Provera 5 mg/tab 1 tablet daily for 10 days every 3 months if no spontaneous menstrual.  Patient explained that this is to protect her endometrium as she moves towards menopause.  Other orders - medroxyPROGESTERone  (PROVERA) 5 MG tablet; Take 1 tablet (5 mg total) by mouth daily for 10 days. Cyclic Provera every 3 months if no spontaneous menstrual period.  Counseling on above issues and coordination of care more than 50% for 25 minutes.  [name] MD, 9:25 AM 10/04/2018

## 2018-10-05 ENCOUNTER — Telehealth: Payer: 59 | Admitting: Nurse Practitioner

## 2018-10-05 DIAGNOSIS — N390 Urinary tract infection, site not specified: Secondary | ICD-10-CM | POA: Diagnosis not present

## 2018-10-05 DIAGNOSIS — R1031 Right lower quadrant pain: Secondary | ICD-10-CM | POA: Diagnosis not present

## 2018-10-05 DIAGNOSIS — N3000 Acute cystitis without hematuria: Secondary | ICD-10-CM

## 2018-10-05 NOTE — Progress Notes (Signed)
Based on what you shared with me it looks like you have uti or kidney infection,that should be evaluated in a face to face office visit. You will need urinalysis and urine culture, because you have back pain and abdominal pin with this illness.   NOTE: If you entered your credit card information for this eVisit, you will not be charged. You may see a "hold" on your card for the $30 but that hold will drop off and you will not have a charge processed.  If you are having a true medical emergency please call 911.  If you need an urgent face to face visit, [facility] has four urgent care centers for your convenience.  If you need care fast and have a high deductible or no insurance consider:   [email or web address] to reserve your spot online an avoid wait times  [facility] [address], [address] [place], [place] [address] 8 am to 8 pm Monday-Friday 10 am to 4 pm Saturday-Sunday *Across the street from [employer]  [place], [address] 8 am to 5 pm Monday-Friday * In the [facility] on the [facility]   The following sites will take your  insurance:  . [facility] [place] a Provider at this Location  [address] [place], [place] [address] . 10 am to 8 pm Monday-Friday . 12 pm to 8 pm Saturday-Sunday   . [facility] at [place] a Provider at this Location  [address] [place], [place], [place] [address] . 8 am to 8 pm Monday-Friday . 9 am to 6 pm Saturday . 11 am to 6 pm Sunday   . [facility] at [place] Get Driving Directions  [address], [place] [address] . 8 am to 8 pm Monday-Friday . 8 am to 4 pm Saturday-Sunday   Your e-visit answers were reviewed by a board certified advanced clinical practitioner to  complete your personal care plan.  5-10 minutes spent reviewing and documenting in chart.

## 2018-10-09 MED FILL — AMOXICILLIN 500 MG CAPSULE: 500 | 14 days supply | Qty: 28 | Fill #0

## 2018-10-15 MED FILL — PANTOPRAZOLE SOD DR 40 MG T: 40 | 30 days supply | Qty: 30 | Fill #0

## 2018-10-24 DIAGNOSIS — N8111 Cystocele, midline: Secondary | ICD-10-CM | POA: Diagnosis not present

## 2018-10-24 DIAGNOSIS — R350 Frequency of micturition: Secondary | ICD-10-CM | POA: Diagnosis not present

## 2018-10-24 DIAGNOSIS — R339 Retention of urine, unspecified: Secondary | ICD-10-CM | POA: Diagnosis not present

## 2018-10-24 DIAGNOSIS — N816 Rectocele: Secondary | ICD-10-CM | POA: Diagnosis not present

## 2018-10-24 DIAGNOSIS — N9489 Other specified conditions associated with female genital organs and menstrual cycle: Secondary | ICD-10-CM | POA: Diagnosis not present

## 2018-10-24 DIAGNOSIS — N398 Other specified disorders of urinary system: Secondary | ICD-10-CM | POA: Diagnosis not present

## 2018-10-24 MED FILL — DIAZEPAM 10 MG TABS: 10 | 10 days supply | Qty: 30 | Fill #0

## 2018-11-01 DIAGNOSIS — H31002 Unspecified chorioretinal scars, left eye: Secondary | ICD-10-CM | POA: Diagnosis not present

## 2018-11-01 DIAGNOSIS — Z961 Presence of intraocular lens: Secondary | ICD-10-CM | POA: Diagnosis not present

## 2018-11-01 DIAGNOSIS — K5900 Constipation, unspecified: Secondary | ICD-10-CM | POA: Diagnosis not present

## 2018-11-01 DIAGNOSIS — N398 Other specified disorders of urinary system: Secondary | ICD-10-CM | POA: Diagnosis not present

## 2018-11-01 DIAGNOSIS — H04123 Dry eye syndrome of bilateral lacrimal glands: Secondary | ICD-10-CM | POA: Diagnosis not present

## 2018-11-01 DIAGNOSIS — N9489 Other specified conditions associated with female genital organs and menstrual cycle: Secondary | ICD-10-CM | POA: Diagnosis not present

## 2018-11-14 MED FILL — VALACYCLOVIR HCL 500 MG TAB: 500 | 30 days supply | Qty: 30 | Fill #0

## 2018-11-22 DIAGNOSIS — K5900 Constipation, unspecified: Secondary | ICD-10-CM | POA: Diagnosis not present

## 2018-11-22 DIAGNOSIS — N398 Other specified disorders of urinary system: Secondary | ICD-10-CM | POA: Diagnosis not present

## 2018-11-22 DIAGNOSIS — N9489 Other specified conditions associated with female genital organs and menstrual cycle: Secondary | ICD-10-CM | POA: Diagnosis not present

## 2018-12-04 DIAGNOSIS — F411 Generalized anxiety disorder: Secondary | ICD-10-CM | POA: Diagnosis not present

## 2018-12-05 DIAGNOSIS — N816 Rectocele: Secondary | ICD-10-CM | POA: Diagnosis not present

## 2018-12-05 DIAGNOSIS — N8111 Cystocele, midline: Secondary | ICD-10-CM | POA: Diagnosis not present

## 2018-12-06 DIAGNOSIS — N9489 Other specified conditions associated with female genital organs and menstrual cycle: Secondary | ICD-10-CM | POA: Diagnosis not present

## 2018-12-06 DIAGNOSIS — K5900 Constipation, unspecified: Secondary | ICD-10-CM | POA: Diagnosis not present

## 2018-12-06 DIAGNOSIS — N398 Other specified disorders of urinary system: Secondary | ICD-10-CM | POA: Diagnosis not present

## 2018-12-12 DIAGNOSIS — F411 Generalized anxiety disorder: Secondary | ICD-10-CM | POA: Diagnosis not present

## 2018-12-13 DIAGNOSIS — R399 Unspecified symptoms and signs involving the genitourinary system: Secondary | ICD-10-CM | POA: Diagnosis not present

## 2018-12-13 DIAGNOSIS — N398 Other specified disorders of urinary system: Secondary | ICD-10-CM | POA: Diagnosis not present

## 2018-12-13 DIAGNOSIS — N9489 Other specified conditions associated with female genital organs and menstrual cycle: Secondary | ICD-10-CM | POA: Diagnosis not present

## 2018-12-13 DIAGNOSIS — K5900 Constipation, unspecified: Secondary | ICD-10-CM | POA: Diagnosis not present

## 2018-12-19 ENCOUNTER — Ambulatory Visit: Payer: 59 | Attending: Obstetrics and Gynecology | Admitting: Physical Therapy

## 2018-12-19 ENCOUNTER — Encounter: Payer: Self-pay | Admitting: Physical Therapy

## 2018-12-19 ENCOUNTER — Other Ambulatory Visit: Payer: Self-pay

## 2018-12-19 DIAGNOSIS — R252 Cramp and spasm: Secondary | ICD-10-CM | POA: Diagnosis not present

## 2018-12-19 DIAGNOSIS — R278 Other lack of coordination: Secondary | ICD-10-CM | POA: Insufficient documentation

## 2018-12-19 DIAGNOSIS — N819 Female genital prolapse, unspecified: Secondary | ICD-10-CM | POA: Insufficient documentation

## 2018-12-19 DIAGNOSIS — M6281 Muscle weakness (generalized): Secondary | ICD-10-CM | POA: Insufficient documentation

## 2018-12-19 NOTE — Therapy (Signed)
[facility] [address], [place], [place], [address] Phone: [phone]   Fax:  [phone]  Physical Therapy Evaluation  Patient Details  Name: Ashley Marshall MRN: [id number] Date of Birth: Jun 14, 1970 Referring Provider (PT): Dr. Alta Corning   Encounter Date: 12/19/2018  PT End of Session - 12/19/18 1624    Visit Number  1    Date for PT Re-Evaluation  03/13/19    Authorization Type  [facility]    PT Start Time  0815    PT Stop Time  0900    PT Time Calculation (min)  45 min    Activity Tolerance  Patient tolerated treatment well;No increased pain    Behavior During Therapy  WFL for tasks assessed/performed       Past Medical History:  Diagnosis Date  . Constipation   . GERD (gastroesophageal reflux disease)    no meds - diet controlled  . Herpes   . History of kidney stones    passed stones - no surgery required  . HPV in female   . Hyperlipidemia    diet controlled - no meds  . Missed ab    no surgery required  . Seasonal allergies   . SVD (spontaneous vaginal delivery)    x 2    Past Surgical History:  Procedure Laterality Date  . CATARACT EXTRACTION Bilateral   . COLONOSCOPY     x 2 -hx polyps  . COLPOSCOPY    . DILATATION & CURETTAGE/HYSTEROSCOPY WITH MYOSURE N/A 11/04/2016   Procedure: DILATATION & CURETTAGE/HYSTEROSCOPY WITH MYOSURE;  Surgeon: [name], MD;  Location: [facility];  Service: Gynecology;  Laterality: N/A;  . RETINAL DETACHMENT SURGERY Left 2000  . ROBOTIC ASSISTED LAPAROSCOPIC OVARIAN CYSTECTOMY Left 11/04/2016   Procedure: ROBOTIC ASSISTED LAPAROSCOPIC OVARIAN CYSTECTOMY;  Surgeon: [name], MD;  Location: [facility];  Service: Gynecology;  Laterality: Left;  RNFA confirmed on 10/26/16 with chass  . ROBOTIC ASSISTED SALPINGO OOPHERECTOMY Right 11/04/2016   Procedure: ROBOTIC ASSISTED SALPINGO OOPHORECTOMY;  Surgeon: [name], MD;  Location: [facility];   Service: Gynecology;  Laterality: Right;  . WISDOM TOOTH EXTRACTION      There were no vitals filed for this visit.   Subjective Assessment - 12/19/18 5631    Subjective  Removed right ovary due to endometriosis on it and left the left ovary. Patient reports back pain. When urinating has to bend over and hard to have a bowel movement. I had a therapist in [facility] but she left. Re-evaluate in October for surgery to repair rectocele and cystocele.    Patient Stated Goals  exercise, how to manage prolapse, improve bowel movements, improve quality of life    Currently in Pain?  Yes    Pain Score  7     Pain Location  Back    Pain Orientation  Lower    Pain Descriptors / Indicators  Aching    Pain Type  Chronic pain    Pain Onset  More than a month ago    Pain Frequency  Intermittent    Aggravating Factors   sitting too long    Pain Relieving Factors  hot shower, tylenol, Alleve    Multiple Pain Sites  Yes    Pain Score  5    Pain Location  Abdomen    Pain Orientation  Right    Pain Descriptors / Indicators  Tightness    Pain Type  Chronic pain    Pain  Onset  More than a month ago    Pain Frequency  Intermittent    Aggravating Factors   sitting    Pain Relieving Factors  stretch         [facility] PT Assessment - 12/19/18 0001      Assessment   Medical Diagnosis  N81.9 Female genital prolapse, unspecified type    Referring Provider (PT)  Dr. Alta Corning    Onset Date/Surgical Date  --   2018   Prior Therapy  yes      Precautions   Precautions  None      Restrictions   Weight Bearing Restrictions  No      Balance Screen   Has the patient fallen in the past 6 months  No    Has the patient had a decrease in activity level because of a fear of falling?   No    Is the patient reluctant to leave their home because of a fear of falling?   No      Home Film/video editor residence      Prior Function   Level of Independence  Independent     Vocation  Full time employment    Vocation Requirements  sitting    Leisure  walking      Cognition   Overall Cognitive Status  Within Functional Limits for tasks assessed      Posture/Postural Control   Posture/Postural Control  Postural limitations    Postural Limitations  Rounded Shoulders;Forward head;Flexed trunk      ROM / Strength   AROM / PROM / Strength  AROM;PROM;Strength      AROM   Lumbar Extension  decreased by 25%    Lumbar - Right Side Bend  decreased by 25%    Lumbar - Left Side Bend  decreased 25%    Lumbar - Right Rotation  decreased by 25%    Lumbar - Left Rotation  decreased by 25%      Strength   Right Hip Flexion  4/5    Right Hip Extension  4/5    Right Hip External Rotation   3+/5    Right Hip Internal Rotation  4/5    Right Hip ABduction  3+/5    Right Hip ADduction  4/5    Left Hip Flexion  4/5    Left Hip Extension  4/5    Left Hip External Rotation  4/5    Left Hip Internal Rotation  4/5    Left Hip ABduction  3+/5    Left Hip ADduction  3+/5      Palpation   SI assessment   right ilium is rotated anteriorly, Sacrum rotated right,     Palpation comment  tenderness in right lower quadrant, superior umbilicus, tightness in midline of abdomen and suprapubically      Special Tests    Special Tests  Sacrolliac Tests    Sacroiliac Tests   Pelvic Distraction      Pelvic Dictraction   Findings  Positive    Side   Right    Comment  pain                Objective measurements completed on examination: See above findings.    Pelvic Floor Special Questions - 12/19/18 0001    Prior Pregnancies  Yes    Number of Pregnancies  2    Number of Vaginal Deliveries  2    Any difficulty  with labor and deliveries  Yes   grade 4 tear, vacuum assisted   Episiotomy Performed  Yes    Currently Sexually Active  Yes    Is this Painful  No    Urinary Leakage  No    Fecal incontinence  No    Skin Integrity  Intact   dryness   External  Palpation  tightness along the anterior anal sphincter    Pelvic Floor Internal Exam  Patient confirms identification and approves PT to assess and treat the pelvic floor    Exam Type  Vaginal;Rectal    Palpation  rectal strength 4/5; tenderness located on the ischiococcygeus and obturator internist; able to bulge the pelvic floor for BM, difficulty with contracting the puborectalis    Strength  weak squeeze, no lift   vaginal                PT Short Term Goals - 12/19/18 1635      PT SHORT TERM GOAL #1   Title  independent with initial HEP    Time  4    Period  Weeks    Status  New    Target Date  01/16/19      PT SHORT TERM GOAL #2   Title  pain in right lower quadrant decreased >/= 25% with sitting and walking    Time  4    Period  Weeks    Status  New    Target Date  01/16/19      PT SHORT TERM GOAL #3   Title  understand the correct toileting technique to empty the bladder and stool    Time  4    Period  Weeks    Status  New    Target Date  01/16/19      PT SHORT TERM GOAL #4   Title  able to demonstrate correct posture in sitting and standing to reduce tightness in abdomen and pelvic floor    Time  4    Period  Weeks    Status  New    Target Date  01/16/19        PT Long Term Goals - 12/19/18 1637      PT LONG TERM GOAL #1   Title  independent with HEP and understand how to progress herself    Time  12    Period  Weeks    Status  New    Target Date  03/13/19      PT LONG TERM GOAL #2   Title  able to urinate without bending over due to improved pelvic floor control    Time  12    Period  Weeks    Status  New    Target Date  03/13/19      PT LONG TERM GOAL #3   Title  pelvic floor strength is >/= 4/5 so if she has surgery she will have less complications    Time  12    Period  Weeks    Status  New    Target Date  03/13/19      PT LONG TERM GOAL #4   Title  able to walk for 30 minutes with minimal pain due to improved strength and  elongation of tissue    Time  12    Period  Weeks    Status  New    Target Date  03/13/19      PT LONG TERM GOAL #5   Title  able to wait  2 hours to urinate due to decreased urge to urinate and increased strength of pelvic floor    Time  12    Period  Weeks    Status  New    Target Date  03/13/19             Plan - 12/19/18 1625    Clinical Impression Statement  Patient is a 48 year old female with genital prolapse and pelvic pain. Patient reports her right lower quadrant abdominal pain is intermittent at level 5/10 and back pain is intermittent at level 6/10. Both pain sites increase with sitting and walking. Patient does not like to walk or leave her home due to pain and frequent urination. Patient reports she has to urinate every hour and has bend over to fully empty her bladder. Vaginal strength is 2/5 and anal strength is 4/5. Patient has difficulty with contracting the anal sphincter after she bulges it. Patient has tenderness located on ischiococcygeus, obturator internist, right lower quadrant and superior to the umbilicus. Bilateral hip strength ranges from 3+-4/5. Lumbar ROM decreased by 25%. Right ilium is rotated anteriorly and sacrum is rotated right with positive SI test on the right. Patient has a history of endometriosis and removal of right ovary. Patient may be having repair of her prolapse in the future. Patient has a flexed posture in sitting and standing. Patient will benefit from skilled therapy to improve posture, elongation of muscle to reduce pain, improve muscle coordination to reduce constipation and difficulty with urination.    Personal Factors and Comorbidities  Comorbidity 1;Comorbidity 2    Comorbidities  endometriosis, genital  prolapse    Examination-Activity Limitations  Locomotion Level;Sit;Toileting    Stability/Clinical Decision Making  Evolving/Moderate complexity    Clinical Decision Making  Moderate    Rehab Potential  Excellent    PT Frequency   1x / week    PT Duration  Other (comment)   4 months   PT Treatment/Interventions  ADLs/Self Care Home Management;Biofeedback;Cryotherapy;Electrical Stimulation;Moist Heat;Ultrasound;Therapeutic exercise;Therapeutic activities;Neuromuscular re-education;Patient/family education;Dry needling;Manual techniques;Taping;Spinal Manipulations    PT Next Visit Plan  work on pelvic floor, diaphragmatic breathing, toileting with bulging of the pelvic floor, stretches for pelvis and back, correct pelvic alignment, pelvic floor meditation    Consulted and Agree with Plan of Care  Patient       Patient will benefit from skilled therapeutic intervention in order to improve the following deficits and impairments:  Decreased coordination, Decreased range of motion, Increased fascial restricitons, Decreased endurance, Increased muscle spasms, Decreased activity tolerance, Pain, Impaired flexibility, Decreased mobility, Decreased strength, Postural dysfunction  Visit Diagnosis: 1. Muscle weakness (generalized)   2. Cramp and spasm   3. Other lack of coordination   4. Female genital prolapse, unspecified type        Problem List There are no active problems to display for this patient.   Earlie Counts, PT 12/19/18 4:43 PM   [facility] [address], [place], [place], [address] Phone: [phone]   Fax:  [phone]  Name: Ashley Marshall MRN: [id number] Date of Birth: [birth year]

## 2018-12-25 DIAGNOSIS — N951 Menopausal and female climacteric states: Secondary | ICD-10-CM | POA: Diagnosis not present

## 2018-12-25 DIAGNOSIS — R29898 Other symptoms and signs involving the musculoskeletal system: Secondary | ICD-10-CM | POA: Diagnosis not present

## 2018-12-27 DIAGNOSIS — F411 Generalized anxiety disorder: Secondary | ICD-10-CM | POA: Diagnosis not present

## 2019-01-01 ENCOUNTER — Encounter: Payer: Self-pay | Admitting: Physical Therapy

## 2019-01-01 ENCOUNTER — Other Ambulatory Visit: Payer: Self-pay

## 2019-01-01 ENCOUNTER — Ambulatory Visit: Payer: 59 | Admitting: Physical Therapy

## 2019-01-01 DIAGNOSIS — R278 Other lack of coordination: Secondary | ICD-10-CM

## 2019-01-01 DIAGNOSIS — N819 Female genital prolapse, unspecified: Secondary | ICD-10-CM

## 2019-01-01 DIAGNOSIS — R252 Cramp and spasm: Secondary | ICD-10-CM | POA: Diagnosis not present

## 2019-01-01 DIAGNOSIS — M6281 Muscle weakness (generalized): Secondary | ICD-10-CM

## 2019-01-01 NOTE — Patient Instructions (Signed)
Access Code: [email or web address]  URL: [email or web address]  Date: 01/01/2019  Prepared by: Earlie Counts   Exercises Seated Hamstring Stretch - 2 reps - 1 sets - 30 sec hold - 1x daily - 7x weekly Seated Piriformis Stretch with Trunk Bend - 2 reps - 1 sets - 30 sec hold - 1x daily - 7x weekly Seated Hip Flexor Stretch - 2 reps - 1 sets - 30 sec hold - 1x daily - 7x weekly Child's Pose Stretch - 2 reps - 1 sets - 30 sec hold - 1x daily - 7x weekly Childs Pose Side Bend - 2 reps - 1 sets - 30 sec hold - 1x daily - 7x weekly Supine Lower Trunk Rotation - 2 reps - 1 sets - 30 sec hold - 1x daily - 7x weekly Supine Butterfly Groin Stretch - 1 reps - 1 sets - 1 min hold - 1x daily - 7x weekly Supine Diaphragmatic Breathing - 10 reps - 1 sets - 1x daily - 7x weekly Patient Education Trigger [facility] [address] [place], [place] [address] Phone # [phone] Fax [phone]

## 2019-01-01 NOTE — Therapy (Signed)
[facility] [address], [place], [place], [address] Phone: [phone]   Fax:  [phone]  Physical Therapy Treatment  Patient Details  Name: Ashley Marshall MRN: [id number] Date of Birth: [birth year] Referring Provider (PT): Dr. Alta Corning   Encounter Date: 01/01/2019  PT End of Session - 01/01/19 0804    Visit Number  2    Date for PT Re-Evaluation  03/13/19    Authorization Type  UMR    PT Start Time  0804    PT Stop Time  0842    PT Time Calculation (min)  38 min    Activity Tolerance  Patient tolerated treatment well;No increased pain    Behavior During Therapy  WFL for tasks assessed/performed       Past Medical History:  Diagnosis Date  . Constipation   . GERD (gastroesophageal reflux disease)    no meds - diet controlled  . Herpes   . History of kidney stones    passed stones - no surgery required  . HPV in female   . Hyperlipidemia    diet controlled - no meds  . Missed ab    no surgery required  . Seasonal allergies   . SVD (spontaneous vaginal delivery)    x 2    Past Surgical History:  Procedure Laterality Date  . CATARACT EXTRACTION Bilateral   . COLONOSCOPY     x 2 -hx polyps  . COLPOSCOPY    . DILATATION & CURETTAGE/HYSTEROSCOPY WITH MYOSURE N/A 11/04/2016   Procedure: DILATATION & CURETTAGE/HYSTEROSCOPY WITH MYOSURE;  Surgeon: [name], MD;  Location: [facility];  Service: Gynecology;  Laterality: N/A;  . RETINAL DETACHMENT SURGERY Left 2000  . ROBOTIC ASSISTED LAPAROSCOPIC OVARIAN CYSTECTOMY Left 11/04/2016   Procedure: ROBOTIC ASSISTED LAPAROSCOPIC OVARIAN CYSTECTOMY;  Surgeon: [name], MD;  Location: [facility];  Service: Gynecology;  Laterality: Left;  RNFA confirmed on 10/26/16 with chass  . ROBOTIC ASSISTED SALPINGO OOPHERECTOMY Right 11/04/2016   Procedure: ROBOTIC ASSISTED SALPINGO OOPHORECTOMY;  Surgeon: [name], MD;  Location: [facility];   Service: Gynecology;  Laterality: Right;  . WISDOM TOOTH EXTRACTION      There were no vitals filed for this visit.  Subjective Assessment - 01/01/19 0808    Subjective  Patient feels alright. I saw the doctor for my balance and I trip over my feet. I am taking my Miralx daily so I am having a bowel movement daily. This has reduced my bladder spasms.    Patient Stated Goals  exercise, how to manage prolapse, improve bowel movements, improve quality of life    Currently in Pain?  Yes    Pain Score  7     Pain Location  Back    Pain Orientation  Lower    Pain Descriptors / Indicators  Aching    Pain Type  Chronic pain    Pain Onset  More than a month ago    Pain Frequency  Intermittent    Aggravating Factors   sitting too long    Pain Relieving Factors  hot shower, tylenol, Alleve    Multiple Pain Sites  No    Pain Score  5    Pain Location  Abdomen    Pain Orientation  Right    Pain Descriptors / Indicators  Tightness    Pain Type  Chronic pain    Pain Onset  More than a month ago    Pain Frequency  Intermittent  Aggravating Factors   sitting    Pain Relieving Factors  stretch                       [facility] Adult PT Treatment/Exercise - 01/01/19 0001      Neuro Re-ed    Neuro Re-ed Details   diaphragmatic breathing in supine      Lumbar Exercises: Stretches   Active Hamstring Stretch  Right;Left;1 rep;30 seconds   sitting   Lower Trunk Rotation  2 reps;30 seconds   supine, right, left   Hip Flexor Stretch  Right;Left;1 rep;30 seconds   sitting   Quadruped Mid Back Stretch  3 reps;30 seconds   all three positions   Piriformis Stretch  Right;Left;1 rep;30 seconds   sitting   Other Lumbar Stretch Exercise  butterfly stretch holding for 1 min      Manual Therapy   Manual Therapy  Soft tissue mobilization;Muscle Energy Technique    Soft tissue mobilization  right lumbar, iliopsoas, and gluteal    Muscle Energy Technique  correct the right ilium        Trigger Point Dry Needling - 01/01/19 0001    Consent Given?  Yes    Education Handout Provided  Yes    Muscles Treated Back/Hip  Lumbar multifidi;Gluteus medius;Piriformis;Iliopsoas   right   Gluteus Medius Response  Twitch response elicited;Palpable increased muscle length    Piriformis Response  Twitch response elicited;Palpable increased muscle length    Lumbar multifidi Response  Twitch response elicited;Palpable increased muscle length    Iliopsoas Response  Twitch response elicited;Palpable increased muscle length           PT Education - 01/01/19 0845    Education Details  Access Code: [email or web address]    Person(s) Educated  Patient    Methods  Explanation;Demonstration;Verbal cues;Handout    Comprehension  Returned demonstration;Verbalized understanding       PT Short Term Goals - 12/19/18 1635      PT SHORT TERM GOAL #1   Title  independent with initial HEP    Time  4    Period  Weeks    Status  New    Target Date  01/16/19      PT SHORT TERM GOAL #2   Title  pain in right lower quadrant decreased >/= 25% with sitting and walking    Time  4    Period  Weeks    Status  New    Target Date  01/16/19      PT SHORT TERM GOAL #3   Title  understand the correct toileting technique to empty the bladder and stool    Time  4    Period  Weeks    Status  New    Target Date  01/16/19      PT SHORT TERM GOAL #4   Title  able to demonstrate correct posture in sitting and standing to reduce tightness in abdomen and pelvic floor    Time  4    Period  Weeks    Status  New    Target Date  01/16/19        PT Long Term Goals - 12/19/18 1637      PT LONG TERM GOAL #1   Title  independent with HEP and understand how to progress herself    Time  12    Period  Weeks    Status  New    Target Date  03/13/19  PT LONG TERM GOAL #2   Title  able to urinate without bending over due to improved pelvic floor control    Time  12    Period  Weeks    Status  New     Target Date  03/13/19      PT LONG TERM GOAL #3   Title  pelvic floor strength is >/= 4/5 so if she has surgery she will have less complications    Time  12    Period  Weeks    Status  New    Target Date  03/13/19      PT LONG TERM GOAL #4   Title  able to walk for 30 minutes with minimal pain due to improved strength and elongation of tissue    Time  12    Period  Weeks    Status  New    Target Date  03/13/19      PT LONG TERM GOAL #5   Title  able to wait  2 hours to urinate due to decreased urge to urinate and increased strength of pelvic floor    Time  12    Period  Weeks    Status  New    Target Date  03/13/19            Plan - 01/01/19 0805    Clinical Impression Statement  Patient is having tests done due to her trouble with balance. Patient has not met goals due to just starting therapy. Patient pelvis in correct alignment after therapy. Patient is able to do diaphragmatic breathing to relax the pelvic floor. Patient has learned stretches to open up her hips. Patient will benefit from skilled therapy to improve posture elongation of muscle to reduce pain, improve muscle coordination to reduce constipation and difficulty with urination.    Personal Factors and Comorbidities  Comorbidity 1;Comorbidity 2    Comorbidities  endometriosis, genital  prolapse    Examination-Activity Limitations  Locomotion Level;Sit;Toileting    Stability/Clinical Decision Making  Evolving/Moderate complexity    Rehab Potential  Excellent    PT Frequency  1x / week    PT Duration  Other (comment)   4 months   PT Treatment/Interventions  ADLs/Self Care Home Management;Biofeedback;Cryotherapy;Electrical Stimulation;Moist Heat;Ultrasound;Therapeutic exercise;Therapeutic activities;Neuromuscular re-education;Patient/family education;Dry needling;Manual techniques;Taping;Spinal Manipulations    PT Next Visit Plan  work on pelvic floor, , correct pelvic alignment, pelvic floor meditation    PT  Home Exercise Plan  Access Code: [email or web address]    Consulted and Agree with Plan of Care  Patient       Patient will benefit from skilled therapeutic intervention in order to improve the following deficits and impairments:  Decreased coordination, Decreased range of motion, Increased fascial restricitons, Decreased endurance, Increased muscle spasms, Decreased activity tolerance, Pain, Impaired flexibility, Decreased mobility, Decreased strength, Postural dysfunction  Visit Diagnosis: 1. Muscle weakness (generalized)   2. Cramp and spasm   3. Other lack of coordination   4. Female genital prolapse, unspecified type        Problem List There are no active problems to display for this patient.   Earlie Counts, PT 01/01/19 8:48 AM   [facility] [address], [place], [place], [address] Phone: [phone]   Fax:  [phone]  Name: Ashley Marshall MRN: [id number] Date of Birth: [birth year]

## 2019-01-02 ENCOUNTER — Ambulatory Visit
Admission: RE | Admit: 2019-01-02 | Discharge: 2019-01-02 | Disposition: A | Discharge status: Home / Self Care | Payer: 59 | Source: Ambulatory Visit | Attending: Family Medicine | Admitting: Family Medicine

## 2019-01-02 ENCOUNTER — Other Ambulatory Visit: Payer: Self-pay

## 2019-01-02 ENCOUNTER — Other Ambulatory Visit: Payer: Self-pay | Admitting: Family Medicine

## 2019-01-02 DIAGNOSIS — M545 Low back pain: Secondary | ICD-10-CM | POA: Diagnosis not present

## 2019-01-02 DIAGNOSIS — R531 Weakness: Secondary | ICD-10-CM

## 2019-01-08 ENCOUNTER — Ambulatory Visit: Payer: 59 | Admitting: Physical Therapy

## 2019-01-08 ENCOUNTER — Encounter: Payer: Self-pay | Admitting: Physical Therapy

## 2019-01-08 ENCOUNTER — Other Ambulatory Visit: Payer: Self-pay

## 2019-01-08 DIAGNOSIS — R278 Other lack of coordination: Secondary | ICD-10-CM | POA: Diagnosis not present

## 2019-01-08 DIAGNOSIS — M6281 Muscle weakness (generalized): Secondary | ICD-10-CM

## 2019-01-08 DIAGNOSIS — R252 Cramp and spasm: Secondary | ICD-10-CM

## 2019-01-08 DIAGNOSIS — N819 Female genital prolapse, unspecified: Secondary | ICD-10-CM | POA: Diagnosis not present

## 2019-01-08 NOTE — Therapy (Signed)
[facility] [address] [place], [place], [address] Phone: [phone]   Fax:  [phone]  Physical Therapy Treatment  Patient Details  Name: Ashley Marshall MRN: [id number] Date of Birth: 25-Mar-1971 Referring Provider (PT): Dr. Alta Corning   Encounter Date: 01/08/2019  PT End of Session - 01/08/19 0801    Visit Number  3    Date for PT Re-Evaluation  03/13/19    Authorization Type  UMR    PT Start Time  0800    PT Stop Time  0840    PT Time Calculation (min)  40 min    Activity Tolerance  Patient tolerated treatment well;No increased pain    Behavior During Therapy  WFL for tasks assessed/performed       Past Medical History:  Diagnosis Date  . Constipation   . GERD (gastroesophageal reflux disease)    no meds - diet controlled  . Herpes   . History of kidney stones    passed stones - no surgery required  . HPV in female   . Hyperlipidemia    diet controlled - no meds  . Missed ab    no surgery required  . Seasonal allergies   . SVD (spontaneous vaginal delivery)    x 2    Past Surgical History:  Procedure Laterality Date  . CATARACT EXTRACTION Bilateral   . COLONOSCOPY     x 2 -hx polyps  . COLPOSCOPY    . DILATATION & CURETTAGE/HYSTEROSCOPY WITH MYOSURE N/A 11/04/2016   Procedure: DILATATION & CURETTAGE/HYSTEROSCOPY WITH MYOSURE;  Surgeon: [name], MD;  Location: [facility];  Service: Gynecology;  Laterality: N/A;  . RETINAL DETACHMENT SURGERY Left 2000  . ROBOTIC ASSISTED LAPAROSCOPIC OVARIAN CYSTECTOMY Left 11/04/2016   Procedure: ROBOTIC ASSISTED LAPAROSCOPIC OVARIAN CYSTECTOMY;  Surgeon: [name], MD;  Location: [facility];  Service: Gynecology;  Laterality: Left;  RNFA confirmed on 10/26/16 with chass  . ROBOTIC ASSISTED SALPINGO OOPHERECTOMY Right 11/04/2016   Procedure: ROBOTIC ASSISTED SALPINGO OOPHORECTOMY;  Surgeon: [name], MD;  Location: [facility];   Service: Gynecology;  Laterality: Right;  . WISDOM TOOTH EXTRACTION      There were no vitals filed for this visit.  Subjective Assessment - 01/08/19 0806    Subjective  Last night my low back became so tight. When I drive and put brakes on I feel tugging in the right groin. I have kept up with the exercises. I have not had abdominal pain. I still get a muscle spasm on the right.    Patient Stated Goals  exercise, how to manage prolapse, improve bowel movements, improve quality of life    Currently in Pain?  Yes    Pain Score  7     Pain Location  Back    Pain Orientation  Lower    Pain Descriptors / Indicators  Aching    Pain Type  Chronic pain    Pain Onset  More than a month ago    Pain Frequency  Intermittent    Aggravating Factors   sitting too long    Pain Relieving Factors  hot shower, tylenol, Alleve    Multiple Pain Sites  No                       [facility] Adult PT Treatment/Exercise - 01/08/19 0001      Self-Care   Self-Care  Other Self-Care Comments    Other Self-Care Comments  instruction on pelvic floor mediaiton to calm her system down; instruction on appropriate lubricants she can use      Lumbar Exercises: Aerobic   Nustep  level 4; seat #6; arm#10 ; 5 min while asssessing patient      Lumbar Exercises: Supine   Ab Set  3 seconds;10 reps   tactile cues to contract lower abdominals   Bridge  10 reps;1 second    Isometric Hip Flexion  10 reps;5 seconds   on each leg     Lumbar Exercises: Prone   Opposite Arm/Leg Raise  Right arm/Left leg;Left arm/Right leg;10 reps   wiht pelvic floor contraction     Manual Therapy   Manual Therapy  Soft tissue mobilization;Joint mobilization    Joint Mobilization  P-A and rotational mobilization to T10 -L1; right lower rib cage mobilization posterior    Soft tissue mobilization  lumbar paraspinals and right quadratus       Trigger Point Dry Needling - 01/08/19 0001    Consent Given?  Yes    Education  Handout Provided  Yes    Muscles Treated Back/Hip  Lumbar multifidi;Quadratus lumborum   right    Lumbar multifidi Response  Twitch response elicited;Palpable increased muscle length    Quadratus Lumborum Response  Twitch response elicited;Palpable increased muscle length           PT Education - 01/08/19 0845    Education Details  Access Code: [email or web address]; information on pelvic floor meditation and vaginal lubricants    Person(s) Educated  Patient    Methods  Explanation;Demonstration;Verbal cues;Handout    Comprehension  Verbalized understanding;Returned demonstration       PT Short Term Goals - 01/08/19 0838      PT SHORT TERM GOAL #1   Title  independent with initial HEP    Time  4    Period  Weeks    Status  Achieved    Target Date  01/16/19      PT SHORT TERM GOAL #2   Title  pain in right lower quadrant decreased >/= 25% with sitting and walking    Time  4    Period  Weeks    Status  Achieved    Target Date  01/16/19      PT SHORT TERM GOAL #3   Title  understand the correct toileting technique to empty the bladder and stool    Time  4    Period  Weeks    Status  Achieved    Target Date  01/16/19      PT SHORT TERM GOAL #4   Title  able to demonstrate correct posture in sitting and standing to reduce tightness in abdomen and pelvic floor    Time  4    Period  Weeks    Status  On-going    Target Date  01/16/19        PT Long Term Goals - 12/19/18 1637      PT LONG TERM GOAL #1   Title  independent with HEP and understand how to progress herself    Time  12    Period  Weeks    Status  New    Target Date  03/13/19      PT LONG TERM GOAL #2   Title  able to urinate without bending over due to improved pelvic floor control    Time  12    Period  Weeks    Status  New  Target Date  03/13/19      PT LONG TERM GOAL #3   Title  pelvic floor strength is >/= 4/5 so if she has surgery she will have less complications    Time  12    Period  Weeks     Status  New    Target Date  03/13/19      PT LONG TERM GOAL #4   Title  able to walk for 30 minutes with minimal pain due to improved strength and elongation of tissue    Time  12    Period  Weeks    Status  New    Target Date  03/13/19      PT LONG TERM GOAL #5   Title  able to wait  2 hours to urinate due to decreased urge to urinate and increased strength of pelvic floor    Time  12    Period  Weeks    Status  New    Target Date  03/13/19            Plan - 01/08/19 [id number]    Clinical Impression Statement  Patient is not having abdominal pain but she is having back pain. Patient has improved mobility of thoracic lumbar junction. Patient needs tactile cues to engage the lower abdominals. Patient has been consistent with her HEP and doing her toileting. She is using her Miralax and correct toileting technique to keep a good bowel regimen. Patient will benefit from skilled therapy to improve posture to reduce pain, improve muscle coordination to reduce constipation and difficulty with urination.    Personal Factors and Comorbidities  Comorbidity 1;Comorbidity 2    Comorbidities  endometriosis, genital  prolapse    Stability/Clinical Decision Making  Evolving/Moderate complexity    PT Frequency  1x / week    PT Duration  Other (comment)   4 months   PT Treatment/Interventions  ADLs/Self Care Home Management;Biofeedback;Cryotherapy;Electrical Stimulation;Moist Heat;Ultrasound;Therapeutic exercise;Therapeutic activities;Neuromuscular re-education;Patient/family education;Dry needling;Manual techniques;Taping;Spinal Manipulations    PT Next Visit Plan  core strength, assess pelvic floor strength, see if easier to urinate    PT Home Exercise Plan  Access Code: [email or web address]    Consulted and Agree with Plan of Care  Patient       Patient will benefit from skilled therapeutic intervention in order to improve the following deficits and impairments:  Decreased coordination, Decreased range of  motion, Increased fascial restricitons, Decreased endurance, Increased muscle spasms, Decreased activity tolerance, Pain, Impaired flexibility, Decreased mobility, Decreased strength, Postural dysfunction  Visit Diagnosis: Muscle weakness (generalized)  Cramp and spasm  Other lack of coordination  Female genital prolapse, unspecified type     Problem List There are no active problems to display for this patient.   Earlie Counts, PT 01/08/19 8:46 AM   [facility] [address] [place], [place], [address] Phone: [phone]   Fax:  [phone]  Name: Ashley Marshall MRN: [id number] Date of Birth: 09-Jan-1971

## 2019-01-08 NOTE — Patient Instructions (Addendum)
Guided Meditation for Pelvic Floor Relaxation  [employer]em[employer]itness on you tube   Lubrication . Used for intercourse to reduce friction . Avoid ones that have glycerin, warming gels, tingling gels, icing or cooling gel, scented . Avoid parabens due to a preservative similar to female sex hormone . May need to be reapplied once or several times during sexual activity . Can be applied to both partners genitals prior to vaginal penetration to minimize friction or irritation . Prevent irritation and mucosal tears that cause post coital pain and increased the risk of vaginal and urinary tract infections . Oil-based lubricants cannot be used with condoms due to breaking them down.  Least likely to irritate vaginal tissue.  . Plant based-lubes are safe . Silicone-based lubrication are thicker and last long and used for post-menopausal women  Vaginal Lubricators Here is a list of some suggested lubricators you can use for intercourse. Use the most hypoallergenic product.  You can place on you or your partner.   Slippery Stuff  Sylk or Sliquid Natural H2O ( good  if frequent UTI's)  Blossom Organics ([email or web address]-organics.com)  Luvena   Coconut oil  PJur Woman Nude- water based lubricant, amazon  Uberlube- Amazon  Aloe Vera  Yes lubricant- [employer] Platinum-Silicone, Target, [facility]  Olive and Bee intimate cream-  www.oliveandbee.com.au  Pink - Amazon Things to avoid in lubricants are glycerin, warming gels, tingling gels, icing or cooling  gels, and scented gels.  Also avoid Vaseline. KY jelly, Replens, and Astroglide kills good bacteria(lactobacilli)  Things to avoid in the vaginal area . Do not use things to irritate the vulvar area . No lotions- see below . No soaps; can use Aveeno or Calendula cleanser if needed. Must be gentle . No deodorants . No douches . Good to sleep without underwear to let the vaginal area to air out . No scrubbing: spread the lips to let  warm water rinse over labias and pat dry  Creams that can be used on the [employer] or [employer]   Access Code: [email or web address]  URL: [email or web address]  Date: 01/08/2019  Prepared by: Earlie Counts   Exercises Seated Hamstring Stretch - 2 reps - 1 sets - 30 sec hold - 1x daily - 7x weekly Seated Piriformis Stretch with Trunk Bend - 2 reps - 1 sets - 30 sec hold - 1x daily - 7x weekly Seated Hip Flexor Stretch - 2 reps - 1 sets - 30 sec hold - 1x daily - 7x weekly Child's Pose Stretch - 2 reps - 1 sets - 30 sec hold - 1x daily - 7x weekly Childs Pose Side Bend - 2 reps - 1 sets - 30 sec hold - 1x daily - 7x weekly Supine Lower Trunk Rotation - 2 reps - 1 sets - 30 sec hold - 1x daily - 7x weekly Supine Butterfly Groin Stretch - 1 reps - 1 sets - 1 min hold - 1x daily - 7x weekly Supine Diaphragmatic Breathing - 10 reps - 1 sets - 1x daily - 7x weekly Supine Bridge - 10 reps - 1 sets - 1x daily - 7x weekly Hooklying Isometric Hip Flexion - 10 reps - 1 sets - 5 sec hold - 1x daily - 7x weekly Hooklying Transversus Abdominis Palpation - 10 reps - 1 sets - 5 sec hold - 1x daily - 7x  weekly Patient Education Trigger [facility] [address] [place], [place] [address] Phone # [phone] Fax [phone]

## 2019-01-10 MED FILL — RESTASIS 0.05% EYE EMULSION: 0.05 | 90 days supply | Qty: 180 | Fill #0

## 2019-01-17 ENCOUNTER — Other Ambulatory Visit: Payer: Self-pay

## 2019-01-17 ENCOUNTER — Ambulatory Visit: Payer: 59 | Attending: Obstetrics and Gynecology | Admitting: Physical Therapy

## 2019-01-17 ENCOUNTER — Encounter: Payer: Self-pay | Admitting: Physical Therapy

## 2019-01-17 DIAGNOSIS — M6281 Muscle weakness (generalized): Secondary | ICD-10-CM | POA: Diagnosis not present

## 2019-01-17 DIAGNOSIS — N819 Female genital prolapse, unspecified: Secondary | ICD-10-CM | POA: Diagnosis not present

## 2019-01-17 DIAGNOSIS — R278 Other lack of coordination: Secondary | ICD-10-CM | POA: Insufficient documentation

## 2019-01-17 DIAGNOSIS — R252 Cramp and spasm: Secondary | ICD-10-CM | POA: Diagnosis not present

## 2019-01-17 NOTE — Therapy (Signed)
[facility] [address], [place], [place], [address] Phone: [phone]   Fax:  [phone]  Physical Therapy Treatment  Patient Details  Name: Ashley Marshall MRN: [id number] Date of Birth: 11-Jun-1970 Referring Provider (PT): Dr. Alta Corning   Encounter Date: 01/17/2019  PT End of Session - 01/17/19 0844    Visit Number  4    Date for PT Re-Evaluation  03/13/19    Authorization Type  UMR    PT Start Time  0800    PT Stop Time  0839    PT Time Calculation (min)  39 min    Activity Tolerance  Patient tolerated treatment well;No increased pain    Behavior During Therapy  WFL for tasks assessed/performed       Past Medical History:  Diagnosis Date  . Constipation   . GERD (gastroesophageal reflux disease)    no meds - diet controlled  . Herpes   . History of kidney stones    passed stones - no surgery required  . HPV in female   . Hyperlipidemia    diet controlled - no meds  . Missed ab    no surgery required  . Seasonal allergies   . SVD (spontaneous vaginal delivery)    x 2    Past Surgical History:  Procedure Laterality Date  . CATARACT EXTRACTION Bilateral   . COLONOSCOPY     x 2 -hx polyps  . COLPOSCOPY    . DILATATION & CURETTAGE/HYSTEROSCOPY WITH MYOSURE N/A 11/04/2016   Procedure: DILATATION & CURETTAGE/HYSTEROSCOPY WITH MYOSURE;  Surgeon: [name], MD;  Location: [facility];  Service: Gynecology;  Laterality: N/A;  . RETINAL DETACHMENT SURGERY Left 2000  . ROBOTIC ASSISTED LAPAROSCOPIC OVARIAN CYSTECTOMY Left 11/04/2016   Procedure: ROBOTIC ASSISTED LAPAROSCOPIC OVARIAN CYSTECTOMY;  Surgeon: [name], MD;  Location: [facility];  Service: Gynecology;  Laterality: Left;  RNFA confirmed on 10/26/16 with chass  . ROBOTIC ASSISTED SALPINGO OOPHERECTOMY Right 11/04/2016   Procedure: ROBOTIC ASSISTED SALPINGO OOPHORECTOMY;  Surgeon: [name], MD;  Location: [facility];   Service: Gynecology;  Laterality: Right;  . WISDOM TOOTH EXTRACTION      There were no vitals filed for this visit.  Subjective Assessment - 01/17/19 0808    Subjective  I am doing pretty good. NO abdominal pain. I had some bloating and was constipated. I am not sure I am doing the abdominal massage correctly. If I sit there I deep breathe to fully empty the bladder.    Patient Stated Goals  exercise, how to manage prolapse, improve bowel movements, improve quality of life    Currently in Pain?  No/denies    Multiple Pain Sites  No                       [facility] Adult PT Treatment/Exercise - 01/17/19 0001      Therapeutic Activites    Therapeutic Activities  Other Therapeutic Activities    Other Therapeutic Activities  lay with hips elevated and cup the lower abdoemn and lift to take pressure offf the bladder      Lumbar Exercises: Aerobic   Recumbent Bike  level 3 for 6 min      Lumbar Exercises: Supine   Other Supine Lumbar Exercises  lay on foam roll with arms outward to decompress the spine with diaphragmatic breathing; Alternate shoulder flexion; have ball across the spine to increase extension of the thoracic  Manual Therapy   Manual Therapy  Soft tissue mobilization;Myofascial release    Manual therapy comments  educated patient on how to perform abdominal massage and work the lower quadrant by lifting the intestines,     Soft tissue mobilization  with hips elevated released the right iliopsoas with movement of the right hip and pressure on the muscle    Myofascial Release  release with hips elevated to release the lower intestines off the bladder, lift up the iliocecal valve, lift the mesenteric root, movement of the bladder sdie to side, release of the urachus ligament               PT Short Term Goals - 01/08/19 [id number]      PT SHORT TERM GOAL #1   Title  independent with initial HEP    Time  4    Period  Weeks    Status  Achieved    Target Date   01/16/19      PT SHORT TERM GOAL #2   Title  pain in right lower quadrant decreased >/= 25% with sitting and walking    Time  4    Period  Weeks    Status  Achieved    Target Date  01/16/19      PT SHORT TERM GOAL #3   Title  understand the correct toileting technique to empty the bladder and stool    Time  4    Period  Weeks    Status  Achieved    Target Date  01/16/19      PT SHORT TERM GOAL #4   Title  able to demonstrate correct posture in sitting and standing to reduce tightness in abdomen and pelvic floor    Time  4    Period  Weeks    Status  On-going    Target Date  01/16/19        PT Long Term Goals - 12/19/18 1637      PT LONG TERM GOAL #1   Title  independent with HEP and understand how to progress herself    Time  12    Period  Weeks    Status  New    Target Date  03/13/19      PT LONG TERM GOAL #2   Title  able to urinate without bending over due to improved pelvic floor control    Time  12    Period  Weeks    Status  New    Target Date  03/13/19      PT LONG TERM GOAL #3   Title  pelvic floor strength is >/= 4/5 so if she has surgery she will have less complications    Time  12    Period  Weeks    Status  New    Target Date  03/13/19      PT LONG TERM GOAL #4   Title  able to walk for 30 minutes with minimal pain due to improved strength and elongation of tissue    Time  12    Period  Weeks    Status  New    Target Date  03/13/19      PT LONG TERM GOAL #5   Title  able to wait  2 hours to urinate due to decreased urge to urinate and increased strength of pelvic floor    Time  12    Period  Weeks    Status  New  Target Date  03/13/19            Plan - 01/17/19 0817    Clinical Impression Statement  Patient had some constipation which put pressure on her prolapse. Patient had fascial tightness along the lower abdomen and bladder area. Patient had a trigger point on her right iliopsoas. Patient is not having urinary leakage. Patient  understands how to perform abdominal massage correctly. Patient understands to lay with hips lifted to reduce prolapse. Patient still has to bend forward to empty her bladder but understnads to do relaxation breathing for full relaxation of pelvic floor. Patient will benefit from skilled therapy to improve posture to reduce pain, improve muscle coordination to reduce constipation and difficulty with urnation.    Personal Factors and Comorbidities  Comorbidity 1;Comorbidity 2    Comorbidities  endometriosis, genital  prolapse    Examination-Activity Limitations  Locomotion Level;Sit;Toileting    Stability/Clinical Decision Making  Evolving/Moderate complexity    Rehab Potential  Excellent    PT Frequency  1x / week    PT Duration  Other (comment)   4 months   PT Treatment/Interventions  ADLs/Self Care Home Management;Biofeedback;Cryotherapy;Electrical Stimulation;Moist Heat;Ultrasound;Therapeutic exercise;Therapeutic activities;Neuromuscular re-education;Patient/family education;Dry needling;Manual techniques;Taping;Spinal Manipulations    PT Next Visit Plan  core strength, assess pelvic floor strength, see if easier to urinate: posture in sitting and standing    PT Home Exercise Plan  Access Code: [email or web address]    Consulted and Agree with Plan of Care  Patient       Patient will benefit from skilled therapeutic intervention in order to improve the following deficits and impairments:  Decreased coordination, Decreased range of motion, Increased fascial restricitons, Decreased endurance, Increased muscle spasms, Decreased activity tolerance, Pain, Impaired flexibility, Decreased mobility, Decreased strength, Postural dysfunction  Visit Diagnosis: Muscle weakness (generalized)  Cramp and spasm  Other lack of coordination  Female genital prolapse, unspecified type     Problem List There are no active problems to display for this patient.   Earlie Counts, PT 01/17/19 8:49 AM   [facility] [address], [place], [place], [address] Phone: [phone]   Fax:  [phone]  Name: Ashley Marshall MRN: [id number] Date of Birth: 09-01-1970

## 2019-01-23 ENCOUNTER — Other Ambulatory Visit: Payer: Self-pay

## 2019-01-23 ENCOUNTER — Ambulatory Visit: Payer: 59 | Admitting: Physical Therapy

## 2019-01-23 ENCOUNTER — Encounter: Payer: Self-pay | Admitting: Physical Therapy

## 2019-01-23 DIAGNOSIS — M6281 Muscle weakness (generalized): Secondary | ICD-10-CM

## 2019-01-23 DIAGNOSIS — R252 Cramp and spasm: Secondary | ICD-10-CM

## 2019-01-23 DIAGNOSIS — N819 Female genital prolapse, unspecified: Secondary | ICD-10-CM | POA: Diagnosis not present

## 2019-01-23 DIAGNOSIS — R278 Other lack of coordination: Secondary | ICD-10-CM

## 2019-01-23 NOTE — Therapy (Signed)
[facility] [address] [place], [place], [address] Phone: [phone]   Fax:  [phone]  Physical Therapy Treatment  Patient Details  Name: Ashley Marshall MRN: [id number] Date of Birth: Oct 24, 1970 Referring Provider (PT): Dr. Alta Corning   Encounter Date: 01/23/2019  PT End of Session - 01/23/19 0819    Visit Number  5    Date for PT Re-Evaluation  03/13/19    Authorization Type  UMR    PT Start Time  [phone]   had to go to the bathroom   PT Stop Time  0858    PT Time Calculation (min)  39 min    Activity Tolerance  Patient tolerated treatment well;No increased pain    Behavior During Therapy  WFL for tasks assessed/performed       Past Medical History:  Diagnosis Date  . Constipation   . GERD (gastroesophageal reflux disease)    no meds - diet controlled  . Herpes   . History of kidney stones    passed stones - no surgery required  . HPV in female   . Hyperlipidemia    diet controlled - no meds  . Missed ab    no surgery required  . Seasonal allergies   . SVD (spontaneous vaginal delivery)    x 2    Past Surgical History:  Procedure Laterality Date  . CATARACT EXTRACTION Bilateral   . COLONOSCOPY     x 2 -hx polyps  . COLPOSCOPY    . DILATATION & CURETTAGE/HYSTEROSCOPY WITH MYOSURE N/A 11/04/2016   Procedure: DILATATION & CURETTAGE/HYSTEROSCOPY WITH MYOSURE;  Surgeon: [name], MD;  Location: [facility];  Service: Gynecology;  Laterality: N/A;  . RETINAL DETACHMENT SURGERY Left 2000  . ROBOTIC ASSISTED LAPAROSCOPIC OVARIAN CYSTECTOMY Left 11/04/2016   Procedure: ROBOTIC ASSISTED LAPAROSCOPIC OVARIAN CYSTECTOMY;  Surgeon: [name], MD;  Location: [facility];  Service: Gynecology;  Laterality: Left;  RNFA confirmed on 10/26/16 with chass  . ROBOTIC ASSISTED SALPINGO OOPHERECTOMY Right 11/04/2016   Procedure: ROBOTIC ASSISTED SALPINGO OOPHORECTOMY;  Surgeon: [name],  MD;  Location: [facility];  Service: Gynecology;  Laterality: Right;  . WISDOM TOOTH EXTRACTION      There were no vitals filed for this visit.  Subjective Assessment - 01/23/19 [id number]    Subjective  Patient reports no bladder spasms in the past week and has not used the valium. I had less intense back spasms. I still feel the tugging sensation in the right lower abdominal. I have done my stretches and working on my walks. I have focused on my posture.    Patient Stated Goals  exercise, how to manage prolapse, improve bowel movements, improve quality of life    Currently in Pain?  Yes    Pain Score  4     Pain Location  Back    Pain Orientation  Lower    Pain Descriptors / Indicators  Aching    Pain Type  Chronic pain    Pain Onset  More than a month ago    Pain Frequency  Intermittent    Aggravating Factors   sitting too long    Pain Relieving Factors  hot shower, tylenol, Alleve    Multiple Pain Sites  No                       [facility] Adult PT Treatment/Exercise - 01/23/19 0001      Lumbar Exercises: Aerobic  Nustep  level 4; seat #6; arm#10 ; 5 min while asssessing patient      Lumbar Exercises: Standing   Other Standing Lumbar Exercises  squat with correct alignment 15 times with pelvci floor contraction      Lumbar Exercises: Supine   Clam  10 reps;1 second   each side     Lumbar Exercises: Sidelying   Hip Abduction  Right;Left;10 reps;1 second   tighten the pelvic floor     Lumbar Exercises: Prone   Opposite Arm/Leg Raise  Right arm/Left leg;Left arm/Right leg;10 reps;1 second      Manual Therapy   Manual Therapy  Soft tissue mobilization;Myofascial release    Myofascial Release  release with hips elevated to release the lower intestines off the bladder, lift up the iliocecal valve, lift the mesenteric root, movement of the bladder sdie to side, release of the urachus ligament             PT Education - 01/23/19 0853    Education Details  Access  Code: [email or web address]    Person(s) Educated  Patient    Methods  Explanation;Demonstration;Verbal cues;Handout    Comprehension  Verbalized understanding;Returned demonstration       PT Short Term Goals - 01/23/19 0824      PT SHORT TERM GOAL #1   Title  independent with initial HEP    Time  4    Period  Weeks    Status  Achieved    Target Date  01/16/19      PT SHORT TERM GOAL #2   Title  pain in right lower quadrant decreased >/= 25% with sitting and walking    Time  4    Period  Weeks    Status  Achieved    Target Date  01/16/19      PT SHORT TERM GOAL #3   Title  understand the correct toileting technique to empty the bladder and stool    Time  4    Period  Weeks    Status  Achieved    Target Date  01/16/19      PT SHORT TERM GOAL #4   Title  able to demonstrate correct posture in sitting and standing to reduce tightness in abdomen and pelvic floor    Time  4    Period  Weeks    Status  Achieved    Target Date  01/16/19        PT Long Term Goals - 01/23/19 [id number]      PT LONG TERM GOAL #1   Title  independent with HEP and understand how to progress herself    Time  12    Period  Weeks    Status  On-going      PT LONG TERM GOAL #2   Title  able to urinate without bending over due to improved pelvic floor control    Time  12    Period  Weeks    Status  On-going      PT LONG TERM GOAL #3   Title  pelvic floor strength is >/= 4/5 so if she has surgery she will have less complications    Time  12    Period  Weeks    Status  On-going      PT LONG TERM GOAL #4   Title  able to walk for 30 minutes with minimal pain due to improved strength and elongation of tissue    Time  12  Period  Weeks    Status  On-going      PT LONG TERM GOAL #5   Title  able to wait  2 hours to urinate due to decreased urge to urinate and increased strength of pelvic floor    Time  12    Period  Weeks    Status  On-going            Plan - 01/23/19 0820    Clinical  Impression Statement  Patient reports she is still taking her Miralax daily for her stool movements. Patient has not had any bladder spasms or taking any valium since last visit. Patient is not straining as much to have a bowel movement. Patient is learning more progressive core strengthening. Patient had some fascial tightness in the right lower abdominal and mid lower abdominal region. Patient will benefit from skilled therapy to improve posture to reduce pain, improve muscle coordination to reduce constipation and difficulty with urination.    Personal Factors and Comorbidities  Comorbidity 1;Comorbidity 2    Comorbidities  endometriosis, genital  prolapse    Examination-Activity Limitations  Locomotion Level;Sit;Toileting    Stability/Clinical Decision Making  Evolving/Moderate complexity    Rehab Potential  Excellent    PT Frequency  1x / week    PT Duration  Other (comment)   4 months   PT Treatment/Interventions  ADLs/Self Care Home Management;Biofeedback;Cryotherapy;Electrical Stimulation;Moist Heat;Ultrasound;Therapeutic exercise;Therapeutic activities;Neuromuscular re-education;Patient/family education;Dry needling;Manual techniques;Taping;Spinal Manipulations    PT Next Visit Plan  core strength and back strength, assess pelvic floor strength, work internally to see is muscles are coordinated    PT Home Exercise Plan  Access Code: [email or web address]    Consulted and Agree with Plan of Care  Patient       Patient will benefit from skilled therapeutic intervention in order to improve the following deficits and impairments:  Decreased coordination, Decreased range of motion, Increased fascial restricitons, Decreased endurance, Increased muscle spasms, Decreased activity tolerance, Pain, Impaired flexibility, Decreased mobility, Decreased strength, Postural dysfunction  Visit Diagnosis: Muscle weakness (generalized)  Cramp and spasm  Other lack of coordination  Female genital prolapse,  unspecified type     Problem List There are no active problems to display for this patient.   Earlie Counts, PT 01/23/19 8:59 AM   [facility] [address] [place], [place], [address] Phone: [phone]   Fax:  [phone]  Name: POTA HANIS MRN: [id number] Date of Birth: [birth year]

## 2019-01-23 NOTE — Patient Instructions (Signed)
Access Code: [email or web address]  URL: [email or web address]  Date: 01/23/2019  Prepared by: Earlie Counts   Exercises Seated Hamstring Stretch - 2 reps - 1 sets - 30 sec hold - 1x daily - 7x weekly Seated Piriformis Stretch with Trunk Bend - 2 reps - 1 sets - 30 sec hold - 1x daily - 7x weekly Seated Hip Flexor Stretch - 2 reps - 1 sets - 30 sec hold - 1x daily - 7x weekly Child's Pose Stretch - 2 reps - 1 sets - 30 sec hold - 1x daily - 7x weekly Childs Pose Side Bend - 2 reps - 1 sets - 30 sec hold - 1x daily - 7x weekly Supine Lower Trunk Rotation - 2 reps - 1 sets - 30 sec hold - 1x daily - 7x weekly Supine Butterfly Groin Stretch - 1 reps - 1 sets - 1 min hold - 1x daily - 7x weekly Supine Diaphragmatic Breathing - 10 reps - 1 sets - 1x daily - 7x weekly Supine Bridge - 10 reps - 1 sets - 1x daily - 7x weekly Hooklying Isometric Hip Flexion - 10 reps - 1 sets - 5 sec hold - 1x daily - 7x weekly Bent Knee Fallouts - 10 reps - 2 sets - 1x daily - 7x weekly Prone Alternating Arm and Leg Lifts - 10 reps - 2 sets - 1x daily - 7x weekly Squat - 10 reps - 1 sets - 1x daily - 7x weekly Patient Education Trigger [facility] Dry Needling  [facility] [address] [place],  [address] Phone # [phone] Fax [phone]

## 2019-01-30 ENCOUNTER — Encounter: Payer: 59 | Admitting: Physical Therapy

## 2019-01-31 ENCOUNTER — Ambulatory Visit: Payer: 59 | Admitting: Physical Therapy

## 2019-01-31 ENCOUNTER — Encounter: Payer: Self-pay | Admitting: Physical Therapy

## 2019-01-31 ENCOUNTER — Other Ambulatory Visit: Payer: Self-pay

## 2019-01-31 DIAGNOSIS — R252 Cramp and spasm: Secondary | ICD-10-CM | POA: Diagnosis not present

## 2019-01-31 DIAGNOSIS — N819 Female genital prolapse, unspecified: Secondary | ICD-10-CM

## 2019-01-31 DIAGNOSIS — R278 Other lack of coordination: Secondary | ICD-10-CM

## 2019-01-31 DIAGNOSIS — M6281 Muscle weakness (generalized): Secondary | ICD-10-CM | POA: Diagnosis not present

## 2019-01-31 NOTE — Patient Instructions (Addendum)
Lay with your hips elevated No lifting over 15-20# No holding your breath with lifting items Keeping regular Drinking plenty of water Continue with abdominal massage Massage around the anus in sidely daily 2-3 minutes  Can put [facility]   [facility] [address] [place], [place] [address] Phone # [phone] Fax [phone]  Access Code: [email or web address]  URL: [email or web address]  Date: 01/31/2019  Prepared by: Earlie Counts   Exercises Seated Hamstring Stretch - 2 reps - 1 sets - 30 sec hold - 1x daily - 7x weekly Seated Piriformis Stretch with Trunk Bend - 2 reps - 1 sets - 30 sec hold - 1x daily - 7x weekly Seated Hip Flexor Stretch - 2 reps - 1 sets - 30 sec hold - 1x daily - 7x weekly Child's Pose Stretch - 2 reps - 1 sets - 30 sec hold - 1x daily - 7x weekly Childs Pose Side Bend - 2 reps - 1 sets - 30 sec hold - 1x daily - 7x weekly Supine Lower Trunk Rotation - 2 reps - 1 sets - 30 sec hold - 1x daily - 7x weekly Supine Butterfly Groin Stretch - 1 reps - 1 sets - 1 min hold - 1x daily - 7x weekly Supine Diaphragmatic Breathing - 10 reps - 1 sets - 1x daily - 7x weekly Supine Bridge - 10 reps - 1 sets - 1x daily - 7x weekly Hooklying Isometric Hip Flexion - 10 reps - 1 sets - 5 sec hold - 1x daily - 7x weekly Bent Knee Fallouts - 10 reps - 2 sets - 1x daily - 7x weekly Prone Alternating Arm and Leg Lifts - 10 reps - 2 sets - 1x daily - 7x weekly Patient Education Trigger Point Dry Needling

## 2019-01-31 NOTE — Therapy (Signed)
[facility] [address], [place], [place], [address] Phone: [phone]   Fax:  [phone]  Physical Therapy Treatment  Patient Details  Name: Ashley Marshall MRN: [id number] Date of Birth: 07-01-1970 Referring Provider (PT): Dr. Alta Corning   Encounter Date: 01/31/2019  PT End of Session - 01/31/19 0807    Visit Number  6    Date for PT Re-Evaluation  03/13/19    Authorization Type  UMR    PT Start Time  0805    PT Stop Time  0843    PT Time Calculation (min)  38 min    Activity Tolerance  Patient tolerated treatment well;No increased pain    Behavior During Therapy  WFL for tasks assessed/performed       Past Medical History:  Diagnosis Date  . Constipation   . GERD (gastroesophageal reflux disease)    no meds - diet controlled  . Herpes   . History of kidney stones    passed stones - no surgery required  . HPV in female   . Hyperlipidemia    diet controlled - no meds  . Missed ab    no surgery required  . Seasonal allergies   . SVD (spontaneous vaginal delivery)    x 2    Past Surgical History:  Procedure Laterality Date  . CATARACT EXTRACTION Bilateral   . COLONOSCOPY     x 2 -hx polyps  . COLPOSCOPY    . DILATATION & CURETTAGE/HYSTEROSCOPY WITH MYOSURE N/A 11/04/2016   Procedure: DILATATION & CURETTAGE/HYSTEROSCOPY WITH MYOSURE;  Surgeon: [name], MD;  Location: [facility];  Service: Gynecology;  Laterality: N/A;  . RETINAL DETACHMENT SURGERY Left 2000  . ROBOTIC ASSISTED LAPAROSCOPIC OVARIAN CYSTECTOMY Left 11/04/2016   Procedure: ROBOTIC ASSISTED LAPAROSCOPIC OVARIAN CYSTECTOMY;  Surgeon: [name], MD;  Location: [facility];  Service: Gynecology;  Laterality: Left;  RNFA confirmed on 10/26/16 with chass  . ROBOTIC ASSISTED SALPINGO OOPHERECTOMY Right 11/04/2016   Procedure: ROBOTIC ASSISTED SALPINGO OOPHORECTOMY;  Surgeon: [name], MD;  Location: [facility];   Service: Gynecology;  Laterality: Right;  . WISDOM TOOTH EXTRACTION      There were no vitals filed for this visit.  Subjective Assessment - 01/31/19 0807    Subjective  I am going to the bathroom daily without straining. I am taking my Miralx. Patient reports no bladder spasms in the past week and has not used the valium. I have had a few back spasms. The intensity of the back spasms is 50% better. I have had alot of bloating this week. I have done abdominal massage to help. I have stopped doing the squats due to pain.    Patient Stated Goals  exercise, how to manage prolapse, improve bowel movements, improve quality of life    Currently in Pain?  Yes    Pain Score  6     Pain Location  Back    Pain Orientation  Lower    Pain Descriptors / Indicators  Aching    Pain Type  Chronic pain    Pain Onset  More than a month ago    Pain Frequency  Intermittent    Aggravating Factors   leaning over, sitting    Pain Relieving Factors  hot shower, tylenol, Alleve    Multiple Pain Sites  Yes    Pain Score  7    Pain Location  Rectum    Pain Orientation  Mid  Pain Type  Chronic pain    Pain Onset  More than a month ago    Pain Frequency  Intermittent    Aggravating Factors   squats,    Pain Relieving Factors  lay on side         [facility] PT Assessment - 01/31/19 0001      Assessment   Medical Diagnosis  N81.9 Female genital prolapse, unspecified type    Referring Provider (PT)  Dr. Alta Corning    Onset Date/Surgical Date  --   2018   Prior Therapy  yes      Palpation   Palpation comment  tightness and tenderness in right quadratus                   [facility] Adult PT Treatment/Exercise - 01/31/19 0001      Self-Care   Self-Care  Other Self-Care Comments    Other Self-Care Comments   dicussed ways to manage prolpase, using recta care for pain from hemmorroids, the anatomy and ligaments of the pelvic floor and different prolapses      Lumbar Exercises: Aerobic    Stationary Bike  6 min level 3 while assessing patient      Lumbar Exercises: Supine   Clam  10 reps;1 second   each side   Clam Limitations  tactile cues to contract the lower abdominals    Isometric Hip Flexion  10 reps;5 seconds    Isometric Hip Flexion Limitations  VC to engage the pelvic floor      Manual Therapy   Manual Therapy  Soft tissue mobilization    Soft tissue mobilization  to bilateral coccygeus and levatror ani and right quadratus in sidely             PT Education - 01/31/19 0848    Education Details  Access Code: [email or web address]; ways to manage prolpase; recta care for hemmorroid pain    Person(s) Educated  Patient    Methods  Explanation;Demonstration;Handout    Comprehension  Verbalized understanding;Returned demonstration       PT Short Term Goals - 01/23/19 0824      PT SHORT TERM GOAL #1   Title  independent with initial HEP    Time  4    Period  Weeks    Status  Achieved    Target Date  01/16/19      PT SHORT TERM GOAL #2   Title  pain in right lower quadrant decreased >/= 25% with sitting and walking    Time  4    Period  Weeks    Status  Achieved    Target Date  01/16/19      PT SHORT TERM GOAL #3   Title  understand the correct toileting technique to empty the bladder and stool    Time  4    Period  Weeks    Status  Achieved    Target Date  01/16/19      PT SHORT TERM GOAL #4   Title  able to demonstrate correct posture in sitting and standing to reduce tightness in abdomen and pelvic floor    Time  4    Period  Weeks    Status  Achieved    Target Date  01/16/19        PT Long Term Goals - 01/31/19 1610      PT LONG TERM GOAL #1   Title  independent with HEP and understand how to progress  herself    Time  12    Period  Weeks    Status  On-going      PT LONG TERM GOAL #2   Title  able to urinate without bending over due to improved pelvic floor control    Time  12    Period  Weeks    Status  On-going      PT LONG TERM  GOAL #3   Title  pelvic floor strength is >/= 4/5 so if she has surgery she will have less complications    Time  12    Period  Weeks    Status  On-going      PT LONG TERM GOAL #4   Title  able to walk for 30 minutes with minimal pain due to improved strength and elongation of tissue    Time  12    Period  Weeks    Status  On-going      PT LONG TERM GOAL #5   Title  able to wait  2 hours to urinate due to decreased urge to urinate and increased strength of pelvic floor    Time  12    Period  Weeks    Status  On-going            Plan - 01/31/19 0865    Clinical Impression Statement  Patient is having a increased in rectal pain that could be from the prolapse or her hemmorroids. Patient reviewed her HEP to make sure she was engaging the pelvic floor and lower abdominals. Patient had spasms in the right quadratus and levator ani. Patient was instructed on how to massage around the recutm to relax the muscles. Patient has not met goals this time due to the increased pain and being discouraged with the pain. Patient has stopped the squats due to it may contribute to the pain. Patient has to continue to bend forward to urinate. Patient will benefit from skilled therapy to improve posture toreduce pain, improve muscle coordination to reduce constipation and difficulty with urination.    Personal Factors and Comorbidities  Comorbidity 1;Comorbidity 2    Comorbidities  endometriosis, genital  prolapse    Examination-Activity Limitations  Locomotion Level;Sit;Toileting    Stability/Clinical Decision Making  Evolving/Moderate complexity    Rehab Potential  Excellent    PT Frequency  1x / week    PT Duration  --   4 months   PT Treatment/Interventions  ADLs/Self Care Home Management;Biofeedback;Cryotherapy;Electrical Stimulation;Moist Heat;Ultrasound;Therapeutic exercise;Therapeutic activities;Neuromuscular re-education;Patient/family education;Dry needling;Manual techniques;Taping;Spinal  Manipulations    PT Next Visit Plan  core strength and back strength, assess pelvic floor strength, work on hip adductors and abductors in sidely with pelvic floor contraction; assess ilium    PT Home Exercise Plan  Access Code: [email or web address]    Consulted and Agree with Plan of Care  Patient       Patient will benefit from skilled therapeutic intervention in order to improve the following deficits and impairments:  Decreased coordination, Decreased range of motion, Increased fascial restricitons, Decreased endurance, Increased muscle spasms, Decreased activity tolerance, Pain, Impaired flexibility, Decreased mobility, Decreased strength, Postural dysfunction  Visit Diagnosis: Muscle weakness (generalized)  Cramp and spasm  Other lack of coordination  Female genital prolapse, unspecified type     Problem List There are no active problems to display for this patient.   Earlie Counts, PT 01/31/19 8:54 AM   [facility] [address], [place], [place],  [address] Phone: [phone]   Fax:  [phone]  Name: Ashley Marshall MRN: [id number] Date of Birth: 02-08-1971

## 2019-02-04 DIAGNOSIS — F411 Generalized anxiety disorder: Secondary | ICD-10-CM | POA: Diagnosis not present

## 2019-02-06 ENCOUNTER — Ambulatory Visit: Payer: 59 | Admitting: Physical Therapy

## 2019-02-06 ENCOUNTER — Other Ambulatory Visit: Payer: Self-pay

## 2019-02-06 ENCOUNTER — Encounter: Payer: Self-pay | Admitting: Physical Therapy

## 2019-02-06 DIAGNOSIS — M6281 Muscle weakness (generalized): Secondary | ICD-10-CM | POA: Diagnosis not present

## 2019-02-06 DIAGNOSIS — R278 Other lack of coordination: Secondary | ICD-10-CM | POA: Diagnosis not present

## 2019-02-06 DIAGNOSIS — N819 Female genital prolapse, unspecified: Secondary | ICD-10-CM | POA: Diagnosis not present

## 2019-02-06 DIAGNOSIS — R252 Cramp and spasm: Secondary | ICD-10-CM | POA: Diagnosis not present

## 2019-02-06 NOTE — Therapy (Signed)
[facility] [address], [place], [place], [address] Phone: [phone]   Fax:  [phone]  Physical Therapy Treatment  Patient Details  Name: Ashley Marshall MRN: [id number] Date of Birth: 22-Apr-1971 Referring Provider (PT): Dr. Alta Corning   Encounter Date: 02/06/2019  PT End of Session - 02/06/19 0855    Visit Number  7    Date for PT Re-Evaluation  03/13/19    Authorization Type  UMR    PT Start Time  0815    PT Stop Time  3361    PT Time Calculation (min)  40 min    Activity Tolerance  Patient tolerated treatment well;No increased pain    Behavior During Therapy  WFL for tasks assessed/performed       Past Medical History:  Diagnosis Date  . Constipation   . GERD (gastroesophageal reflux disease)    no meds - diet controlled  . Herpes   . History of kidney stones    passed stones - no surgery required  . HPV in female   . Hyperlipidemia    diet controlled - no meds  . Missed ab    no surgery required  . Seasonal allergies   . SVD (spontaneous vaginal delivery)    x 2    Past Surgical History:  Procedure Laterality Date  . CATARACT EXTRACTION Bilateral   . COLONOSCOPY     x 2 -hx polyps  . COLPOSCOPY    . DILATATION & CURETTAGE/HYSTEROSCOPY WITH MYOSURE N/A 11/04/2016   Procedure: DILATATION & CURETTAGE/HYSTEROSCOPY WITH MYOSURE;  Surgeon: [name], MD;  Location: [facility];  Service: Gynecology;  Laterality: N/A;  . RETINAL DETACHMENT SURGERY Left 2000  . ROBOTIC ASSISTED LAPAROSCOPIC OVARIAN CYSTECTOMY Left 11/04/2016   Procedure: ROBOTIC ASSISTED LAPAROSCOPIC OVARIAN CYSTECTOMY;  Surgeon: [name], MD;  Location: [facility];  Service: Gynecology;  Laterality: Left;  RNFA confirmed on 10/26/16 with chass  . ROBOTIC ASSISTED SALPINGO OOPHERECTOMY Right 11/04/2016   Procedure: ROBOTIC ASSISTED SALPINGO OOPHORECTOMY;  Surgeon: [name], MD;  Location: [facility];   Service: Gynecology;  Laterality: Right;  . WISDOM TOOTH EXTRACTION      There were no vitals filed for this visit.  Subjective Assessment - 02/06/19 0822    Subjective  No difficulty with the prolpase this week. I am having bowel movements. Over the weekend I had a right sided back pain I am not sure why I have this pain. I have had this pain since April. My rectal pain is gone.    Patient Stated Goals  exercise, how to manage prolapse, improve bowel movements, improve quality of life    Currently in Pain?  Yes    Pain Score  6    high is 8/10   Pain Location  Back    Pain Orientation  Right;Lower    Pain Descriptors / Indicators  Discomfort;Spasm;Tightness   shock   Pain Type  Chronic pain    Pain Onset  More than a month ago    Pain Frequency  Intermittent    Aggravating Factors   Not sure, could be sitting or standing    Pain Relieving Factors  hot shower, tylenol, Alleve    Multiple Pain Sites  No         [facility] PT Assessment - 02/06/19 0001      Assessment   Medical Diagnosis  N81.9 Female genital prolapse, unspecified type    Referring Provider (PT)  Dr. Barnetta Chapel  Kelle Darting    Onset Date/Surgical Date  --   2018   Prior Therapy  yes      Precautions   Precautions  None      Restrictions   Weight Bearing Restrictions  No      AROM   Lumbar Extension  decreased by 25%    Lumbar - Right Side Bend  full    Lumbar - Left Side Bend  decreased 25%   tightness on the right   Lumbar - Right Rotation  full    Lumbar - Left Rotation  decreased by 25%   tugging  discomfort     Palpation   Palpation comment  tightness and tenderness in right quadratus                Pelvic Floor Special Questions - 02/06/19 0001    Pelvic Floor Internal Exam  Patient confirms identification and approves PT to assess and treat the pelvic floor        [facility] Adult PT Treatment/Exercise - 02/06/19 0001      Lumbar Exercises: Aerobic   Stationary Bike  6 min level 3  while assessing patient      Lumbar Exercises: Sidelying   Hip Abduction  Right;Left;15 reps;1 second   tactile cues for correct alignment   Other Sidelying Lumbar Exercises  hands in front then rotate trunk 15x each side      Lumbar Exercises: Quadruped   Opposite Arm/Leg Raise  10 reps;Right arm/Left leg;Left arm/Right leg    Opposite Arm/Leg Raise Limitations  VC and tactile cues to tighten the abdominals and pelvic floor      Manual Therapy   Manual Therapy  Joint mobilization;Soft tissue mobilization;Internal Pelvic Floor    Joint Mobilization  gapping of the right thoracic lumbar junction    Soft tissue mobilization  right quadratus, thoracic lumbar paraspinals, and right intercoastals               PT Short Term Goals - 02/06/19 0857      PT SHORT TERM GOAL #1   Title  independent with initial HEP    Time  4    Period  Weeks    Status  Achieved    Target Date  01/16/19      PT SHORT TERM GOAL #2   Title  pain in right lower quadrant decreased >/= 25% with sitting and walking    Time  4    Period  Weeks    Status  Achieved    Target Date  01/16/19      PT SHORT TERM GOAL #3   Title  understand the correct toileting technique to empty the bladder and stool    Time  4    Period  Weeks    Status  Achieved    Target Date  01/16/19      PT SHORT TERM GOAL #4   Title  able to demonstrate correct posture in sitting and standing to reduce tightness in abdomen and pelvic floor    Time  4    Period  Weeks    Status  Achieved    Target Date  01/16/19        PT Long Term Goals - 02/06/19 3299      PT LONG TERM GOAL #1   Title  independent with HEP and understand how to progress herself    Time  12    Period  Weeks    Status  On-going      PT LONG TERM GOAL #2   Title  able to urinate without bending over due to improved pelvic floor control    Time  12    Period  Weeks    Status  Not Met      PT LONG TERM GOAL #3   Title  pelvic floor strength is  >/= 4/5 so if she has surgery she will have less complications    Time  12    Period  Weeks    Status  On-going      PT LONG TERM GOAL #4   Title  able to walk for 30 minutes with minimal pain due to improved strength and elongation of tissue    Time  12    Period  Weeks    Status  Achieved      PT LONG TERM GOAL #5   Title  able to wait  2 hours to urinate due to decreased urge to urinate and increased strength of pelvic floor    Time  12    Period  Weeks    Status  Achieved            Plan - 02/06/19 1660    Clinical Impression Statement  Patient is unsteady with lifting opposite extremity in quadruped due to needing more trunk strength. Patient has not felt the prolapse and rectal pain this week. After manual work the pain decreased to 3/10. Patient has tightness in the right lumbar thoracic junction and lower right rib cage. Patient will benefit from skilled therapy to improve posture to reduce pain, improve muscle coordination to reduce constipation and difficulty with urination.    Personal Factors and Comorbidities  Comorbidity 1;Comorbidity 2    Comorbidities  endometriosis, genital  prolapse    Examination-Activity Limitations  Locomotion Level;Sit;Toileting    Stability/Clinical Decision Making  Evolving/Moderate complexity    Rehab Potential  Excellent    PT Frequency  1x / week    PT Duration  --   4 months   PT Treatment/Interventions  ADLs/Self Care Home Management;Biofeedback;Cryotherapy;Electrical Stimulation;Moist Heat;Ultrasound;Therapeutic exercise;Therapeutic activities;Neuromuscular re-education;Patient/family education;Dry needling;Manual techniques;Taping;Spinal Manipulations    PT Next Visit Plan  internal soft tissue work to pelvic floor; add to HEP for quadruped lift extremtity and sidely hip abduction. after next visit place patient on hold till she sees her doctor    PT Home Exercise Plan  Access Code: [email or web address]    Consulted and Agree with Plan of Care   Patient       Patient will benefit from skilled therapeutic intervention in order to improve the following deficits and impairments:  Decreased coordination, Decreased range of motion, Increased fascial restricitons, Decreased endurance, Increased muscle spasms, Decreased activity tolerance, Pain, Impaired flexibility, Decreased mobility, Decreased strength, Postural dysfunction  Visit Diagnosis: Muscle weakness (generalized)  Cramp and spasm  Other lack of coordination  Female genital prolapse, unspecified type     Problem List There are no active problems to display for this patient.   Earlie Counts, PT 02/06/19 11:34 AM   [facility] [address], [place], [place], [address] Phone: [phone]   Fax:  [phone]  Name: MAKENZIE WEISNER MRN: [id number] Date of Birth: [birth year]

## 2019-02-14 ENCOUNTER — Encounter: Payer: Self-pay | Admitting: Physical Therapy

## 2019-02-14 ENCOUNTER — Ambulatory Visit: Payer: 59 | Attending: Obstetrics and Gynecology | Admitting: Physical Therapy

## 2019-02-14 ENCOUNTER — Other Ambulatory Visit: Payer: Self-pay

## 2019-02-14 DIAGNOSIS — R252 Cramp and spasm: Secondary | ICD-10-CM | POA: Diagnosis not present

## 2019-02-14 DIAGNOSIS — R278 Other lack of coordination: Secondary | ICD-10-CM | POA: Diagnosis not present

## 2019-02-14 DIAGNOSIS — M6281 Muscle weakness (generalized): Secondary | ICD-10-CM | POA: Insufficient documentation

## 2019-02-14 DIAGNOSIS — N819 Female genital prolapse, unspecified: Secondary | ICD-10-CM | POA: Diagnosis not present

## 2019-02-14 NOTE — Patient Instructions (Addendum)
Slow Contraction: Gravity Eliminated (Hook-Lying)    Lie with hips and knees bent. Slowly squeeze pelvic floor for _5__ seconds. Rest for _5__ seconds. Repeat _10__ times. Do __3_ times a day.   Copyright  VHI. All rights reserved.  Quick Contraction: Gravity Eliminated (Hook-Lying)    Lie with hips and knees bent. Quickly squeeze then fully relax pelvic floor. Perform _1__ sets of _5__. Rest for _1__ seconds between sets. Do _3__ times a day.   Copyright  VHI. All rights reserved.  [place] [facility] [address], [place], [place] [address] Phone # [phone] Fax [phone]

## 2019-02-14 NOTE — Therapy (Addendum)
[facility] [address] [place], [place], [address] Phone: [phone]   Fax:  [phone]  Physical Therapy Treatment  Patient Details  Name: Ashley Marshall MRN: [id number] Date of Birth: [birth year] Referring Provider (PT): Dr. Alta Corning   Encounter Date: 02/14/2019  PT End of Session - 02/14/19 0813    Visit Number  8    Date for PT Re-Evaluation  03/13/19    Authorization Type  [facility]    PT Start Time  0800    PT Stop Time  0840    PT Time Calculation (min)  40 min    Activity Tolerance  Patient tolerated treatment well;No increased pain    Behavior During Therapy  WFL for tasks assessed/performed       Past Medical History:  Diagnosis Date  . Constipation   . GERD (gastroesophageal reflux disease)    no meds - diet controlled  . Herpes   . History of kidney stones    passed stones - no surgery required  . HPV in female   . Hyperlipidemia    diet controlled - no meds  . Missed ab    no surgery required  . Seasonal allergies   . SVD (spontaneous vaginal delivery)    x 2    Past Surgical History:  Procedure Laterality Date  . CATARACT EXTRACTION Bilateral   . COLONOSCOPY     x 2 -hx polyps  . COLPOSCOPY    . DILATATION & CURETTAGE/HYSTEROSCOPY WITH MYOSURE N/A 11/04/2016   Procedure: DILATATION & CURETTAGE/HYSTEROSCOPY WITH MYOSURE;  Surgeon: [name], MD;  Location: [facility];  Service: Gynecology;  Laterality: N/A;  . RETINAL DETACHMENT SURGERY Left 2000  . ROBOTIC ASSISTED LAPAROSCOPIC OVARIAN CYSTECTOMY Left 11/04/2016   Procedure: ROBOTIC ASSISTED LAPAROSCOPIC OVARIAN CYSTECTOMY;  Surgeon: [name], MD;  Location: [facility];  Service: Gynecology;  Laterality: Left;  RNFA confirmed on 10/26/16 with chass  . ROBOTIC ASSISTED SALPINGO OOPHERECTOMY Right 11/04/2016   Procedure: ROBOTIC ASSISTED SALPINGO OOPHORECTOMY;  Surgeon: [name], MD;  Location: [facility];   Service: Gynecology;  Laterality: Right;  . WISDOM TOOTH EXTRACTION      There were no vitals filed for this visit.  Subjective Assessment - 02/14/19 0807    Subjective  I had a couple of episodes this week. I have a band of pain in lumbar. gynecologist on 10/6 and urogynecologist end of October. I am moving more for the past week. I have had more energy. I had some urine urgency and used a valium suppositories and it helped.    Patient Stated Goals  exercise, how to manage prolapse, improve bowel movements, improve quality of life    Currently in Pain?  Yes    Pain Score  6     Pain Location  Back    Pain Orientation  Right;Lower    Pain Descriptors / Indicators  Discomfort;Spasm;Tightness    Pain Type  Chronic pain    Pain Onset  More than a month ago    Pain Frequency  Intermittent    Aggravating Factors   not sure, could be sitting or standing    Multiple Pain Sites  No                    Pelvic Floor Special Questions - 02/14/19 0001    Pelvic Floor Internal Exam  Patient confirms identification and approves PT to assess and treat the pelvic floor  Palpation  needed tactile cues to isolate pelvic floor contraction    Strength  weak squeeze, no lift        [facility] Adult PT Treatment/Exercise - 02/14/19 0001      Neuro Re-ed    Neuro Re-ed Details   pelvic floor contraction in supine with 2 fingers in the vaginal canal in supine      Lumbar Exercises: Aerobic   Stationary Bike  6 min level 3 while assessing patient      Manual Therapy   Manual Therapy  Internal Pelvic Floor    Internal Pelvic Floor  release of the urethra and urethra sphincter, along the right ATLA, sides of introitus, bil. ischiococcygeus             PT Education - 02/14/19 0841    Education Details  pelvic floor contraction in supine    Person(s) Educated  Patient    Methods  Explanation;Demonstration;Verbal cues;Handout    Comprehension  Verbalized understanding;Returned  demonstration       PT Short Term Goals - 02/06/19 0857      PT SHORT TERM GOAL #1   Title  independent with initial HEP    Time  4    Period  Weeks    Status  Achieved    Target Date  01/16/19      PT SHORT TERM GOAL #2   Title  pain in right lower quadrant decreased >/= 25% with sitting and walking    Time  4    Period  Weeks    Status  Achieved    Target Date  01/16/19      PT SHORT TERM GOAL #3   Title  understand the correct toileting technique to empty the bladder and stool    Time  4    Period  Weeks    Status  Achieved    Target Date  01/16/19      PT SHORT TERM GOAL #4   Title  able to demonstrate correct posture in sitting and standing to reduce tightness in abdomen and pelvic floor    Time  4    Period  Weeks    Status  Achieved    Target Date  01/16/19        PT Long Term Goals - 02/06/19 8676      PT LONG TERM GOAL #1   Title  independent with HEP and understand how to progress herself    Time  12    Period  Weeks    Status  On-going      PT LONG TERM GOAL #2   Title  able to urinate without bending over due to improved pelvic floor control    Time  12    Period  Weeks    Status  Not Met      PT LONG TERM GOAL #3   Title  pelvic floor strength is >/= 4/5 so if she has surgery she will have less complications    Time  12    Period  Weeks    Status  On-going      PT LONG TERM GOAL #4   Title  able to walk for 30 minutes with minimal pain due to improved strength and elongation of tissue    Time  12    Period  Weeks    Status  Achieved      PT LONG TERM GOAL #5   Title  able to wait  2  hours to urinate due to decreased urge to urinate and increased strength of pelvic floor    Time  12    Period  Weeks    Status  Achieved            Plan - 02/14/19 0813    Clinical Impression Statement  Pelvic floor strength is 2/5 needing verbal cues to not contract the gluteals. Patient still has lumbar pain. Patient feels the prolapse when she  squats at work. Patient still has to bend at times to urinate. Patient will be seeing her gynecologist on 10/6/202 and her urogynecologist at the end of the month to dtermine if she will continue PT. Patient will need further pelvic floor strength if she returns.    Personal Factors and Comorbidities  Comorbidity 1;Comorbidity 2    Comorbidities  endometriosis, genital  prolapse    Examination-Activity Limitations  Locomotion Level;Sit;Toileting    Stability/Clinical Decision Making  Evolving/Moderate complexity    Rehab Potential  Excellent    PT Frequency  1x / week    PT Treatment/Interventions  ADLs/Self Care Home Management;Biofeedback;Cryotherapy;Electrical Stimulation;Moist Heat;Ultrasound;Therapeutic exercise;Therapeutic activities;Neuromuscular re-education;Patient/family education;Dry needling;Manual techniques;Taping;Spinal Manipulations    PT Next Visit Plan  See what MD says, work on pelvic floor strength    PT Home Exercise Plan  Access Code: [email or web address]    Consulted and Agree with Plan of Care  Patient       Patient will benefit from skilled therapeutic intervention in order to improve the following deficits and impairments:  Decreased coordination, Decreased range of motion, Increased fascial restricitons, Decreased endurance, Increased muscle spasms, Decreased activity tolerance, Pain, Impaired flexibility, Decreased mobility, Decreased strength, Postural dysfunction  Visit Diagnosis: Muscle weakness (generalized)  Cramp and spasm  Other lack of coordination  Female genital prolapse, unspecified type     Problem List There are no active problems to display for this patient.   Earlie Counts, PT 02/14/19 8:54 AM   [facility] [address] [place], [place], [address] Phone: [phone]   Fax:  [phone]  Name: Ashley Marshall MRN: [id number] Date of Birth: Dec 10, 1970  PHYSICAL THERAPY DISCHARGE  SUMMARY  Visits from Start of Care: 8  Current functional level related to goals / functional outcomes: See above.    Remaining deficits: See above. Unable to fully assess patient due to her not returning.    Education / Equipment: HEP Plan:                                                    Patient goals were not met. Patient is being discharged due to not returning since the last visit.  Thank you for the referral. Earlie Counts, PT 03/20/19 11:13 AM  ?????

## 2019-02-18 ENCOUNTER — Other Ambulatory Visit: Payer: Self-pay

## 2019-02-19 ENCOUNTER — Ambulatory Visit: Payer: 59 | Admitting: Obstetrics & Gynecology

## 2019-02-19 ENCOUNTER — Telehealth: Payer: Self-pay | Admitting: *Deleted

## 2019-02-19 DIAGNOSIS — F411 Generalized anxiety disorder: Secondary | ICD-10-CM | POA: Diagnosis not present

## 2019-02-19 NOTE — Telephone Encounter (Signed)
Patient called to cancel appointment today due to severe headache and nausea, and not feeling well. Patient c/o urinary symptoms since last week, frequent urination, mild burning. Not sure if related to IC, had specimen done at [facility] 1 months ago due to same symptoms and u/a was negative. I explained I will check with appointment desk to see if today visit can be switched to virtual visit. Message sent to appointment desk.

## 2019-02-26 ENCOUNTER — Other Ambulatory Visit: Payer: Self-pay | Admitting: Obstetrics & Gynecology

## 2019-02-26 DIAGNOSIS — Z1231 Encounter for screening mammogram for malignant neoplasm of breast: Secondary | ICD-10-CM

## 2019-03-01 ENCOUNTER — Telehealth: Payer: Self-pay

## 2019-03-01 DIAGNOSIS — Z23 Encounter for immunization: Secondary | ICD-10-CM | POA: Diagnosis not present

## 2019-03-01 DIAGNOSIS — E559 Vitamin D deficiency, unspecified: Secondary | ICD-10-CM | POA: Diagnosis not present

## 2019-03-01 DIAGNOSIS — Z0000 Encounter for general adult medical examination without abnormal findings: Secondary | ICD-10-CM | POA: Diagnosis not present

## 2019-03-01 DIAGNOSIS — E7800 Pure hypercholesterolemia, unspecified: Secondary | ICD-10-CM | POA: Diagnosis not present

## 2019-03-01 NOTE — Telephone Encounter (Addendum)
Patient was scheduled to see you on Monday 19th Oct at 9:00am for a 2nd opinion. She sees surgery on 28th.  The appt desk offered her appt on the 20th and on the 21st but she said she is a [employer] and has appointments that day/ meeting on 21st at time offered.   She asked if any way possible you would be willing to work her in on the 23rd Oct as she is available that day.

## 2019-03-01 NOTE — Telephone Encounter (Signed)
Let's do 12:30 on 10/19th.  23rd is too full already.Marland KitchenMarland Kitchen

## 2019-03-04 ENCOUNTER — Ambulatory Visit: Payer: 59 | Admitting: Obstetrics & Gynecology

## 2019-03-04 ENCOUNTER — Encounter: Payer: Self-pay | Admitting: Obstetrics & Gynecology

## 2019-03-04 ENCOUNTER — Other Ambulatory Visit: Payer: Self-pay

## 2019-03-04 VITALS — BP 134/76

## 2019-03-04 DIAGNOSIS — R350 Frequency of micturition: Secondary | ICD-10-CM | POA: Diagnosis not present

## 2019-03-04 DIAGNOSIS — K6289 Other specified diseases of anus and rectum: Secondary | ICD-10-CM | POA: Diagnosis not present

## 2019-03-04 NOTE — Patient Instructions (Signed)
1. Frequent urination Frequent urination and dysuria.  Mildly abnormal urine analysis.  Will wait on urine culture to decide on treatment.  Recommend increasing hydration with water.  Minimal cystorectocele.  No significant uterine prolapse.  Do not recommend surgical correction at this time.  Recommend avoiding excessive pelvic pressure and overfilling of the bladder. - Urinalysis,Complete w/RFL Culture  2. Rectal or anal pain Constipation with difficulty passing stools at times.  Rectal and anal pain.  No evidence of significant rectal prolapse on exam.  Decision to refer patient to Leighton Ruff, Colon/Rectal surgeon for her opinion on diagnosis and management.  Other orders - Urine Culture - REFLEXIVE URINE CULTURE  Jacole, it was a pleasure seeing you today!  I will inform you of your results as soon as they are available.

## 2019-03-04 NOTE — Progress Notes (Signed)
    Ashley Marshall 1970-11-10 [id number]        48 y.o.  [id number] Married.  Nurse.  RP: Counseling on bladder and rectal problems  HPI: Urinary frequency and urgency x many months without cystitis.  Minimal urinary incontinence.  No blood in urine.  Tendency towards constipation with difficulty passing stools at times and rectal pains.  No rectal bleeding.  Seen by Dr Maryland Pink Uro-gynecologist who scheduled an Anterior/Posterior repair and repair of rectal prolapse.  Patient has postponed surgery because she is not completely convinced it is the solution.  Physical Therapy has caused more pain than improvement.  Patient wants a second opinion before proceeding with surgery.   OB History  Gravida Para Term Preterm AB Living  3 2     1 2   SAB TAB Ectopic Multiple Live Births  1            # Outcome Date GA Lbr Len/2nd Weight Sex Delivery Anes PTL Lv  3 SAB           2 Para           1 Para             Past medical history,surgical history, problem list, medications, allergies, family history and social history were all reviewed and documented in the EPIC chart.   Directed ROS with pertinent positives and negatives documented in the history of present illness/assessment and plan.  Exam:  Vitals:   03/04/19 1233  BP: 134/76   General appearance:  Normal  Abdomen: Normal  Gynecologic exam: Vulva normal.  Supine with valsalva:  No significant uterine prolapse.  Cystocele grade 1/4.  Rectocele grade 1/3.  No visible rectal prolapse.  Standing with valsalva:  Maximum uterine prolapse 1/3, cystocele 1-2/4, rectocele 1/3.  No rectal prolapse visible or felt with rectal exam.  U/A: Yellow clear, protein negative, nitrites negative, white blood cells 6-10, red blood cells negative, few bacteria.  Urine culture pending.   Assessment/Plan:  48 y.o. [id number]   1. Frequent urination Frequent urination and dysuria.  Mildly abnormal urine analysis.  Will wait on urine culture to  decide on treatment.  Recommend increasing hydration with water.  Minimal cystorectocele.  No significant uterine prolapse.  Do not recommend surgical correction at this time.  Recommend avoiding excessive pelvic pressure and overfilling of the bladder. - Urinalysis,Complete w/RFL Culture  2. Rectal or anal pain Constipation with difficulty passing stools at times.  Rectal and anal pain.  No evidence of significant rectal prolapse on exam.  Decision to refer patient to Leighton Ruff, Colon/Rectal surgeon for her opinion on diagnosis and management.  Other orders - Urine Culture - REFLEXIVE URINE CULTURE  Counseling on above issues and coordination of care more than 50% for 25 minutes.  [name] MD, 5:29 PM 03/04/2019

## 2019-03-04 NOTE — Telephone Encounter (Signed)
Claudia contacted patient and arranged appt today.

## 2019-03-05 ENCOUNTER — Telehealth: Payer: Self-pay | Admitting: *Deleted

## 2019-03-05 NOTE — Telephone Encounter (Signed)
Refer to Ashley Marshall at [facility] surgery " Problems with stools/rectal pain. Had surgery planned with a Uro-Gynecologist for Anterior/Posterior repair and repair of rectal prolapse. No significant Cysto-rectocele and no Rectal prolapse on exam today. Opinion on management.    Referral placed at [facility] they will call to schedule.

## 2019-03-06 LAB — URINALYSIS, COMPLETE W/RFL CULTURE
Bilirubin Urine: NEGATIVE
Glucose, UA: NEGATIVE
Hgb urine dipstick: NEGATIVE
Hyaline Cast: NONE SEEN /LPF
Ketones, ur: NEGATIVE
Nitrites, Initial: NEGATIVE
Protein, ur: NEGATIVE
RBC / HPF: NONE SEEN /HPF (ref 0–2)
Specific Gravity, Urine: 1.02 (ref 1.001–1.03)
pH: 5.5 (ref 5.0–8.0)

## 2019-03-06 LAB — URINE CULTURE
MICRO NUMBER:: 1003809
SPECIMEN QUALITY:: ADEQUATE

## 2019-03-06 LAB — CULTURE INDICATED

## 2019-03-07 NOTE — Telephone Encounter (Signed)
Patient scheduled on 03/11/19 with Dr. Marcello Moores

## 2019-03-08 ENCOUNTER — Other Ambulatory Visit: Payer: Self-pay | Admitting: *Deleted

## 2019-03-08 MED ORDER — AMOXICILLIN 500 MG PO CAPS: 500.0000 mg | ORAL_CAPSULE | Freq: Three times a day (TID) | ORAL | 0 refills | Status: DC

## 2019-03-08 MED FILL — AMOXICILLIN 500 MG CAPSULE: 500 | 7 days supply | Qty: 21 | Fill #0

## 2019-03-08 NOTE — Progress Notes (Signed)
Per urine culture results and result note by ML KW CMA

## 2019-03-13 DIAGNOSIS — N8111 Cystocele, midline: Secondary | ICD-10-CM | POA: Diagnosis not present

## 2019-03-13 DIAGNOSIS — N816 Rectocele: Secondary | ICD-10-CM | POA: Diagnosis not present

## 2019-03-13 DIAGNOSIS — N9489 Other specified conditions associated with female genital organs and menstrual cycle: Secondary | ICD-10-CM | POA: Diagnosis not present

## 2019-03-13 DIAGNOSIS — R350 Frequency of micturition: Secondary | ICD-10-CM | POA: Diagnosis not present

## 2019-03-28 ENCOUNTER — Other Ambulatory Visit: Payer: Self-pay

## 2019-03-29 ENCOUNTER — Ambulatory Visit: Payer: 59 | Admitting: Obstetrics & Gynecology

## 2019-04-18 ENCOUNTER — Other Ambulatory Visit: Payer: Self-pay

## 2019-04-18 ENCOUNTER — Ambulatory Visit
Admission: RE | Admit: 2019-04-18 | Discharge: 2019-04-18 | Disposition: A | Discharge status: Home / Self Care | Payer: 59 | Source: Ambulatory Visit | Attending: Obstetrics & Gynecology | Admitting: Obstetrics & Gynecology

## 2019-04-18 DIAGNOSIS — Z1231 Encounter for screening mammogram for malignant neoplasm of breast: Secondary | ICD-10-CM | POA: Diagnosis not present

## 2019-04-18 MED FILL — RESTASIS 0.05% EYE EMULSION: 0.05 | 90 days supply | Qty: 180 | Fill #1

## 2019-05-16 DIAGNOSIS — F411 Generalized anxiety disorder: Secondary | ICD-10-CM | POA: Diagnosis not present

## 2019-05-20 MED FILL — traZODone HCL 50 MG TABS: 50 | 90 days supply | Qty: 45 | Fill #0

## 2019-05-31 DIAGNOSIS — H1031 Unspecified acute conjunctivitis, right eye: Secondary | ICD-10-CM | POA: Diagnosis not present

## 2019-05-31 MED FILL — POLYMYXIN B/TMP EYE DROPS: 10000-0.1 | 7 days supply | Qty: 10 | Fill #0

## 2019-06-05 DIAGNOSIS — F411 Generalized anxiety disorder: Secondary | ICD-10-CM | POA: Diagnosis not present

## 2019-06-07 DIAGNOSIS — J0100 Acute maxillary sinusitis, unspecified: Secondary | ICD-10-CM | POA: Diagnosis not present

## 2019-06-07 MED FILL — AMOX-CLAV 875-125 MG TABLET: 875-125 | 5 days supply | Qty: 10 | Fill #0

## 2019-06-19 DIAGNOSIS — J343 Hypertrophy of nasal turbinates: Secondary | ICD-10-CM | POA: Diagnosis not present

## 2019-06-19 DIAGNOSIS — J342 Deviated nasal septum: Secondary | ICD-10-CM | POA: Diagnosis not present

## 2019-06-19 DIAGNOSIS — J310 Chronic rhinitis: Secondary | ICD-10-CM | POA: Diagnosis not present

## 2019-06-19 MED FILL — AMOX-CLAV 875-125 MG TABLET: 875-125 | 5 days supply | Qty: 10 | Fill #0

## 2019-06-19 MED FILL — predniSONE 10 MG (21) TBPK: 10 | 6 days supply | Qty: 21 | Fill #0

## 2019-06-26 NOTE — Telephone Encounter (Signed)
Error

## 2019-07-10 DIAGNOSIS — J343 Hypertrophy of nasal turbinates: Secondary | ICD-10-CM | POA: Diagnosis not present

## 2019-07-10 DIAGNOSIS — J342 Deviated nasal septum: Secondary | ICD-10-CM | POA: Diagnosis not present

## 2019-07-10 DIAGNOSIS — J310 Chronic rhinitis: Secondary | ICD-10-CM | POA: Diagnosis not present

## 2019-07-11 DIAGNOSIS — Z8371 Family history of colonic polyps: Secondary | ICD-10-CM | POA: Diagnosis not present

## 2019-07-11 DIAGNOSIS — K2100 Gastro-esophageal reflux disease with esophagitis, without bleeding: Secondary | ICD-10-CM | POA: Diagnosis not present

## 2019-07-25 DIAGNOSIS — M5030 Other cervical disc degeneration, unspecified cervical region: Secondary | ICD-10-CM | POA: Diagnosis not present

## 2019-07-25 DIAGNOSIS — M2550 Pain in unspecified joint: Secondary | ICD-10-CM | POA: Diagnosis not present

## 2019-07-25 DIAGNOSIS — M79643 Pain in unspecified hand: Secondary | ICD-10-CM | POA: Diagnosis not present

## 2019-07-25 DIAGNOSIS — M542 Cervicalgia: Secondary | ICD-10-CM | POA: Diagnosis not present

## 2019-07-25 DIAGNOSIS — M797 Fibromyalgia: Secondary | ICD-10-CM | POA: Diagnosis not present

## 2019-07-25 DIAGNOSIS — M6530 Trigger finger, unspecified finger: Secondary | ICD-10-CM | POA: Diagnosis not present

## 2019-07-25 DIAGNOSIS — H04123 Dry eye syndrome of bilateral lacrimal glands: Secondary | ICD-10-CM | POA: Diagnosis not present

## 2019-07-25 DIAGNOSIS — E119 Type 2 diabetes mellitus without complications: Secondary | ICD-10-CM | POA: Diagnosis not present

## 2019-07-25 DIAGNOSIS — F411 Generalized anxiety disorder: Secondary | ICD-10-CM | POA: Diagnosis not present

## 2019-07-25 DIAGNOSIS — R768 Other specified abnormal immunological findings in serum: Secondary | ICD-10-CM | POA: Diagnosis not present

## 2019-07-26 MED FILL — RESTASIS 0.05% EYE EMULSION: 0.05 | 90 days supply | Qty: 180 | Fill #2

## 2019-07-29 DIAGNOSIS — D8989 Other specified disorders involving the immune mechanism, not elsewhere classified: Secondary | ICD-10-CM | POA: Diagnosis not present

## 2019-07-29 DIAGNOSIS — R768 Other specified abnormal immunological findings in serum: Secondary | ICD-10-CM | POA: Diagnosis not present

## 2019-07-29 DIAGNOSIS — E119 Type 2 diabetes mellitus without complications: Secondary | ICD-10-CM | POA: Diagnosis not present

## 2019-09-27 ENCOUNTER — Encounter: Payer: 59 | Admitting: Obstetrics & Gynecology

## 2019-09-30 ENCOUNTER — Other Ambulatory Visit: Payer: Self-pay

## 2019-10-01 ENCOUNTER — Encounter: Payer: Self-pay | Admitting: Obstetrics & Gynecology

## 2019-10-01 ENCOUNTER — Ambulatory Visit (INDEPENDENT_AMBULATORY_CARE_PROVIDER_SITE_OTHER): Payer: 59 | Admitting: Obstetrics & Gynecology

## 2019-10-01 VITALS — BP 110/70 | Ht 62.0 in | Wt 143.0 lb

## 2019-10-01 DIAGNOSIS — Z01419 Encounter for gynecological examination (general) (routine) without abnormal findings: Secondary | ICD-10-CM

## 2019-10-01 DIAGNOSIS — Z780 Asymptomatic menopausal state: Secondary | ICD-10-CM | POA: Diagnosis not present

## 2019-10-01 NOTE — Progress Notes (Signed)
Ashley Marshall 25-May-1970 [id number]   History:    49 y.o. G3P2A1L2 Married.  Vasectomy.  Works at [employer] center.  Daughter 47, son 67.  RP:  Established patient presenting for annual gyn exam   HPI: Menopause x 2 years, well on no HRT.  FSH 92.9 in 02/2018.  No PMB.  No pelvic pain.  Rarely sexually active, no pain with IC.  Breasts normal.  BMI 26.16.  Walking for fitness.  Seen by Dr Rodman Key Urogynecology: Pelvic floor dysfunction doing PT and vaginal Valium, possible mild IC.  Cysto-rectocele with mild stable symptoms, prefers observation.  Health labs with Fam MD.  Harriet Masson every 3 yrs, polyps.   Past medical history,surgical history, family history and social history were all reviewed and documented in the EPIC chart.  Gynecologic History Patient's last menstrual period was 09/02/2018.  Obstetric History OB History  Gravida Para Term Preterm AB Living  3 2     1 2   SAB TAB Ectopic Multiple Live Births  1            # Outcome Date GA Lbr Len/2nd Weight Sex Delivery Anes PTL Lv  3 SAB           2 Para           1 Para              ROS: A ROS was performed and pertinent positives and negatives are included in the history.  GENERAL: No fevers or chills. HEENT: No change in vision, no earache, sore throat or sinus congestion. NECK: No pain or stiffness. CARDIOVASCULAR: No chest pain or pressure. No palpitations. PULMONARY: No shortness of breath, cough or wheeze. GASTROINTESTINAL: No abdominal pain, nausea, vomiting or diarrhea, melena or bright red blood per rectum. GENITOURINARY: No urinary frequency, urgency, hesitancy or dysuria. MUSCULOSKELETAL: No joint or muscle pain, no back pain, no recent trauma. DERMATOLOGIC: No rash, no itching, no lesions. ENDOCRINE: No polyuria, polydipsia, no heat or cold intolerance. No recent change in weight. HEMATOLOGICAL: No anemia or easy bruising or bleeding. NEUROLOGIC: No headache, seizures, numbness, tingling or weakness.  PSYCHIATRIC: No depression, no loss of interest in normal activity or change in sleep pattern.     Exam:   BP 110/70   Ht 5\' 2"  (1.575 m)   Wt 143 lb (64.9 kg)   LMP 09/02/2018   BMI 26.16 kg/m   Body mass index is 26.16 kg/m.  General appearance : Well developed well nourished female. No acute distress HEENT: Eyes: no retinal hemorrhage or exudates,  Neck supple, trachea midline, no carotid bruits, no thyroidmegaly Lungs: Clear to auscultation, no rhonchi or wheezes, or rib retractions  Heart: Regular rate and rhythm, no murmurs or gallops Breast:Examined in sitting and supine position were symmetrical in appearance, no palpable masses or tenderness,  no skin retraction, no nipple inversion, no nipple discharge, no skin discoloration, no axillary or supraclavicular lymphadenopathy Abdomen: no palpable masses or tenderness, no rebound or guarding Extremities: no edema or skin discoloration or tenderness  Pelvic: Vulva: Normal             Vagina: No gross lesions or discharge  Cervix: No gross lesions or discharge  Uterus  AV, normal size, shape and consistency, non-tender and mobile  Adnexa  Without masses or tenderness  Anus: Normal   Assessment/Plan:  49 y.o. female for annual exam   1. Well female exam with routine gynecological exam Stable gynecologic exam and menopause.  Pap test May 2019 was negative, will repeat next year.  Breast exam normal.  Last screening mammogram December 2020 was negative.  Colonoscopy every 3 years for benign polyps.  Health labs with family physician.  Good body mass index at 26.16.  Continue with fitness and healthy nutrition.  2. Postmenopause Well on no hormone replacement therapy.  No postmenopausal bleeding.  Continue with vitamin D supplements, calcium intake of 1200 mg daily and regular weightbearing physical activities.  [name] MD, 2:17 PM 10/01/2019

## 2019-10-01 NOTE — Patient Instructions (Signed)
1. Well female exam with routine gynecological exam Stable gynecologic exam and menopause.  Pap test May 2019 was negative, will repeat next year.  Breast exam normal.  Last screening mammogram December 2020 was negative.  Colonoscopy every 3 years for benign polyps.  Health labs with family physician.  Good body mass index at 26.16.  Continue with fitness and healthy nutrition.  2. Postmenopause Well on no hormone replacement therapy.  No postmenopausal bleeding.  Continue with vitamin D supplements, calcium intake of 1200 mg daily and regular weightbearing physical activities.  Ashley Marshall, it was a pleasure seeing you today!

## 2019-11-04 DIAGNOSIS — Z961 Presence of intraocular lens: Secondary | ICD-10-CM | POA: Diagnosis not present

## 2019-11-04 DIAGNOSIS — J343 Hypertrophy of nasal turbinates: Secondary | ICD-10-CM | POA: Diagnosis not present

## 2019-11-04 DIAGNOSIS — J342 Deviated nasal septum: Secondary | ICD-10-CM | POA: Diagnosis not present

## 2019-11-04 DIAGNOSIS — H31002 Unspecified chorioretinal scars, left eye: Secondary | ICD-10-CM | POA: Diagnosis not present

## 2019-11-04 DIAGNOSIS — H04123 Dry eye syndrome of bilateral lacrimal glands: Secondary | ICD-10-CM | POA: Diagnosis not present

## 2019-11-04 DIAGNOSIS — J310 Chronic rhinitis: Secondary | ICD-10-CM | POA: Diagnosis not present

## 2019-11-06 MED FILL — RESTASIS 0.05% EYE EMULSION: 0.05 | 90 days supply | Qty: 180 | Fill #3

## 2019-11-13 DIAGNOSIS — F411 Generalized anxiety disorder: Secondary | ICD-10-CM | POA: Diagnosis not present

## 2019-11-20 MED FILL — PEG-3350 SOLUTION: 420 | 1 days supply | Qty: 4000 | Fill #0

## 2019-11-22 DIAGNOSIS — Z1159 Encounter for screening for other viral diseases: Secondary | ICD-10-CM | POA: Diagnosis not present

## 2019-11-26 DIAGNOSIS — K449 Diaphragmatic hernia without obstruction or gangrene: Secondary | ICD-10-CM | POA: Diagnosis not present

## 2019-11-26 DIAGNOSIS — Z86010 Personal history of colonic polyps: Secondary | ICD-10-CM | POA: Diagnosis not present

## 2019-11-26 DIAGNOSIS — K5730 Diverticulosis of large intestine without perforation or abscess without bleeding: Secondary | ICD-10-CM | POA: Diagnosis not present

## 2019-11-26 DIAGNOSIS — Z8371 Family history of colonic polyps: Secondary | ICD-10-CM | POA: Diagnosis not present

## 2019-11-26 DIAGNOSIS — K219 Gastro-esophageal reflux disease without esophagitis: Secondary | ICD-10-CM | POA: Diagnosis not present

## 2019-12-05 DIAGNOSIS — F411 Generalized anxiety disorder: Secondary | ICD-10-CM | POA: Diagnosis not present

## 2020-01-01 DIAGNOSIS — M65331 Trigger finger, right middle finger: Secondary | ICD-10-CM | POA: Diagnosis not present

## 2020-01-08 DIAGNOSIS — F411 Generalized anxiety disorder: Secondary | ICD-10-CM | POA: Diagnosis not present

## 2020-02-03 DIAGNOSIS — J342 Deviated nasal septum: Secondary | ICD-10-CM | POA: Diagnosis not present

## 2020-02-03 DIAGNOSIS — J343 Hypertrophy of nasal turbinates: Secondary | ICD-10-CM | POA: Diagnosis not present

## 2020-02-03 DIAGNOSIS — F411 Generalized anxiety disorder: Secondary | ICD-10-CM | POA: Diagnosis not present

## 2020-02-03 DIAGNOSIS — J310 Chronic rhinitis: Secondary | ICD-10-CM | POA: Diagnosis not present

## 2020-02-10 ENCOUNTER — Other Ambulatory Visit (HOSPITAL_COMMUNITY): Payer: Self-pay | Admitting: Ophthalmology

## 2020-02-10 MED FILL — RESTASIS 0.05% EYE EMULSION: 0.05 | 90 days supply | Qty: 180 | Fill #0

## 2020-02-11 DIAGNOSIS — F411 Generalized anxiety disorder: Secondary | ICD-10-CM | POA: Diagnosis not present

## 2020-03-04 ENCOUNTER — Other Ambulatory Visit: Payer: Self-pay | Admitting: Obstetrics & Gynecology

## 2020-03-04 DIAGNOSIS — Z1231 Encounter for screening mammogram for malignant neoplasm of breast: Secondary | ICD-10-CM

## 2020-03-05 DIAGNOSIS — E7800 Pure hypercholesterolemia, unspecified: Secondary | ICD-10-CM | POA: Diagnosis not present

## 2020-03-05 DIAGNOSIS — R5383 Other fatigue: Secondary | ICD-10-CM | POA: Diagnosis not present

## 2020-03-05 DIAGNOSIS — E559 Vitamin D deficiency, unspecified: Secondary | ICD-10-CM | POA: Diagnosis not present

## 2020-03-05 DIAGNOSIS — Z0000 Encounter for general adult medical examination without abnormal findings: Secondary | ICD-10-CM | POA: Diagnosis not present

## 2020-03-06 MED FILL — ROSUVASTATIN CALCIUM 5 MG T: 5 | 90 days supply | Qty: 90 | Fill #0

## 2020-03-09 DIAGNOSIS — F411 Generalized anxiety disorder: Secondary | ICD-10-CM | POA: Diagnosis not present

## 2020-03-17 IMAGING — US US ABDOMEN COMPLETE
1 series · 13 of 25 positions shown · non-contrast
Comparison: None in PACs

CLINICAL DATA: One year of epigastric pain

EXAM:
ABDOMEN ULTRASOUND COMPLETE

[Series 1: us abdomen complete · 13 of 112 slices shown]
[im 1/112]
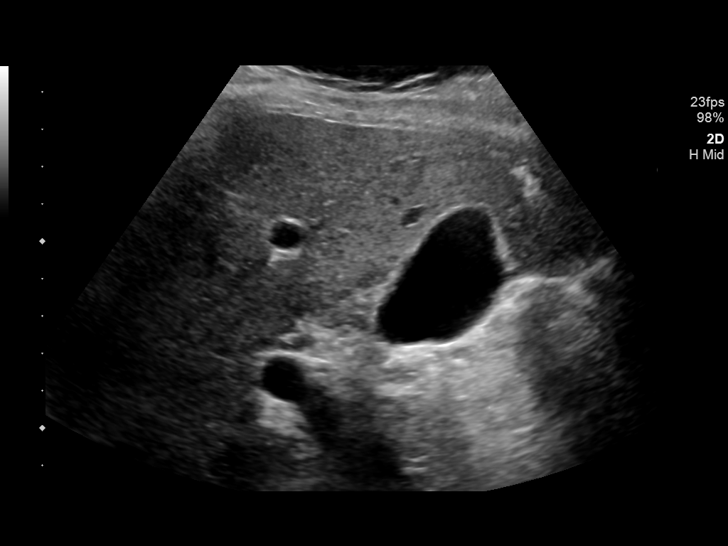
[im 10/112]
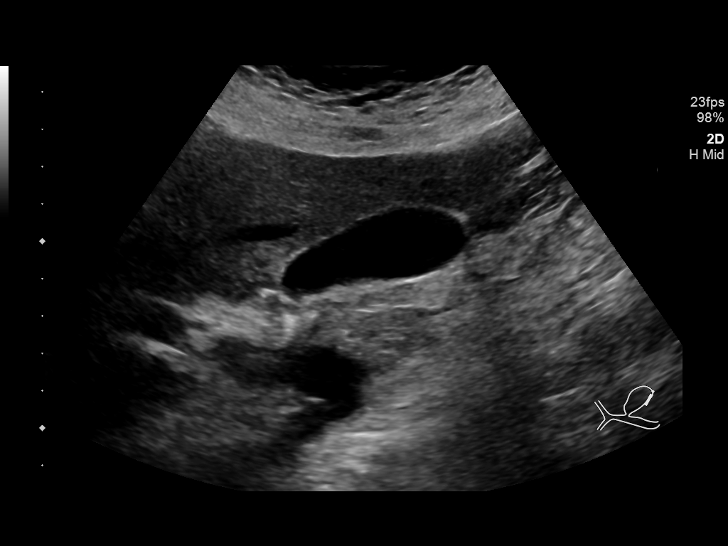
[im 19/112]
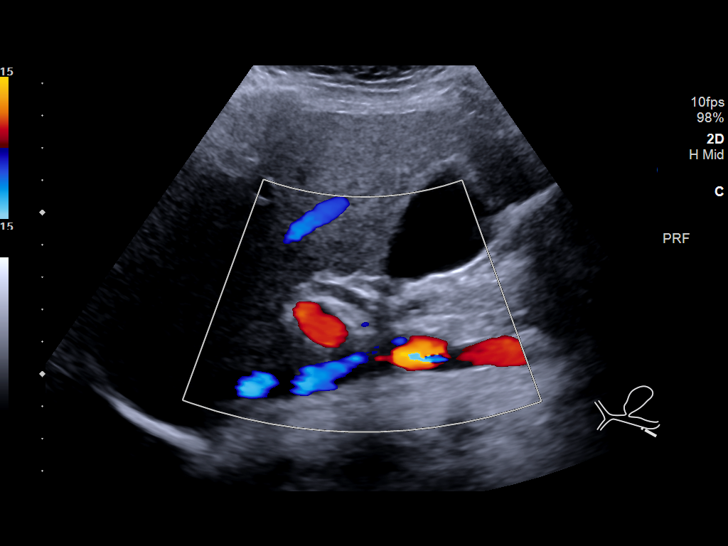
[im 28/112]
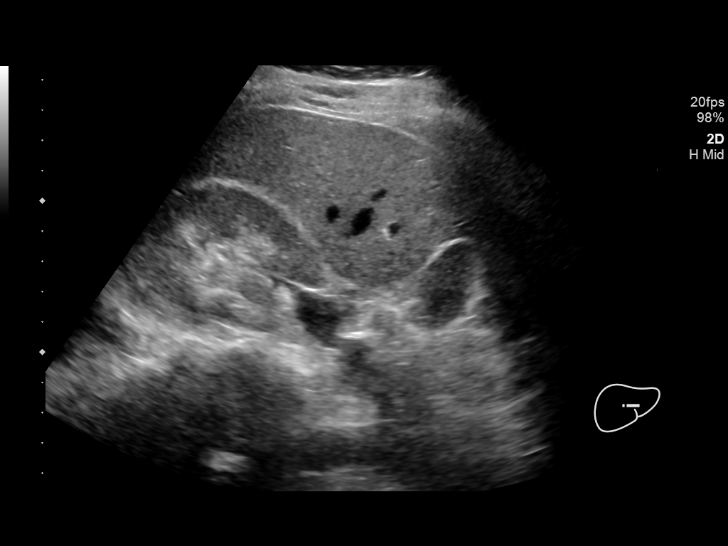
[im 38/112]
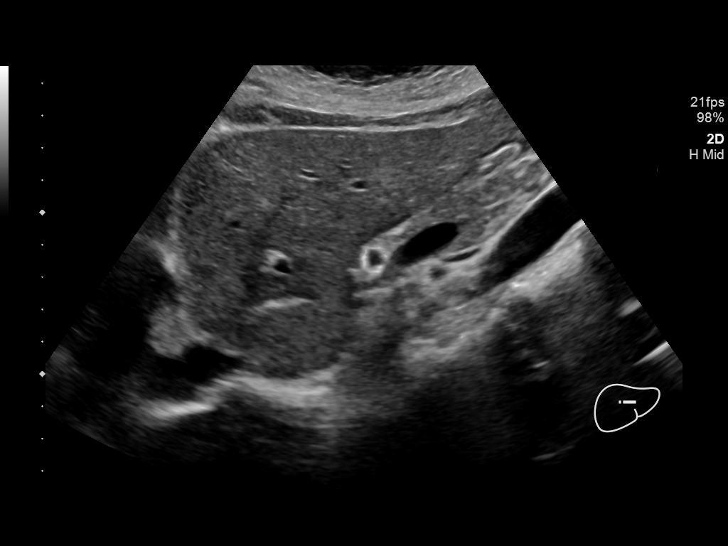
[im 47/112]
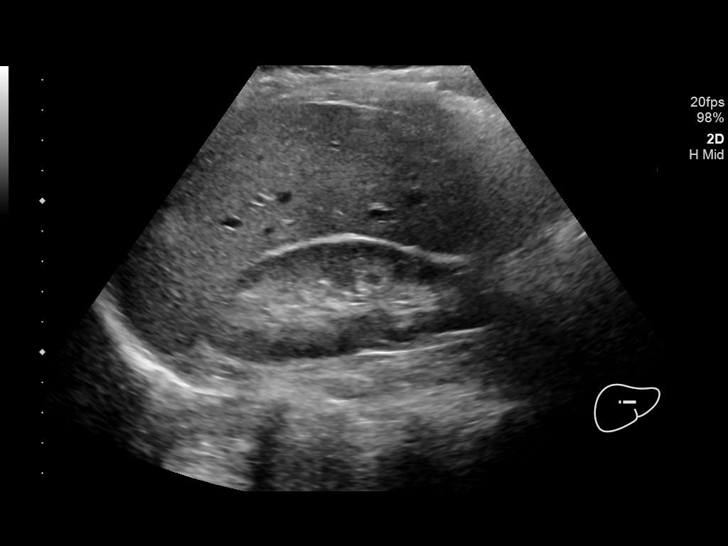
[im 56/112]
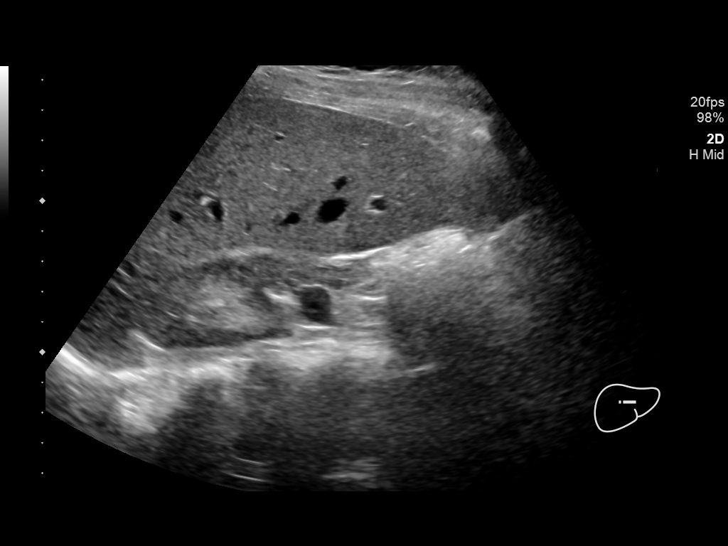
[im 65/112]
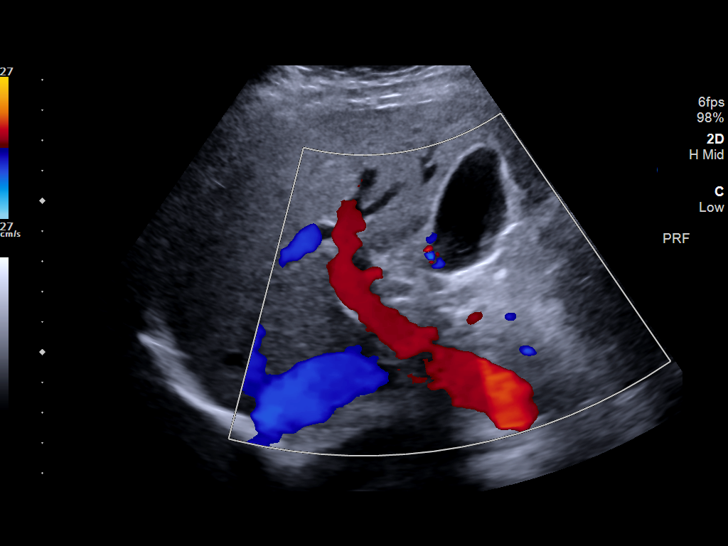
[im 75/112]
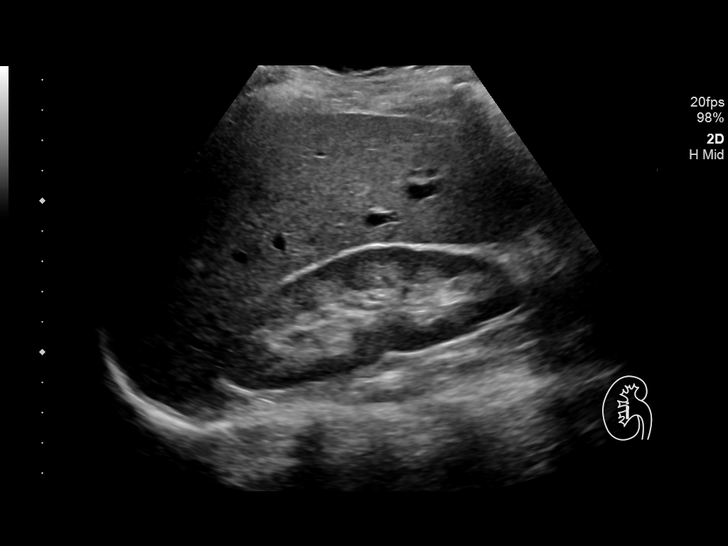
[im 84/112]
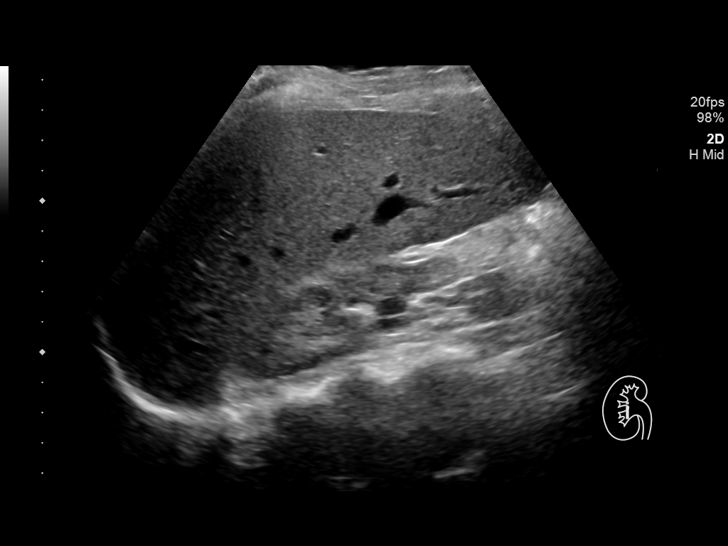
[im 93/112]
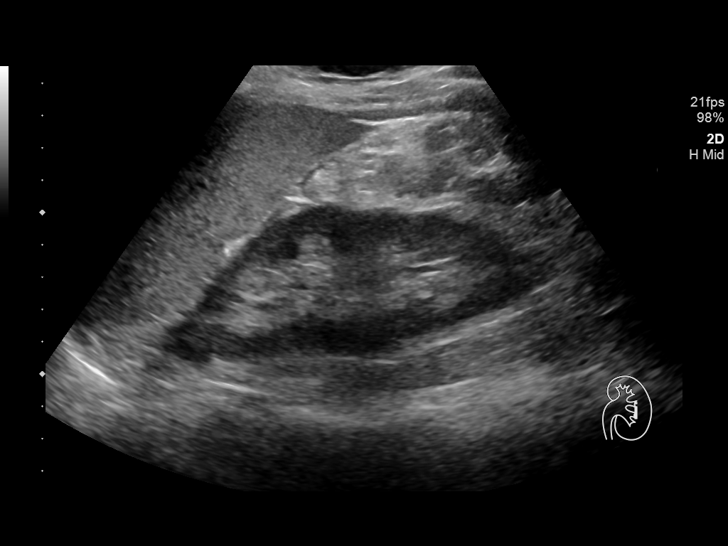
[im 102/112]
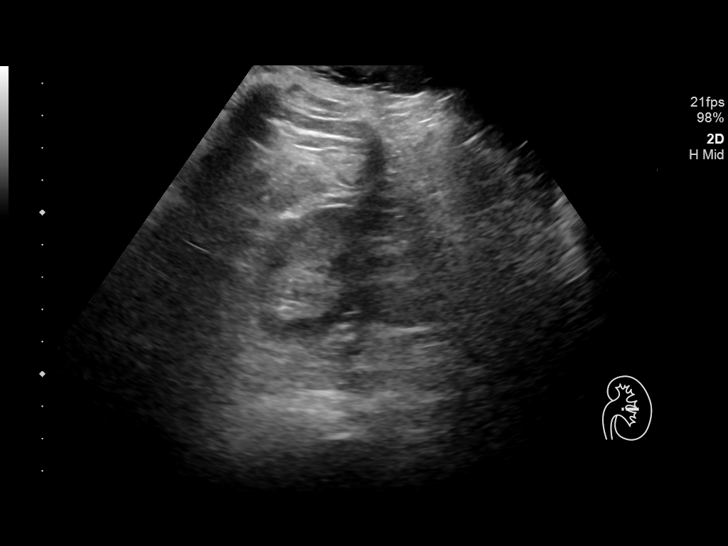
[im 112/112]
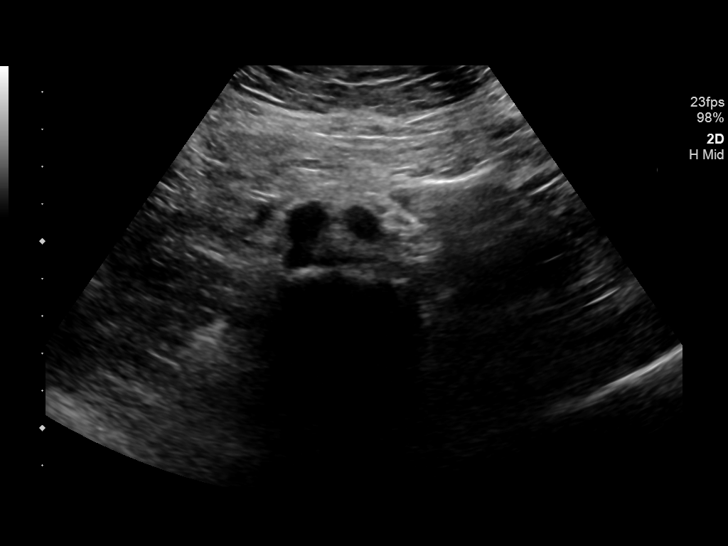

[13 of 25 positions shown; findings below may reference images not displayed]

FINDINGS: Gallbladder: No gallstones or wall thickening visualized. No
sonographic Murphy sign noted by sonographer.

Common bile duct: Diameter: 4.2 mm

Liver: The hepatic echotexture is within the limits of normal. There
is a focal hyperechoic focus in the right lobe measuring 1.2 x 1.4 x
1.2 cm. There is no intrahepatic ductal dilation. The surface
contour is smooth. Portal vein is patent on color Doppler imaging
with normal direction of blood flow towards the liver.

IVC: No abnormality visualized.

Pancreas: Visualized portion unremarkable.

Spleen: Size and appearance within normal limits.

Right Kidney: Length: 11.1 cm. Echogenicity within normal limits. No
mass or hydronephrosis visualized.

Left Kidney: Length: 12.1 cm. Echogenicity within normal limits. No
mass or hydronephrosis visualized.

Abdominal aorta: No aneurysm visualized.

Other findings: There is no ascites.
IMPRESSION: No gallstones or sonographic evidence of acute cholecystitis. If
there are clinical concerns of gallbladder dysfunction, a nuclear
medicine hepatobiliary scan with gallbladder ejection fraction
determination may be useful.

Probable hemangioma in the right hepatic lobe measuring 1.4 cm in
diameter. Follow-up hepatic ultrasound examination in 6 months is
recommended to assure stability.

## 2020-03-18 DIAGNOSIS — L821 Other seborrheic keratosis: Secondary | ICD-10-CM | POA: Diagnosis not present

## 2020-03-18 DIAGNOSIS — Z86018 Personal history of other benign neoplasm: Secondary | ICD-10-CM | POA: Diagnosis not present

## 2020-03-18 DIAGNOSIS — Z411 Encounter for cosmetic surgery: Secondary | ICD-10-CM | POA: Diagnosis not present

## 2020-03-18 DIAGNOSIS — L719 Rosacea, unspecified: Secondary | ICD-10-CM | POA: Diagnosis not present

## 2020-03-18 DIAGNOSIS — D225 Melanocytic nevi of trunk: Secondary | ICD-10-CM | POA: Diagnosis not present

## 2020-03-18 DIAGNOSIS — L814 Other melanin hyperpigmentation: Secondary | ICD-10-CM | POA: Diagnosis not present

## 2020-03-23 DIAGNOSIS — F411 Generalized anxiety disorder: Secondary | ICD-10-CM | POA: Diagnosis not present

## 2020-04-02 ENCOUNTER — Ambulatory Visit: Payer: 59 | Admitting: Cardiology

## 2020-04-15 DIAGNOSIS — F411 Generalized anxiety disorder: Secondary | ICD-10-CM | POA: Diagnosis not present

## 2020-04-17 ENCOUNTER — Other Ambulatory Visit (HOSPITAL_COMMUNITY): Payer: Self-pay | Admitting: Dermatology

## 2020-04-21 ENCOUNTER — Ambulatory Visit: Payer: 59 | Admitting: Cardiology

## 2020-04-21 ENCOUNTER — Encounter: Payer: Self-pay | Admitting: Cardiology

## 2020-04-21 ENCOUNTER — Other Ambulatory Visit: Payer: Self-pay

## 2020-04-21 VITALS — BP 116/80 | HR 94 | Ht 62.0 in | Wt 146.0 lb

## 2020-04-21 DIAGNOSIS — E785 Hyperlipidemia, unspecified: Secondary | ICD-10-CM | POA: Diagnosis not present

## 2020-04-21 DIAGNOSIS — Z8249 Family history of ischemic heart disease and other diseases of the circulatory system: Secondary | ICD-10-CM

## 2020-04-21 NOTE — Patient Instructions (Signed)
Medication Instructions:  The current medical regimen is effective;  continue present plan and medications.  *If you need a refill on your cardiac medications before your next appointment, please call your pharmacy*  Testing/Procedures: Your physician has requested that you have Coronary Calcium Score which is completed by computed tomography (CT) which is a painless test that uses an x-ray machine to take clear, detailed pictures of your heart. This test is completed here at [facility].  There are no instructions.  The cost is $150 due at the time of the test.  Follow-Up: At [facility], you and your health needs are our priority.  As part of our continuing mission to provide you with exceptional heart care, we have created designated Provider Care Teams.  These Care Teams include your primary Cardiologist (physician) and Advanced Practice Providers (APPs -  Physician Assistants and Nurse Practitioners) who all work together to provide you with the care you need, when you need it.  We recommend signing up for the patient portal called "MyChart".  Sign up information is provided on this After Visit Summary.  MyChart is used to connect with patients for Virtual Visits (Telemedicine).  Patients are able to view lab/test results, encounter notes, upcoming appointments, etc.  Non-urgent messages can be sent to your provider as well.   To learn more about what you can do with MyChart, go to [email or web address].    Your next appointment:   Follow up will be based on the results of the above testing.  Thank you for choosing [facility]!!

## 2020-04-21 NOTE — Progress Notes (Signed)
Cardiology Office Note:    Date:  04/21/2020   ID:  Ashley Marshall, DOB May 16, 1971, MRN [id number]  PCP:  Glenis Smoker, MD  [facility] Cardiologist:  Candee Furbish, MD  [facility] Electrophysiologist:  None   Referring MD: Glenis Smoker, *     History of Present Illness:    Ashley Marshall is a 49 y.o. female here for the evaluation of statin use at the request of Dr. Lindell Noe.  In review of prior notes from Dr. Lindell Noe, there was interest in her starting Crestor, however she was hesitant and wanted a another opinion.  Mother died at 32 in her sleep. ? SCD. She took Zocor 80 for many years.   I had seen her back over 3 years ago on 12/07/2016 where she was describing tachycardia dyspnea on mild exertion.  Denied any chest pain.  Non-smoker.   Nondiabetic No hypertension  Previously in her teens had been prone to syncopal episodes.  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  No anginal symptoms.  Doing well.     Past Medical History:  Diagnosis Date  . Constipation   . GERD (gastroesophageal reflux disease)    no meds - diet controlled  . Herpes   . History of kidney stones    passed stones - no surgery required  . HPV in female   . Hyperlipidemia    diet controlled - no meds  . Missed ab    no surgery required  . Seasonal allergies   . SVD (spontaneous vaginal delivery)    x 2    Past Surgical History:  Procedure Laterality Date  . CATARACT EXTRACTION Bilateral   . COLONOSCOPY     x 2 -hx polyps  . COLPOSCOPY    . DILATATION & CURETTAGE/HYSTEROSCOPY WITH MYOSURE N/A 11/04/2016   Procedure: DILATATION & CURETTAGE/HYSTEROSCOPY WITH MYOSURE;  Surgeon: [name], MD;  Location: [facility];  Service: Gynecology;  Laterality: N/A;  . RETINAL DETACHMENT SURGERY Left 2000  . ROBOTIC ASSISTED LAPAROSCOPIC OVARIAN CYSTECTOMY Left 11/04/2016   Procedure:  ROBOTIC ASSISTED LAPAROSCOPIC OVARIAN CYSTECTOMY;  Surgeon: [name], MD;  Location: [facility];  Service: Gynecology;  Laterality: Left;  RNFA confirmed on 10/26/16 with chass  . ROBOTIC ASSISTED SALPINGO OOPHERECTOMY Right 11/04/2016   Procedure: ROBOTIC ASSISTED SALPINGO OOPHORECTOMY;  Surgeon: [name], MD;  Location: [facility];  Service: Gynecology;  Laterality: Right;  . WISDOM TOOTH EXTRACTION      Current Medications: Current Meds  Medication Sig  . acetaminophen (TYLENOL) 500 MG tablet Take 500 mg by mouth every 6 (six) hours as needed.  . cycloSPORINE (RESTASIS) 0.05 % ophthalmic emulsion 1 drop 2 (two) times daily.  . diazepam (DIASTAT ACUDIAL) 10 MG GEL Place rectally once.  . polyethylene glycol (MIRALAX / GLYCOLAX) packet Take 17 g by mouth daily.  Marland Kitchen Specialty Vitamins Products (MAGNESIUM, AMINO ACID CHELATE,) 133 MG tablet Take 1 tablet by mouth 2 (two) times daily.  . valACYclovir (VALTREX) 500 MG tablet Take 500 mg by mouth daily as needed.   Marland Kitchen VITAMIN D PO Take 1 tablet by mouth daily.     Allergies:   Ambien [zolpidem tartrate], Flagyl [metronidazole], and Septra [sulfamethoxazole-trimethoprim]   Social History   Socioeconomic History  . Marital status: Married    Spouse name: Not on file  . Number of children: Not on file  . Years  of education: Not on file  . Highest education level: Not on file  Occupational History  . Not on file  Tobacco Use  . Smoking status: Never Smoker  . Smokeless tobacco: Never Used  Vaping Use  . Vaping Use: Never used  Substance and Sexual Activity  . Alcohol use: Yes    Alcohol/week: 2.0 standard drinks    Types: 2 Glasses of wine per week    Comment: 2 glasses of wine a week   . Drug use: No  . Sexual activity: Yes    Partners: Male    Comment: 1st intercourse- 74, partners- 2, married- 31 yrs   Other Topics Concern  . Not on file  Social History Narrative  . Not on file   Social Determinants of Health    Financial Resource Strain:   . Difficulty of Paying Living Expenses: Not on file  Food Insecurity:   . Worried About Charity fundraiser in the Last Year: Not on file  . Ran Out of Food in the Last Year: Not on file  Transportation Needs:   . Lack of Transportation (Medical): Not on file  . Lack of Transportation (Non-Medical): Not on file  Physical Activity:   . Days of Exercise per Week: Not on file  . Minutes of Exercise per Session: Not on file  Stress:   . Feeling of Stress : Not on file  Social Connections:   . Frequency of Communication with Friends and Family: Not on file  . Frequency of Social Gatherings with Friends and Family: Not on file  . Attends Religious Services: Not on file  . Active Member of Clubs or Organizations: Not on file  . Attends Archivist Meetings: Not on file  . Marital Status: Not on file     Family History: The patient's family history includes Breast cancer in her paternal aunt; Cancer in her paternal grandfather, paternal grandmother, and paternal uncle; Diabetes in her father and paternal grandmother.  ROS:   Please see the history of present illness.    No fevers chills nausea vomiting syncope bleeding all other systems reviewed and are negative.  EKGs/Labs/Other Studies Reviewed:    The following studies were reviewed today:  ECHO 12/09/16:  - Left ventricle: GLSS is midly abnormal at -14.2% The cavity size  was normal. Systolic function was normal. The estimated ejection  fraction was in the range of 55% to 60%. Wall motion was normal;  there were no regional wall motion abnormalities. There was an  increased relative contribution of atrial contraction to  ventricular filling. Doppler parameters are consistent with  abnormal left ventricular relaxation (grade 1 diastolic  dysfunction).  - Aortic valve: Mildly calcified annulus. Trileaflet; normal  thickness leaflets. Valve area (VTI): 1.9 cm^2. Valve area   (Vmax): 1.96 cm^2. Valve area (Vmean): 1.98 cm^2.  - Atrial septum: There was increased thickness of the septum,  consistent with lipomatous hypertrophy.  - Tricuspid valve: There was trivial regurgitation.   EKG: EKG from today shows sinus rhythm 94 with no other abnormalities.  EKG from 2018 shows sinus rhythm 87 with no other abnormalities.  Recent Labs: No results found for requested labs within last 8760 hours.  Recent Lipid Panel No results found for: CHOL, TRIG, HDL, CHOLHDL, VLDL, LDLCALC, LDLDIRECT   Risk Assessment/Calculations:       Physical Exam:    VS:  BP 116/80 (BP Location: Left Arm, Patient Position: Sitting, Cuff Size: Normal)   Pulse 94  Ht 5\' 2"  (1.575 m)   Wt 146 lb (66.2 kg)   LMP 09/02/2018   SpO2 97%   BMI 26.70 kg/m     Wt Readings from Last 3 Encounters:  04/21/20 146 lb (66.2 kg)  10/01/19 143 lb (64.9 kg)  09/26/18 145 lb (65.8 kg)     GEN:  Well nourished, well developed in no acute distress HEENT: Normal NECK: No JVD; No carotid bruits LYMPHATICS: No lymphadenopathy CARDIAC: RRR, no murmurs, rubs, gallops RESPIRATORY:  Clear to auscultation without rales, wheezing or rhonchi  ABDOMEN: Soft, non-tender, non-distended MUSCULOSKELETAL:  No edema; No deformity  SKIN: Warm and dry NEUROLOGIC:  Alert and oriented x 3 PSYCHIATRIC:  Normal affect   ASSESSMENT:    1. Hyperlipidemia, unspecified hyperlipidemia type   2. Family history of cardiovascular disease    PLAN:    In order of problems listed above:  Hyperlipidemia -Statin medication was suggested by her primary physician, Dr. Lindell Noe. -We will check a coronary calcium score given her family history, asymptomatic status.  This will help [place] decide on further statin use.  If there is evidence of coronary atherosclerosis or aortic atherosclerosis, I will promote Crestor use.  Discussed the pros and cons risks and benefits. -LDL 154 triglycerides 178 total cholesterol 238 ALT  14 TSH 1.66 creatinine 0.68 sodium 140 potassium 4.7 hemoglobin 14.7 from outside labs  Prior near-syncope/shortness of breath/sinus tachycardia -Echocardiogram performed in 2018 was reassuring with normal pump function.  No recent issues.  Doing very well.  Mother's recent death at age 55 from possible sudden cardiac death in her sleep -Once again, echocardiogram did not show any evidence of any hypertrophic cardiomyopathy.  Normal pump function.  Checking coronary calcium score.    Shared Decision Making/Informed Consent        Medication Adjustments/Labs and Tests Ordered: Current medicines are reviewed at length with the patient today.  Concerns regarding medicines are outlined above.  Orders Placed This Encounter  Procedures  . CT CARDIAC SCORING  . EKG 12-Lead   No orders of the defined types were placed in this encounter.   Patient Instructions  Medication Instructions:  The current medical regimen is effective;  continue present plan and medications.  *If you need a refill on your cardiac medications before your next appointment, please call your pharmacy*  Testing/Procedures: Your physician has requested that you have Coronary Calcium Score which is completed by computed tomography (CT) which is a painless test that uses an x-ray machine to take clear, detailed pictures of your heart. This test is completed here at [place].  There are no instructions.  The cost is $150 due at the time of the test.  Follow-Up: At [facility], you and your health needs are our priority.  As part of our continuing mission to provide you with exceptional heart care, we have created designated Provider Care Teams.  These Care Teams include your primary Cardiologist (physician) and Advanced Practice Providers (APPs -  Physician Assistants and Nurse Practitioners) who all work together to provide you with the care you need, when you need it.  We recommend signing up for the patient  portal called "MyChart".  Sign up information is provided on this After Visit Summary.  MyChart is used to connect with patients for Virtual Visits (Telemedicine).  Patients are able to view lab/test results, encounter notes, upcoming appointments, etc.  Non-urgent messages can be sent to your provider as well.   To learn more about what  you can do with MyChart, go to [email or web address].    Your next appointment:   Follow up will be based on the results of the above testing.  Thank you for choosing [facility]!!          Signed, Candee Furbish, MD  04/21/2020 10:45 AM    [place]

## 2020-04-23 ENCOUNTER — Other Ambulatory Visit (HOSPITAL_COMMUNITY): Payer: Self-pay | Admitting: Family Medicine

## 2020-04-23 MED FILL — VALACYCLOVIR HCL 500 MG TAB: 500 | 30 days supply | Qty: 30 | Fill #0

## 2020-04-27 ENCOUNTER — Ambulatory Visit
Admission: RE | Admit: 2020-04-27 | Discharge: 2020-04-27 | Disposition: A | Discharge status: Home / Self Care | Payer: 59 | Source: Ambulatory Visit | Attending: Obstetrics & Gynecology | Admitting: Obstetrics & Gynecology

## 2020-04-27 ENCOUNTER — Other Ambulatory Visit: Payer: Self-pay

## 2020-04-27 DIAGNOSIS — Z1231 Encounter for screening mammogram for malignant neoplasm of breast: Secondary | ICD-10-CM | POA: Diagnosis not present

## 2020-04-30 ENCOUNTER — Ambulatory Visit (INDEPENDENT_AMBULATORY_CARE_PROVIDER_SITE_OTHER)
Admission: RE | Admit: 2020-04-30 | Discharge: 2020-04-30 | Disposition: A | Discharge status: Home / Self Care | Payer: Self-pay | Source: Ambulatory Visit | Attending: Cardiology | Admitting: Cardiology

## 2020-04-30 ENCOUNTER — Other Ambulatory Visit: Payer: Self-pay

## 2020-04-30 DIAGNOSIS — Z8249 Family history of ischemic heart disease and other diseases of the circulatory system: Secondary | ICD-10-CM

## 2020-04-30 DIAGNOSIS — E785 Hyperlipidemia, unspecified: Secondary | ICD-10-CM

## 2020-05-11 MED FILL — RESTASIS 0.05% EYE EMULSION: 0.05 | 90 days supply | Qty: 180 | Fill #1

## 2020-05-11 MED FILL — SOOLANTRA 1% CREAM: 1 | 30 days supply | Qty: 45 | Fill #0

## 2020-05-12 DIAGNOSIS — F411 Generalized anxiety disorder: Secondary | ICD-10-CM | POA: Diagnosis not present

## 2020-06-02 DIAGNOSIS — H338 Other retinal detachments: Secondary | ICD-10-CM | POA: Diagnosis not present

## 2020-06-02 DIAGNOSIS — H5319 Other subjective visual disturbances: Secondary | ICD-10-CM | POA: Diagnosis not present

## 2020-06-02 DIAGNOSIS — H43392 Other vitreous opacities, left eye: Secondary | ICD-10-CM | POA: Diagnosis not present

## 2020-06-02 DIAGNOSIS — H31012 Macula scars of posterior pole (postinflammatory) (post-traumatic), left eye: Secondary | ICD-10-CM | POA: Diagnosis not present

## 2020-06-02 DIAGNOSIS — H59812 Chorioretinal scars after surgery for detachment, left eye: Secondary | ICD-10-CM | POA: Diagnosis not present

## 2020-06-02 DIAGNOSIS — H35372 Puckering of macula, left eye: Secondary | ICD-10-CM | POA: Diagnosis not present

## 2020-06-02 DIAGNOSIS — H43812 Vitreous degeneration, left eye: Secondary | ICD-10-CM | POA: Diagnosis not present

## 2020-06-02 DIAGNOSIS — H35411 Lattice degeneration of retina, right eye: Secondary | ICD-10-CM | POA: Diagnosis not present

## 2020-06-03 ENCOUNTER — Encounter (INDEPENDENT_AMBULATORY_CARE_PROVIDER_SITE_OTHER): Payer: 59 | Admitting: Ophthalmology

## 2020-06-03 ENCOUNTER — Other Ambulatory Visit: Payer: Self-pay

## 2020-06-03 DIAGNOSIS — H35421 Microcystoid degeneration of retina, right eye: Secondary | ICD-10-CM | POA: Diagnosis not present

## 2020-06-03 DIAGNOSIS — H338 Other retinal detachments: Secondary | ICD-10-CM | POA: Diagnosis not present

## 2020-06-03 DIAGNOSIS — H43812 Vitreous degeneration, left eye: Secondary | ICD-10-CM

## 2020-07-06 DIAGNOSIS — F411 Generalized anxiety disorder: Secondary | ICD-10-CM | POA: Diagnosis not present

## 2020-07-10 DIAGNOSIS — M65331 Trigger finger, right middle finger: Secondary | ICD-10-CM | POA: Diagnosis not present

## 2020-08-06 DIAGNOSIS — F411 Generalized anxiety disorder: Secondary | ICD-10-CM | POA: Diagnosis not present

## 2020-08-17 ENCOUNTER — Other Ambulatory Visit (HOSPITAL_COMMUNITY): Payer: Self-pay

## 2020-08-17 MED FILL — Cyclosporine (Ophth) Emulsion 0.05%: OPHTHALMIC | 30 days supply | Qty: 60 | Fill #0 | Status: CN

## 2020-08-18 ENCOUNTER — Other Ambulatory Visit (HOSPITAL_COMMUNITY): Payer: Self-pay

## 2020-08-21 ENCOUNTER — Other Ambulatory Visit (HOSPITAL_COMMUNITY): Payer: Self-pay

## 2020-08-21 MED FILL — Cyclosporine (Ophth) Emulsion 0.05%: OPHTHALMIC | 30 days supply | Qty: 24 | Fill #0 | Status: CN

## 2020-08-25 ENCOUNTER — Other Ambulatory Visit (HOSPITAL_COMMUNITY): Payer: Self-pay

## 2020-09-07 ENCOUNTER — Other Ambulatory Visit (HOSPITAL_COMMUNITY): Payer: Self-pay

## 2020-09-07 MED FILL — Cyclosporine (Ophth) Emulsion 0.05%: OPHTHALMIC | 30 days supply | Qty: 60 | Fill #0 | Status: AC

## 2020-09-11 ENCOUNTER — Other Ambulatory Visit (HOSPITAL_COMMUNITY): Payer: Self-pay

## 2020-09-23 ENCOUNTER — Other Ambulatory Visit (HOSPITAL_COMMUNITY): Payer: Self-pay

## 2020-09-23 DIAGNOSIS — R202 Paresthesia of skin: Secondary | ICD-10-CM | POA: Diagnosis not present

## 2020-09-23 DIAGNOSIS — E7800 Pure hypercholesterolemia, unspecified: Secondary | ICD-10-CM | POA: Diagnosis not present

## 2020-09-23 DIAGNOSIS — M5432 Sciatica, left side: Secondary | ICD-10-CM | POA: Diagnosis not present

## 2020-09-23 MED ORDER — PREDNISONE 50 MG PO TABS
ORAL_TABLET | ORAL | 0 refills | Status: DC
  Filled 2020-09-23: qty 5, 5d supply, fill #0

## 2020-09-28 DIAGNOSIS — F411 Generalized anxiety disorder: Secondary | ICD-10-CM | POA: Diagnosis not present

## 2020-10-01 ENCOUNTER — Other Ambulatory Visit (HOSPITAL_COMMUNITY): Payer: Self-pay

## 2020-10-11 MED FILL — Cyclosporine (Ophth) Emulsion 0.05%: OPHTHALMIC | 90 days supply | Qty: 180 | Fill #1 | Status: AC

## 2020-10-13 ENCOUNTER — Other Ambulatory Visit (HOSPITAL_COMMUNITY): Payer: Self-pay

## 2020-10-16 ENCOUNTER — Other Ambulatory Visit: Payer: Self-pay

## 2020-10-16 ENCOUNTER — Encounter: Payer: Self-pay | Admitting: Obstetrics & Gynecology

## 2020-10-16 ENCOUNTER — Ambulatory Visit (INDEPENDENT_AMBULATORY_CARE_PROVIDER_SITE_OTHER): Payer: 59 | Admitting: Obstetrics & Gynecology

## 2020-10-16 ENCOUNTER — Other Ambulatory Visit (HOSPITAL_COMMUNITY)
Admission: RE | Admit: 2020-10-16 | Discharge: 2020-10-16 | Disposition: A | Discharge status: Home / Self Care | Payer: 59 | Source: Ambulatory Visit | Attending: Obstetrics & Gynecology | Admitting: Obstetrics & Gynecology

## 2020-10-16 VITALS — BP 120/80 | Ht 62.0 in | Wt 143.0 lb

## 2020-10-16 DIAGNOSIS — M6289 Other specified disorders of muscle: Secondary | ICD-10-CM

## 2020-10-16 DIAGNOSIS — Z01419 Encounter for gynecological examination (general) (routine) without abnormal findings: Secondary | ICD-10-CM | POA: Insufficient documentation

## 2020-10-16 DIAGNOSIS — Z780 Asymptomatic menopausal state: Secondary | ICD-10-CM

## 2020-10-16 NOTE — Progress Notes (Signed)
LALANYA RUFENER Sep 06, 1970 [id number]   History:    50 y.o.  G3P2A1L2 Married. Vasectomy. Works at [employer] center. Daughter 65, son 76.  GL:OVFIEPPIRJJOACZYSA presenting for annual gyn exam   YTK:ZSWFUXNATFTDD x 3 years, well on no HRT. FSH 92.9 in 02/2018. No PMB. No pelvic pain. Rarely sexually active, no pain with IC. Breasts normal.  Screening mammo neg 04/2020.BMI 26.16. Walking for fitness. Seen by Dr Rodman [facility]: Pelvic floor dysfunction, did PT and vaginal Valium, possible mild IC.  Cysto-rectocele with mild stable symptoms, prefers observation.  Health labs with [place] neg 2021.  Past medical history,surgical history, family history and social history were all reviewed and documented in the EPIC chart.  Gynecologic History Patient's last menstrual period was 09/02/2018.   Obstetric History OB History  Gravida Para Term Preterm AB Living  3 2     1 2   SAB IAB Ectopic Multiple Live Births  1            # Outcome Date GA Lbr Len/2nd Weight Sex Delivery Anes PTL Lv  3 SAB           2 Para           1 Para              ROS: A ROS was performed and pertinent positives and negatives are included in the history.  GENERAL: No fevers or chills. HEENT: No change in vision, no earache, sore throat or sinus congestion. NECK: No pain or stiffness. CARDIOVASCULAR: No chest pain or pressure. No palpitations. PULMONARY: No shortness of breath, cough or wheeze. GASTROINTESTINAL: No abdominal pain, nausea, vomiting or diarrhea, melena or bright red blood per rectum. GENITOURINARY: No urinary frequency, urgency, hesitancy or dysuria. MUSCULOSKELETAL: No joint or muscle pain, no back pain, no recent trauma. DERMATOLOGIC: No rash, no itching, no lesions. ENDOCRINE: No polyuria, polydipsia, no heat or cold intolerance. No recent change in weight. HEMATOLOGICAL: No anemia or easy bruising or bleeding. NEUROLOGIC: No headache, seizures, numbness, tingling or  weakness. PSYCHIATRIC: No depression, no loss of interest in normal activity or change in sleep pattern.     Exam:   BP 120/80   Ht 5\' 2"  (1.575 m)   Wt 143 lb (64.9 kg)   LMP 09/02/2018   BMI 26.16 kg/m   Body mass index is 26.16 kg/m.  General appearance : Well developed well nourished female. No acute distress HEENT: Eyes: no retinal hemorrhage or exudates,  Neck supple, trachea midline, no carotid bruits, no thyroidmegaly Lungs: Clear to auscultation, no rhonchi or wheezes, or rib retractions  Heart: Regular rate and rhythm, no murmurs or gallops Breast:Examined in sitting and supine position were symmetrical in appearance, no palpable masses or tenderness,  no skin retraction, no nipple inversion, no nipple discharge, no skin discoloration, no axillary or supraclavicular lymphadenopathy Abdomen: no palpable masses or tenderness, no rebound or guarding Extremities: no edema or skin discoloration or tenderness  Pelvic: Vulva: Normal             Vagina: No gross lesions or discharge  Cervix: No gross lesions or discharge.  Pap reflex done.  Uterus  AV, normal size, shape and consistency, non-tender and mobile  Adnexa  Without masses or tenderness  Anus: Normal   Assessment/Plan:  50 y.o. female for annual exam   1. Encounter for routine gynecological examination with Papanicolaou smear of cervix Normal gynecologic exam.  Pap reflex done.  Breast exam normal.  Screening mammogram December 2021 was negative.  Colonoscopy 2021.  Health labs with family physician.  Good body mass index at 26.16.  Continue with fitness and healthy nutrition. - Cytology - PAP( [place])  2. Postmenopause Well on no hormone replacement therapy.  No postmenopausal bleeding.  3. Pelvic floor dysfunction in female History of pelvic floor dysfunction.  Has done physical therapy in the past.  Mildly symptomatic from stable mild cystoscopy rectocele.  Prefers observation.  Recommend Kegel exercises.   We will go back to physical therapy as needed.  [name] MD, 1:15 PM 10/16/2020

## 2020-10-20 ENCOUNTER — Encounter: Payer: Self-pay | Admitting: Obstetrics & Gynecology

## 2020-10-20 LAB — CYTOLOGY - PAP
Diagnosis: NEGATIVE
Diagnosis: REACTIVE

## 2020-10-29 DIAGNOSIS — F411 Generalized anxiety disorder: Secondary | ICD-10-CM | POA: Diagnosis not present

## 2020-11-04 DIAGNOSIS — H43812 Vitreous degeneration, left eye: Secondary | ICD-10-CM | POA: Diagnosis not present

## 2020-11-04 DIAGNOSIS — H04123 Dry eye syndrome of bilateral lacrimal glands: Secondary | ICD-10-CM | POA: Diagnosis not present

## 2020-11-04 DIAGNOSIS — H31002 Unspecified chorioretinal scars, left eye: Secondary | ICD-10-CM | POA: Diagnosis not present

## 2020-11-04 DIAGNOSIS — Z961 Presence of intraocular lens: Secondary | ICD-10-CM | POA: Diagnosis not present

## 2020-11-20 ENCOUNTER — Other Ambulatory Visit (HOSPITAL_COMMUNITY): Payer: Self-pay

## 2020-11-20 MED ORDER — CARESTART COVID-19 HOME TEST VI KIT
PACK | 0 refills | Status: DC
  Filled 2020-11-20: qty 4, 4d supply, fill #0

## 2020-12-08 DIAGNOSIS — F411 Generalized anxiety disorder: Secondary | ICD-10-CM | POA: Diagnosis not present

## 2021-01-07 DIAGNOSIS — F411 Generalized anxiety disorder: Secondary | ICD-10-CM | POA: Diagnosis not present

## 2021-01-15 ENCOUNTER — Other Ambulatory Visit (HOSPITAL_COMMUNITY): Payer: Self-pay

## 2021-01-15 MED ORDER — CARESTART COVID-19 HOME TEST VI KIT
PACK | 0 refills | Status: DC
  Filled 2021-01-15: qty 4, 4d supply, fill #0

## 2021-01-17 MED FILL — Cyclosporine (Ophth) Emulsion 0.05%: OPHTHALMIC | 90 days supply | Qty: 180 | Fill #2 | Status: AC

## 2021-01-18 ENCOUNTER — Other Ambulatory Visit (HOSPITAL_COMMUNITY): Payer: Self-pay

## 2021-01-19 ENCOUNTER — Encounter: Payer: 59 | Attending: Family Medicine | Admitting: Dietician

## 2021-01-19 ENCOUNTER — Ambulatory Visit: Payer: 59 | Admitting: Dietician

## 2021-01-20 ENCOUNTER — Other Ambulatory Visit (HOSPITAL_COMMUNITY): Payer: Self-pay

## 2021-02-17 ENCOUNTER — Encounter: Payer: Self-pay | Admitting: Dietician

## 2021-02-17 ENCOUNTER — Other Ambulatory Visit: Payer: Self-pay

## 2021-02-17 ENCOUNTER — Encounter: Payer: 59 | Attending: Family Medicine | Admitting: Dietician

## 2021-02-17 VITALS — Ht 62.0 in | Wt 144.0 lb

## 2021-02-17 DIAGNOSIS — E782 Mixed hyperlipidemia: Secondary | ICD-10-CM | POA: Diagnosis not present

## 2021-02-17 DIAGNOSIS — R7303 Prediabetes: Secondary | ICD-10-CM | POA: Diagnosis not present

## 2021-02-17 DIAGNOSIS — F411 Generalized anxiety disorder: Secondary | ICD-10-CM | POA: Diagnosis not present

## 2021-02-17 NOTE — Patient Instructions (Addendum)
Begin having Pepsi Zero with your dinner in the evening. You can continue to have your 8oz can of regular Pepsi with lunch. We will continue to work on lowering your consumption of soda gradually. This is a great start!  Try Fairlife brand milk for a high protein, low sugar, lactose free protein option.  When having boiled eggs, only have one yolk.  When eating packaged foods, read your food labels. Look for items with no more than 7% daily value of saturated fat per serving.  Use a combination of these three strategies to effectively lower your consumption of saturated fats 1) Consume a smaller portion when having animal products 2) Consume higher fat animal products less frequently 3) Find a reduced or low fat version

## 2021-02-17 NOTE — Progress Notes (Signed)
Medical Nutrition Therapy  Appointment Start time:  [phone]  Appointment End time:  1610  Primary concerns today: Hyperlipidemia, Prediabetes, Weight Loss  Referral diagnosis: R73.03 - Prediabetes, E78.00 - Pure Hypercholesterolemia Preferred learning style: No preference indicated Learning readiness: Ready   NUTRITION ASSESSMENT   Anthropometrics  Ht: 5'2" Wt: 144 lbs Body mass index is 26.34 kg/m.   Clinical Medical Hx: Prediabetes, HLD, Chronic Joint pain, colon polyps, Lactose Intolerance Medications: Miralax,  Labs: TC - 248, LDL - 151, TGL - 227, A1c - 5.7 Notable Signs/Symptoms: Occasional bloating   Lifestyle & Dietary Hx Pt reports family history of diabetes and cardiovascular disease that has become a wake up call for their own health risks. Pt reports getting a calcification scan on their arteries that didn't reveal significant blockages. Pt states they are ready to make changes, wants to avoid going on cholesterol lowering medication. Pt is working with a therapist to manage stress, states they are a people pleaser and is working on setting boundaries.  Pt reports drinking Pepsi for as long as they can remember, drinks 20 oz soda each day. Pt reports muscle pain when they try to stop drinking soda. Pt reports occasional bloating when eating vegetables/high fiber foods. Pt tried an elimination diet in the past with some success, but was unable to sustain this diet. Pt reports history of constipation that requires daily Miralax. Pt has been working on training for a 5k, used to walk on the weekends with their spouse. Current physical activity is low. Pt is a Marine scientist for work, very active during the day.  Estimated daily fluid intake: 72 oz Supplements: Vitamin D, Magnesium Sleep: N/A Stress / self-care: Moderate stress, working with therapist. Current average weekly physical activity: ADLs, busy at work  24-Hr Dietary Recall First Meal: Banana, nuts, bottle of  water Snack: none Second Meal: Salad with carrots, red beets, chick peas, dried cranberries, cheese, boiled egg, balsamic vinaigrette. 3 packs captain wafer crackers, Hershey mini dark chocolate, 8 oz pepsi Snack: bottle of water Third Meal: Boxed chicken and sausage jambalaya, crescent roll, Dove mini chocolate, 12 oz Pepsi Snack: bottle of water Beverages: Water, Pepsi   NUTRITION DIAGNOSIS  NB-1.1 Food and nutrition-related knowledge deficit As related to Hyperlipidemia.  As evidenced by Serum cholesterol of 248 mg/dL, serum LDL of 151 mg/dL, serum triglycerides of 227 mg/dL, limited exercise, and dietary recall high in saturated fats.   NUTRITION INTERVENTION  Nutrition education (E-1) on the following topics:  Educate pt on the difference between LDL and HDL cholesterol. Educate pt on the factors that can increase and decrease HDL cholesterol. Including exercise to increase HDL, and tobacco use to decrease HDL. Educate pt on factors that can elevate LDL cholesterol, including high dietary intake of saturated fats. Educate pt on identifying sources of saturated fats, and how to make alternative food choices to lower saturated fat intake. Educate pt on the role of soluble fiber in binding to cholesterol in the GI tract an eliminating it from the body. Educate pt on dietary sources of soluble fiber. Educate pt on the potential dietary causes of hypertriglyceridemia.Educate on the role of elevated LDL,total cholesterol, and triglycerides on cardiovascular health. Educate pt on the role of physical activity in lowering LDL and increasing HDL cholesterol.  Educated patient on factors that contribute to elevation of blood sugars, such as stress, illness, injury,and food choices. Discussed the role that physical activity plays in lowering blood sugar.   Handouts Provided Include  Cholesterol Lowering Nutrition  Therapy Nutrition Care Manual Monounsaturated and Polyunsaturated Fats Handout  Learning  Style & Readiness for Change Teaching method utilized: Visual & Auditory  Demonstrated degree of understanding via: Teach Back  Barriers to learning/adherence to lifestyle change: Family dietary preferences  Goals Established by Pt Begin having Pepsi Zero with your dinner in the evening. You can continue to have your 8oz can of regular Pepsi with lunch. We will continue to work on lowering your consumption of soda gradually. This is a great start! Try Fairlife brand milk for a high protein, low sugar, lactose free protein option. When having boiled eggs, only have one yolk. When eating packaged foods, read your food labels. Look for items with no more than 7% daily value of saturated fat per serving. Use a combination of these three strategies to effectively lower your consumption of saturated fats 1) Consume a smaller portion when having animal products 2) Consume higher fat animal products less frequently 3) Find a reduced or low fat version   MONITORING & EVALUATION Dietary intake, weekly physical activity, and saturated fat intake in 6 weeks.  Next Steps  Patient is to follow up with RD.

## 2021-02-24 ENCOUNTER — Other Ambulatory Visit (HOSPITAL_COMMUNITY): Payer: Self-pay

## 2021-02-24 DIAGNOSIS — N3000 Acute cystitis without hematuria: Secondary | ICD-10-CM | POA: Diagnosis not present

## 2021-02-24 DIAGNOSIS — G834 Cauda equina syndrome: Secondary | ICD-10-CM | POA: Diagnosis not present

## 2021-02-24 MED ORDER — PREDNISONE 10 MG (21) PO TBPK
ORAL_TABLET | ORAL | 0 refills | Status: DC
  Filled 2021-02-24: qty 21, 6d supply, fill #0

## 2021-02-25 DIAGNOSIS — J310 Chronic rhinitis: Secondary | ICD-10-CM | POA: Diagnosis not present

## 2021-02-25 DIAGNOSIS — J343 Hypertrophy of nasal turbinates: Secondary | ICD-10-CM | POA: Diagnosis not present

## 2021-02-25 DIAGNOSIS — J342 Deviated nasal septum: Secondary | ICD-10-CM | POA: Diagnosis not present

## 2021-03-16 ENCOUNTER — Other Ambulatory Visit: Payer: Self-pay | Admitting: Family Medicine

## 2021-03-16 DIAGNOSIS — Z1231 Encounter for screening mammogram for malignant neoplasm of breast: Secondary | ICD-10-CM

## 2021-03-22 ENCOUNTER — Other Ambulatory Visit: Payer: Self-pay

## 2021-03-22 ENCOUNTER — Encounter (INDEPENDENT_AMBULATORY_CARE_PROVIDER_SITE_OTHER): Payer: 59 | Admitting: Ophthalmology

## 2021-03-22 ENCOUNTER — Other Ambulatory Visit (HOSPITAL_COMMUNITY): Payer: Self-pay

## 2021-03-22 DIAGNOSIS — H35372 Puckering of macula, left eye: Secondary | ICD-10-CM

## 2021-03-22 DIAGNOSIS — H43813 Vitreous degeneration, bilateral: Secondary | ICD-10-CM

## 2021-03-22 DIAGNOSIS — H26493 Other secondary cataract, bilateral: Secondary | ICD-10-CM

## 2021-03-22 DIAGNOSIS — H33301 Unspecified retinal break, right eye: Secondary | ICD-10-CM | POA: Diagnosis not present

## 2021-03-22 DIAGNOSIS — H3322 Serous retinal detachment, left eye: Secondary | ICD-10-CM

## 2021-03-22 MED ORDER — KETOROLAC TROMETHAMINE 0.5 % OP SOLN
OPHTHALMIC | 1 refills | Status: DC
  Filled 2021-03-22: qty 5, 30d supply, fill #0

## 2021-03-23 DIAGNOSIS — L814 Other melanin hyperpigmentation: Secondary | ICD-10-CM | POA: Diagnosis not present

## 2021-03-23 DIAGNOSIS — L719 Rosacea, unspecified: Secondary | ICD-10-CM | POA: Diagnosis not present

## 2021-03-23 DIAGNOSIS — Z86018 Personal history of other benign neoplasm: Secondary | ICD-10-CM | POA: Diagnosis not present

## 2021-03-23 DIAGNOSIS — L739 Follicular disorder, unspecified: Secondary | ICD-10-CM | POA: Diagnosis not present

## 2021-03-23 DIAGNOSIS — Z23 Encounter for immunization: Secondary | ICD-10-CM | POA: Diagnosis not present

## 2021-03-23 DIAGNOSIS — L821 Other seborrheic keratosis: Secondary | ICD-10-CM | POA: Diagnosis not present

## 2021-03-23 DIAGNOSIS — D225 Melanocytic nevi of trunk: Secondary | ICD-10-CM | POA: Diagnosis not present

## 2021-03-30 ENCOUNTER — Encounter (INDEPENDENT_AMBULATORY_CARE_PROVIDER_SITE_OTHER): Payer: 59 | Admitting: Ophthalmology

## 2021-03-30 ENCOUNTER — Other Ambulatory Visit: Payer: Self-pay

## 2021-03-30 DIAGNOSIS — H33301 Unspecified retinal break, right eye: Secondary | ICD-10-CM

## 2021-04-14 ENCOUNTER — Encounter: Payer: 59 | Attending: Family Medicine | Admitting: Dietician

## 2021-04-14 ENCOUNTER — Other Ambulatory Visit: Payer: Self-pay

## 2021-04-14 ENCOUNTER — Encounter: Payer: Self-pay | Admitting: Dietician

## 2021-04-14 VITALS — Ht 62.0 in | Wt 146.0 lb

## 2021-04-14 DIAGNOSIS — R7303 Prediabetes: Secondary | ICD-10-CM | POA: Diagnosis not present

## 2021-04-14 NOTE — Patient Instructions (Addendum)
Switch your dinner time Pepsi to an 8 oz can from the 12 oz can.   Continue to work on saying "No" when you feel a food choice doesn't align with your goals! You are a positive influence on those around you!  Great job keeping up with your step goal! Remember how it has helped you to feel better.  Consider reading or listening to the book "Hooked" by Salvatore Marvel about the addictive nature of processed foods.

## 2021-04-14 NOTE — Progress Notes (Signed)
Medical Nutrition Therapy  Appointment Start time:  [phone]  Appointment End time:  0920  Primary concerns today: Hyperlipidemia, Prediabetes, Weight Loss  Referral diagnosis: R73.03 - Prediabetes, E78.00 - Pure Hypercholesterolemia Preferred learning style: No preference indicated Learning readiness: Change In Progress   NUTRITION ASSESSMENT   Anthropometrics  Ht: 5'2" Wt: 146 lbs Body mass index is 26.7 kg/m.   Clinical Medical Hx: Prediabetes, HLD, Chronic Joint pain, colon polyps, Lactose Intolerance Medications: Miralax,  Labs: TC - 248, LDL - 151, TGL - 227, A1c - 5.7 Notable Signs/Symptoms: Occasional bloating (Improving)   Lifestyle & Dietary Hx Pt reports leg numbness/tingling in their left leg that originally warranted a course of prednisone, but pt stopped taking it after getting a second opinion. Pt reports this lasted about 4 days and may have been the result of walking a lot the weekend before.  Pt reports more inflammation and mouth sores since trying to lower sugar diet. Pt switched to Pepsi Zero and developed muscle aches in their back/spine region a few after after drinking it. Pt reports noticing less bloating recently. Pt states they have been eating until they are satisfied instead of full now. Pt reports trying to eat more colors on their salads and less crackers.  Pt reports they have been saying "no" to less healthy food choices with their family, pt is proud of themself and their family has been supportive. Pt reports being more involved in the grocery shopping as well. Pt has been practicing portion control on high fat foods. Pt reports more cravings for carbs as the days get shorter, but has been portioning their snacks.  Pt has been trying to bump there steps up up from 7,500 to 8,500-9,000 daily. Pt is considering starting a pilates class next month.   Estimated daily fluid intake: 72 oz Supplements: Vitamin D, Magnesium Sleep: N/A Stress / self-care:  Moderate stress, working with therapist. Current average weekly physical activity: ADLs, busy at work  24-Hr Dietary Recall First Meal: Banana, 100 calorie pack of cashews, 10 grapes Snack: none Second Meal: Salad w/ carrots, beets, tomatoes, cranberries, 1 boiled egg, chickpeas, light vinegarette, 1 pack of crackers Snack: handful of tortilla chips, hummus Third Meal: 4 Beef tacos with cheese, lettuce, 10 peanut M&Ms, Pepsi Snack: bottle of water Beverages: 3 bottles Water, Pepsi   NUTRITION DIAGNOSIS  NB-1.1 Food and nutrition-related knowledge deficit As related to Hyperlipidemia.  As evidenced by Serum cholesterol of 248 mg/dL, serum LDL of 151 mg/dL, serum triglycerides of 227 mg/dL, limited exercise, and dietary recall high in saturated fats.   NUTRITION INTERVENTION  Nutrition education (E-1) on the following topics:  Educate pt on the difference between LDL and HDL cholesterol. Educate pt on the factors that can increase and decrease HDL cholesterol. Including exercise to increase HDL, and tobacco use to decrease HDL. Educate pt on factors that can elevate LDL cholesterol, including high dietary intake of saturated fats. Educate pt on identifying sources of saturated fats, and how to make alternative food choices to lower saturated fat intake. Educate pt on the role of soluble fiber in binding to cholesterol in the GI tract an eliminating it from the body. Educate pt on dietary sources of soluble fiber. Educate pt on the potential dietary causes of hypertriglyceridemia.Educate on the role of elevated LDL,total cholesterol, and triglycerides on cardiovascular health. Educate pt on the role of physical activity in lowering LDL and increasing HDL cholesterol.  Educated patient on factors that contribute to elevation of blood  sugars, such as stress, illness, injury,and food choices. Discussed the role that physical activity plays in lowering blood sugar.   Handouts Provided Include   Cholesterol Lowering Nutrition Therapy Nutrition Care Manual Monounsaturated and Polyunsaturated Fats Handout  Learning Style & Readiness for Change Teaching method utilized: Visual & Auditory  Demonstrated degree of understanding via: Teach Back  Barriers to learning/adherence to lifestyle change: Family dietary preferences NEW: Pt has begun to establish autonomy in the family meals, choosing healthier options instead of what everyone else wants to eat.  Goals Established by Pt Switch your dinner time Pepsi to an 8 oz can from the 12 oz can.  Continue to work on saying "No" when you feel a food choice doesn't align with your goals! You are a positive influence on those around you! Great job keeping up with your step goal! Remember how it has helped you to feel better. Consider reading or listening to the book "Hooked" by Salvatore Marvel about the addictive nature of processed foods.   MONITORING & EVALUATION Dietary intake, weekly physical activity, and saturated fat intake in 8 weeks.  Next Steps  Patient is to follow up with RD.

## 2021-04-16 ENCOUNTER — Other Ambulatory Visit (HOSPITAL_COMMUNITY): Payer: Self-pay

## 2021-04-17 ENCOUNTER — Other Ambulatory Visit (HOSPITAL_COMMUNITY): Payer: Self-pay

## 2021-04-19 ENCOUNTER — Other Ambulatory Visit (HOSPITAL_COMMUNITY): Payer: Self-pay

## 2021-04-19 DIAGNOSIS — F411 Generalized anxiety disorder: Secondary | ICD-10-CM | POA: Diagnosis not present

## 2021-04-19 MED ORDER — CYCLOSPORINE 0.05 % OP EMUL
OPHTHALMIC | 12 refills | Status: DC
  Filled 2021-04-19: qty 180, 90d supply, fill #0
  Filled 2021-07-19: qty 180, 90d supply, fill #1
  Filled 2021-10-19: qty 180, 90d supply, fill #2
  Filled 2022-01-24: qty 180, 90d supply, fill #3

## 2021-04-28 ENCOUNTER — Ambulatory Visit
Admission: RE | Admit: 2021-04-28 | Discharge: 2021-04-28 | Disposition: A | Discharge status: Home / Self Care | Payer: 59 | Source: Ambulatory Visit

## 2021-04-28 DIAGNOSIS — Z1231 Encounter for screening mammogram for malignant neoplasm of breast: Secondary | ICD-10-CM

## 2021-05-03 DIAGNOSIS — F411 Generalized anxiety disorder: Secondary | ICD-10-CM | POA: Diagnosis not present

## 2021-05-13 DIAGNOSIS — F411 Generalized anxiety disorder: Secondary | ICD-10-CM | POA: Diagnosis not present

## 2021-05-28 ENCOUNTER — Other Ambulatory Visit (HOSPITAL_COMMUNITY): Payer: Self-pay

## 2021-05-28 DIAGNOSIS — R7301 Impaired fasting glucose: Secondary | ICD-10-CM | POA: Diagnosis not present

## 2021-05-28 DIAGNOSIS — R1084 Generalized abdominal pain: Secondary | ICD-10-CM | POA: Diagnosis not present

## 2021-05-28 DIAGNOSIS — M62838 Other muscle spasm: Secondary | ICD-10-CM | POA: Diagnosis not present

## 2021-05-28 MED ORDER — DICYCLOMINE HCL 10 MG PO CAPS
ORAL_CAPSULE | ORAL | 0 refills | Status: DC
  Filled 2021-05-28: qty 60, 10d supply, fill #0

## 2021-05-28 MED ORDER — CYCLOBENZAPRINE HCL 10 MG PO TABS
ORAL_TABLET | ORAL | 0 refills | Status: DC
  Filled 2021-05-28: qty 30, 30d supply, fill #0

## 2021-05-29 ENCOUNTER — Other Ambulatory Visit (HOSPITAL_COMMUNITY): Payer: Self-pay

## 2021-06-04 DIAGNOSIS — F411 Generalized anxiety disorder: Secondary | ICD-10-CM | POA: Diagnosis not present

## 2021-06-07 ENCOUNTER — Other Ambulatory Visit (HOSPITAL_COMMUNITY): Payer: Self-pay

## 2021-06-14 DIAGNOSIS — R1030 Lower abdominal pain, unspecified: Secondary | ICD-10-CM | POA: Diagnosis not present

## 2021-06-14 DIAGNOSIS — R8290 Unspecified abnormal findings in urine: Secondary | ICD-10-CM | POA: Diagnosis not present

## 2021-06-15 ENCOUNTER — Encounter: Payer: 59 | Attending: Family Medicine | Admitting: Dietician

## 2021-06-15 ENCOUNTER — Other Ambulatory Visit: Payer: Self-pay

## 2021-06-15 ENCOUNTER — Encounter: Payer: Self-pay | Admitting: Dietician

## 2021-06-15 VITALS — Ht 62.0 in | Wt 143.2 lb

## 2021-06-15 DIAGNOSIS — R7303 Prediabetes: Secondary | ICD-10-CM | POA: Insufficient documentation

## 2021-06-15 DIAGNOSIS — R8290 Unspecified abnormal findings in urine: Secondary | ICD-10-CM | POA: Diagnosis not present

## 2021-06-15 NOTE — Patient Instructions (Addendum)
Congratulations on lowering your A1c back to the normal range!!   Keep up the great work! You are doing an amazing job! I am very proud of your resolve and perseverance!  Try to soft cook all of your vegetables. Steam your veggies to retain the most nutrients. Look into an "OXO Silicone Steaming Basket"  Begin to move to having an 8 oz. Pepsi with your dinner.

## 2021-06-15 NOTE — Progress Notes (Signed)
Medical Nutrition Therapy  Appointment Start time:  [phone]  Appointment End time:  0920  Primary concerns today: Hyperlipidemia, Prediabetes, Weight Loss  Referral diagnosis: R73.03 - Prediabetes, E78.00 - Pure Hypercholesterolemia Preferred learning style: No preference indicated Learning readiness: Change In Progress   NUTRITION ASSESSMENT   Anthropometrics  Ht: 5'2" Wt: 143.2 lbs Wt Change: -3 lbs Body mass index is 26.19 kg/m.   Clinical Medical Hx: Prediabetes, HLD, Chronic Joint pain, colon polyps, Lactose Intolerance, Kidney Stones Medications: Miralax NEW: Dicyclomine, Cyclobenzaprine Labs: TC - 248, LDL - 151, TGL - 227, NEW: (05/28/2021) A1c - 5.6  Notable Signs/Symptoms: Occasional bloating (Improving)   Lifestyle & Dietary Hx Pt had A1c checked and it is now back down 5.6. Pt reports traumatic family event in the month of December. Pt is currently seeing their therapist bi-weekly, states this has been very helpful for them to take care of themselves. Pt reports eating comfort food during this period and noticing that eating too much made them feel bad. Pt reports a severe GI flare up due to stress and visited the doctor. Pt reports RLQ pain that occasionally radiated to their back, and bloating. Pt states doctor believes it is either an IBS flare-up or hernia. Pt reports they are trying to schedule an ultrasound ASAP. Pt reports snacking on raw veggies which has led to GI pain, pt has no symptoms from cooked vegetables. Pt reports they have been going to the grocery with their husband which has helped them pick out healthier food choices and have more control of the food in their house. Pt has also started preparing their own coffee to minimize the sugar in it. Pt reports alternating Pepsi with water when they drink it, states it helps them consume less.   Estimated daily fluid intake: 72 oz Supplements: Vitamin D, Magnesium Sleep: N/A Stress / self-care: Moderate  stress, working with therapist. Current average weekly physical activity: ADLs, busy at work  24-Hr Dietary Recall First Meal: Ham and egg biscuit, strawberries, banana, water Snack: Red grapes Second Meal: Salad w/ chick peas, red beets, boiled egg, shredded carrots, low fat vinaigrette, 2 packs crackers Snack: handful of tortilla chips Third Meal: Progresso Chicken Noodle, PB and J sandwich Snack: bottle of water Beverages: 3 bottles Water   NUTRITION DIAGNOSIS  NB-1.1 Food and nutrition-related knowledge deficit As related to Hyperlipidemia.  As evidenced by Serum cholesterol of 248 mg/dL, serum LDL of 151 mg/dL, serum triglycerides of 227 mg/dL, limited exercise, and dietary recall high in saturated fats.   NUTRITION INTERVENTION  Nutrition education (E-1) on the following topics:  Educate pt on the difference between LDL and HDL cholesterol. Educate pt on the factors that can increase and decrease HDL cholesterol. Including exercise to increase HDL, and tobacco use to decrease HDL. Educate pt on factors that can elevate LDL cholesterol, including high dietary intake of saturated fats. Educate pt on identifying sources of saturated fats, and how to make alternative food choices to lower saturated fat intake. Educate pt on the role of soluble fiber in binding to cholesterol in the GI tract an eliminating it from the body. Educate pt on dietary sources of soluble fiber. Educate pt on the potential dietary causes of hypertriglyceridemia.Educate on the role of elevated LDL,total cholesterol, and triglycerides on cardiovascular health. Educate pt on the role of physical activity in lowering LDL and increasing HDL cholesterol.  Educated patient on factors that contribute to elevation of blood sugars, such as stress, illness, injury,and food choices.  Discussed the role that physical activity plays in lowering blood sugar.   Handouts Provided Include  Cholesterol Lowering Nutrition Therapy  Nutrition Care Manual Monounsaturated and Polyunsaturated Fats Handout  Learning Style & Readiness for Change Teaching method utilized: Visual & Auditory  Demonstrated degree of understanding via: Teach Back  Barriers to learning/adherence to lifestyle change: Family dietary preferences. Pt has begun to establish autonomy in the family meals, choosing healthier options instead of what everyone else wants to eat.  Goals Established by Pt Congratulations on lowering your A1c back to the normal range!!  Keep up the great work! You are doing an amazing job! I am very proud of your resolve and perseverance! Try to soft cook all of your vegetables. Steam your veggies to retain the most nutrients. Look into an "OXO Silicone Steaming Basket" Begin to move to having an 8 oz. Pepsi with your dinner.  MONITORING & EVALUATION Dietary intake, weekly physical activity, and saturated fat intake in 3 months.  Next Steps  Patient is to follow up with RD.

## 2021-06-16 ENCOUNTER — Ambulatory Visit (HOSPITAL_BASED_OUTPATIENT_CLINIC_OR_DEPARTMENT_OTHER)
Admission: RE | Admit: 2021-06-16 | Discharge: 2021-06-16 | Disposition: A | Discharge status: Home / Self Care | Payer: 59 | Source: Ambulatory Visit | Attending: Family Medicine | Admitting: Family Medicine

## 2021-06-16 ENCOUNTER — Other Ambulatory Visit (HOSPITAL_BASED_OUTPATIENT_CLINIC_OR_DEPARTMENT_OTHER): Payer: Self-pay | Admitting: Family Medicine

## 2021-06-16 DIAGNOSIS — R1031 Right lower quadrant pain: Secondary | ICD-10-CM | POA: Diagnosis not present

## 2021-06-18 ENCOUNTER — Other Ambulatory Visit (HOSPITAL_COMMUNITY): Payer: Self-pay

## 2021-06-18 MED ORDER — AMOXICILLIN 500 MG PO CAPS
ORAL_CAPSULE | ORAL | 0 refills | Status: DC
  Filled 2021-06-18: qty 21, 7d supply, fill #0

## 2021-06-21 DIAGNOSIS — F411 Generalized anxiety disorder: Secondary | ICD-10-CM | POA: Diagnosis not present

## 2021-06-24 ENCOUNTER — Other Ambulatory Visit (HOSPITAL_BASED_OUTPATIENT_CLINIC_OR_DEPARTMENT_OTHER): Payer: Self-pay | Admitting: Family Medicine

## 2021-06-24 ENCOUNTER — Other Ambulatory Visit (HOSPITAL_COMMUNITY): Payer: Self-pay | Admitting: Family Medicine

## 2021-06-24 ENCOUNTER — Other Ambulatory Visit: Payer: Self-pay | Admitting: Family Medicine

## 2021-06-24 DIAGNOSIS — R1031 Right lower quadrant pain: Secondary | ICD-10-CM

## 2021-06-26 ENCOUNTER — Other Ambulatory Visit: Payer: Self-pay

## 2021-06-26 ENCOUNTER — Encounter (HOSPITAL_BASED_OUTPATIENT_CLINIC_OR_DEPARTMENT_OTHER): Payer: Self-pay

## 2021-06-26 ENCOUNTER — Ambulatory Visit (HOSPITAL_BASED_OUTPATIENT_CLINIC_OR_DEPARTMENT_OTHER)
Admission: RE | Admit: 2021-06-26 | Discharge: 2021-06-26 | Disposition: A | Discharge status: Home / Self Care | Payer: 59 | Source: Ambulatory Visit | Attending: Family Medicine | Admitting: Family Medicine

## 2021-06-26 DIAGNOSIS — R1031 Right lower quadrant pain: Secondary | ICD-10-CM | POA: Insufficient documentation

## 2021-06-26 DIAGNOSIS — R109 Unspecified abdominal pain: Secondary | ICD-10-CM | POA: Diagnosis not present

## 2021-06-26 MED ORDER — IOHEXOL 300 MG/ML  SOLN
100.0000 mL | Freq: Once | INTRAMUSCULAR | Status: AC | PRN
  Administered 2021-06-26: 100 mL via INTRAVENOUS

## 2021-07-03 ENCOUNTER — Other Ambulatory Visit (HOSPITAL_COMMUNITY): Payer: Self-pay

## 2021-07-03 MED ORDER — CARESTART COVID-19 HOME TEST VI KIT
PACK | 0 refills | Status: DC
  Filled 2021-07-03 – 2021-07-13 (×2): qty 4, 4d supply, fill #0

## 2021-07-08 DIAGNOSIS — G8929 Other chronic pain: Secondary | ICD-10-CM | POA: Diagnosis not present

## 2021-07-08 DIAGNOSIS — R350 Frequency of micturition: Secondary | ICD-10-CM | POA: Diagnosis not present

## 2021-07-08 DIAGNOSIS — N812 Incomplete uterovaginal prolapse: Secondary | ICD-10-CM | POA: Diagnosis not present

## 2021-07-08 DIAGNOSIS — N9489 Other specified conditions associated with female genital organs and menstrual cycle: Secondary | ICD-10-CM | POA: Diagnosis not present

## 2021-07-08 DIAGNOSIS — R1031 Right lower quadrant pain: Secondary | ICD-10-CM | POA: Diagnosis not present

## 2021-07-12 ENCOUNTER — Other Ambulatory Visit (HOSPITAL_COMMUNITY): Payer: Self-pay

## 2021-07-13 ENCOUNTER — Other Ambulatory Visit (HOSPITAL_COMMUNITY): Payer: Self-pay

## 2021-07-19 ENCOUNTER — Other Ambulatory Visit (HOSPITAL_COMMUNITY): Payer: Self-pay

## 2021-07-20 ENCOUNTER — Encounter: Payer: Self-pay | Admitting: Obstetrics & Gynecology

## 2021-07-20 ENCOUNTER — Ambulatory Visit (INDEPENDENT_AMBULATORY_CARE_PROVIDER_SITE_OTHER): Payer: 59 | Admitting: Obstetrics & Gynecology

## 2021-07-20 ENCOUNTER — Telehealth: Payer: Self-pay | Admitting: *Deleted

## 2021-07-20 ENCOUNTER — Other Ambulatory Visit: Payer: Self-pay

## 2021-07-20 VITALS — BP 120/80 | HR 70 | Temp 98.3°F | Resp 16

## 2021-07-20 DIAGNOSIS — K4090 Unilateral inguinal hernia, without obstruction or gangrene, not specified as recurrent: Secondary | ICD-10-CM

## 2021-07-20 DIAGNOSIS — R102 Pelvic and perineal pain unspecified side: Secondary | ICD-10-CM

## 2021-07-20 LAB — URINALYSIS, COMPLETE W/RFL CULTURE
Bacteria, UA: NONE SEEN /HPF
Bilirubin Urine: NEGATIVE
Glucose, UA: NEGATIVE
Hgb urine dipstick: NEGATIVE
Hyaline Cast: NONE SEEN /LPF
Ketones, ur: NEGATIVE
Leukocyte Esterase: NEGATIVE
Nitrites, Initial: NEGATIVE
Protein, ur: NEGATIVE
RBC / HPF: NONE SEEN /HPF (ref 0–2)
Specific Gravity, Urine: 1.01 (ref 1.001–1.035)
WBC, UA: NONE SEEN /HPF (ref 0–5)
pH: 6.5 (ref 5.0–8.0)

## 2021-07-20 LAB — NO CULTURE INDICATED

## 2021-07-20 NOTE — Progress Notes (Signed)
? ? ?  ENZA SHONE [birth year] [id number] ? ? ?     51 y.o.  [id number] Married ? ?RP: Rt pelvic pain intermittently when physically active ? ?HPI: Rt pelvic pain intermittently when patient is physically active x a few months.  Worse when pushing for a BM, getting up, lifting, taking a shower.  No pain when laying down in bed.  CT of abdo-pelvis Neg on 06/26/21.  Urine/BMs normal.  No fever. ? ? ?OB History  ?Gravida Para Term Preterm AB Living  ?'3 2     1 2  '$ ?SAB IAB Ectopic Multiple Live Births  ?1          ?  ?# Outcome Date GA Lbr Len/2nd Weight Sex Delivery Anes PTL Lv  ?3 SAB           ?2 Para           ?1 Para           ? ? ?Past medical history,surgical history, problem list, medications, allergies, family history and social history were all reviewed and documented in the EPIC chart. ? ? ?Directed ROS with pertinent positives and negatives documented in the history of present illness/assessment and plan. ? ?Exam: ? ?Vitals:  ? 07/20/21 0927  ?BP: 120/80  ?Pulse: 70  ?Resp: 16  ?Temp: 98.3 ?F (36.8 ?C)  ?TempSrc: Oral  ? ?General appearance:  Normal ? ?Abdomen: Exam in decubitus dorsal with head lifting.  Bulging felt with tenderness at the right lower abdominal wall.  Bulging reducible.  C/W a Rt direct inguinal hernia. ? ?Gynecologic exam: Vulva normal.  Bimanual exam:  Uterus AV, normal volume, mobile, NT.  No adnexal mass felt, NT vaginally. ? ?U/A completely negative ? ?CT abdo-pelvis 06/26/21:  ?Stomach/Bowel: Radiopaque enteric contrast material traverses the ?rectum. Tiny hiatal hernia otherwise the stomach is unremarkable for ?degree of distension. No pathologic dilation of small or large ?bowel. The appendix and terminal ileum appear normal. Moderate ?volume of formed stool throughout the colon suggestive constipation. ?No evidence of acute bowel inflammation. ?  ?Vascular/Lymphatic: Normal caliber abdominal aorta. No ?pathologically enlarged abdominal or pelvic lymph nodes. ?  ?Reproductive: Uterus and  bilateral adnexa are unremarkable. ?  ?Other: No abdominal wall hernia or abnormality. No abdominopelvic ?ascites. ?  ?Musculoskeletal: No acute or significant osseous findings. ?  ?IMPRESSION: ?1. No acute abdominopelvic findings, specifically no abdominal wall ?hernia visualized. If continued concern for inguinal hernia consider ?dedicated ultrasound with provocative maneuvers. ?2. Moderate volume of formed stool throughout the colon suggestive ?of constipation. ?  ? ? ?Assessment/Plan:  51 y.o. [id number]  ? ?1. Pelvic pain ?Rt pelvic pain intermittently when patient is physically active x a few months.  Worse when pushing for a BM, getting up, lifting, taking a shower.  No pain when laying down in bed.  CT of abdo-pelvis Neg on 06/26/21.  Urine/BMs normal.  No fever.  U/A Negative.  On physical exam, c/w a Rt direct inguinal hernia, no incarceration.  Dx and precautions reviewed with patient.  Refer to General Surgery to assess and manage. ?- Urinalysis,Complete w/RFL Culture ? ?2. Direct inguinal hernia of right side ?Dx and precautions discussed.  Reassured that no incarceration is present at this time.  Refer to General Surgery assessment and management. ? ?Other orders ?- FLUTICASONE PROPIONATE, NASAL, NA ?- REFLEXIVE URINE CULTURE  ? ?[name] MD, 11:47 AM 07/20/2021 ? ? ? ?  ?

## 2021-07-20 NOTE — Telephone Encounter (Signed)
Patient scheduled with Dr Donne Hazel on 08/10/21 per her request.   ?

## 2021-07-20 NOTE — Telephone Encounter (Signed)
Message sent to referral coordinator at [facility].   ?

## 2021-07-20 NOTE — Telephone Encounter (Signed)
-----   Message from [name], MD sent at 07/20/2021 12:00 PM EST ----- ?Regarding: Refer to General Surgery ?Rt pelvic pain intermittently when patient is physically active x a few months.  Worse when pushing for a BM, getting up, lifting, taking a shower.  No pain when laying down in bed.  CT of abdo-pelvis Neg on 06/26/21.  Urine/BMs normal.  No fever.  U/A Negative.  On physical exam, c/w a Rt direct inguinal hernia, no incarceration.  Dx and precautions reviewed with patient.  Refer to General Surgery to assess and manage. ? ? ?

## 2021-07-21 ENCOUNTER — Encounter (INDEPENDENT_AMBULATORY_CARE_PROVIDER_SITE_OTHER): Payer: 59 | Admitting: Ophthalmology

## 2021-07-21 DIAGNOSIS — H33301 Unspecified retinal break, right eye: Secondary | ICD-10-CM

## 2021-07-21 DIAGNOSIS — H338 Other retinal detachments: Secondary | ICD-10-CM

## 2021-07-21 DIAGNOSIS — H43813 Vitreous degeneration, bilateral: Secondary | ICD-10-CM | POA: Diagnosis not present

## 2021-07-22 ENCOUNTER — Other Ambulatory Visit (HOSPITAL_COMMUNITY): Payer: Self-pay

## 2021-07-23 ENCOUNTER — Other Ambulatory Visit (HOSPITAL_COMMUNITY): Payer: Self-pay

## 2021-07-23 MED ORDER — VALACYCLOVIR HCL 500 MG PO TABS
ORAL_TABLET | ORAL | 6 refills | Status: DC
  Filled 2021-07-23: qty 30, 30d supply, fill #0

## 2021-07-28 ENCOUNTER — Encounter (INDEPENDENT_AMBULATORY_CARE_PROVIDER_SITE_OTHER): Payer: 59 | Admitting: Ophthalmology

## 2021-08-10 DIAGNOSIS — R1031 Right lower quadrant pain: Secondary | ICD-10-CM | POA: Diagnosis not present

## 2021-08-17 ENCOUNTER — Ambulatory Visit: Payer: 59 | Admitting: Dietician

## 2021-08-19 DIAGNOSIS — F411 Generalized anxiety disorder: Secondary | ICD-10-CM | POA: Diagnosis not present

## 2021-08-27 ENCOUNTER — Encounter: Payer: Self-pay | Admitting: Physical Therapy

## 2021-08-27 ENCOUNTER — Ambulatory Visit: Payer: 59 | Attending: General Surgery | Admitting: Physical Therapy

## 2021-08-27 DIAGNOSIS — R252 Cramp and spasm: Secondary | ICD-10-CM | POA: Insufficient documentation

## 2021-08-27 DIAGNOSIS — M5459 Other low back pain: Secondary | ICD-10-CM | POA: Diagnosis not present

## 2021-08-27 DIAGNOSIS — R1031 Right lower quadrant pain: Secondary | ICD-10-CM | POA: Diagnosis not present

## 2021-08-27 NOTE — Therapy (Signed)
[facility] ?[facility] [place] ?[place], [place], [address] ?Phone: [phone]   Fax:  [phone] ? ?Patient Details  ?Name: Ashley Marshall ?MRN: [id number] ?Date of Birth: [birth year] ?Referring Provider:  Rolm Bookbinder, MD ? ?Encounter Date: 08/27/2021 ? ?OUTPATIENT PHYSICAL THERAPY FEMALE PELVIC EVALUATION ? ? ?Patient Name: Ashley Marshall ?MRN: [id number] ?DOB:Nov 30, 1970, 51 y.o., female ?Today's Date: 08/27/2021 ? ? PT End of Session - 08/27/21 1039   ? ? Visit Number 1   ? Date for PT Re-Evaluation 11/19/21   ? Authorization Type [facility]   ? Authorization - Visit Number 1   ? Authorization - Number of Visits 25   ? PT Start Time 0800   ? PT Stop Time 0845   ? PT Time Calculation (min) 45 min   ? Activity Tolerance Patient tolerated treatment well;No increased pain   ? Behavior During Therapy [facility] for tasks assessed/performed   ? ?  ?  ? ?  ? ? ?Past Medical History:  ?Diagnosis Date  ? Constipation   ? GERD (gastroesophageal reflux disease)   ? no meds - diet controlled  ? Herpes   ? History of kidney stones   ? passed stones - no surgery required  ? HPV in female   ? Hyperlipidemia   ? diet controlled - no meds  ? Missed ab   ? no surgery required  ? Seasonal allergies   ? SVD (spontaneous vaginal delivery)   ? x 2  ? ?Past Surgical History:  ?Procedure Laterality Date  ? CATARACT EXTRACTION Bilateral   ? COLONOSCOPY    ? x 2 -hx polyps  ? COLPOSCOPY    ? DILATATION & CURETTAGE/HYSTEROSCOPY WITH MYOSURE N/A 11/04/2016  ? Procedure: [place];  Surgeon: [name], MD;  Location: [facility];  Service: Gynecology;  Laterality: N/A;  ? RETINAL DETACHMENT SURGERY Left 2000  ? ROBOTIC ASSISTED LAPAROSCOPIC OVARIAN CYSTECTOMY Left 11/04/2016  ? Procedure: ROBOTIC ASSISTED LAPAROSCOPIC OVARIAN CYSTECTOMY;  Surgeon: [name], MD;  Location: [facility];  Service: Gynecology;  Laterality: Left;  RNFA confirmed on  10/26/16 with chass  ? ROBOTIC ASSISTED SALPINGO OOPHERECTOMY Right 11/04/2016  ? Procedure: ROBOTIC ASSISTED SALPINGO OOPHORECTOMY;  Surgeon: [name], MD;  Location: [facility];  Service: Gynecology;  Laterality: Right;  ? WISDOM TOOTH EXTRACTION    ? ?There are no problems to display for this patient. ? ? ?PCP: Glenis Smoker, MD ? ?REFERRING PROVIDER: Rolm Bookbinder, MD ? ?REFERRING DIAG: R10.31 Right groin pain ? ?THERAPY DIAG:  ?Cramp and spasm ? ?Right inguinal pain ? ?Other low back pain ? ?ONSET DATE: 01/2021 ? ?SUBJECTIVE:                                                                                                                                                                                          ? ?  SUBJECTIVE STATEMENT: ?January started to find treatment for the groin pain.  ?Fluid intake: Yes: drinks plenty of water   ? ?Patient confirms identification and approves PT to assess pelvic floor and treatment Yes ? ? ?PAIN:  ?Are you having pain? Yes ?NPRS scale: 5/10 ?Pain location:  right groin pain ? ?Pain type: aching, dull, and deep ?Pain description: intermittent  ? ?Aggravating factors: movement, strain for bowel movement turn over in bed, sitting for long period of time and stand up will feel the pain consistently ?Relieving factors: weight shift, lay flat on back ? ?PRECAUTIONS: None ? ?WEIGHT BEARING RESTRICTIONS No ? ?FALLS:  ?Has patient fallen in last 6 months? No ? ? ?OCCUPATION: full time nurse ? ?PLOF: Independent ? ?PATIENT GOALS reduce pain and return to her activity and not worry about the pain ? ?PERTINENT HISTORY:  ?Endometriosis, small uterine fibroid, right oophorectomy for endometrioma ? ?BOWEL MOVEMENT ?Pain with bowel movement: Yes ?Type of bowel movement:Type (Bristol Stool Scale) Type 5 or 6, Frequency 1 per day , and Strain No ?Fully empty rectum: Yes: but CT showed stool in the intestines ?Leakage: No ?Pads: No ?Fiber supplement: Yes:  Miralax ? ?URINATION ?Pain with urination: Yes when bending forward to urinating ?Fully empty bladder: Yes: urinate then relax so more urine will come out ?Stream: Strong and Weak, when weak has to bend forward and pain comes on and has to urinate more frequently during the day ?Urgency: Yes: when having the right groin pain more often ?Frequency: typically every 2-3 hours ?Leakage:  none ?Pads: No ? ?INTERCOURSE ?Pain with intercourse:  no ? ? ?PREGNANCY ?Vaginal deliveries 2 ?Tearing Yes:   first child was forceps, vacuum and grade 3 tear, 2nd child had shoulder dystonia with episitomy ? ?PROLAPSE ?Rectocele history of one ? ? ? ?OBJECTIVE:  ? ?DIAGNOSTIC FINDINGS:  ?Showed no hernia ? ? ?COGNITION: ? Overall cognitive status: Within functional limits for tasks assessed   ?  ?SENSATION: ? Light touch: Appears intact ? Proprioception: Appears intact ? ? ?LUMBAR SPECIAL TESTS:  ?Thomas test: Positive on the right and with ITB ? ? ? ?POSTURE:  ?scoliosis ? ?LUMBARAROM/PROM ? ?A/PROM A/PROM  ?08/27/2021  ?Flexion   ?Extension Decreased by 50% with pain in right groin  ?Right lateral flexion   ?Left lateral flexion   ?Right rotation Decreased by 25% with twinge in right groin  ?Left rotation Decreased by 50%  ? (Blank rows = not tested) ? ? ? ?LE MMT: ? ?MMT Right ?08/27/2021 Left ?08/27/2021  ?Hip extension 4/5 4/5  ?Hip abduction 4+/5 4/5  ? ?PELVIC MMT: ?  ?MMT  ?08/27/2021  ?Vaginal 2/5  ?Internal Anal Sphincter   ?External Anal Sphincter   ?Puborectalis   ?Diastasis Recti   ?(Blank rows = not tested) ? ?      PALPATION: ?  General  left ilium higher than right, increased fascial tightness in the lower abdomen, tenderness located in iliacus and right hip flexor and quads, tightness in the hip adductors,  ?Right lumbar L4-S1 is tight, tightness on the right SI joint ?              External Perineal Exam no tenderness externally ?              ?              Internal Pelvic Floor creased tightness in the right obturator  internist ? ?TONE: ?Increased on the right pelvic floor ? ?PROLAPSE: ?rectocele ? ?TODAY'S  TREATMENT  ?EVAL DRY NEEDLING: ?Dry needling consent given? yes ?Educational handouts provided? yes ?Muscles treated: right rectus quadratus  ?Response from dry needling: elongation of tissue and trigger point response ? ? ? ?PATIENT EDUCATION:  ?Education details: educated patient about dry needling ?Person educated: Patient ?Education method: Explanation and Handouts ?Education comprehension: verbalized understanding ? ? ?HOME EXERCISE PROGRAM: ?None given today ? ?ASSESSMENT: ? ?CLINICAL IMPRESSION: ?Patient is a 51 y.o. female who was seen today for physical therapy evaluation and treatment for right groin pain. Patient has had this pain for 1 year and is intermittent at level 5/10. Her pain is worse with sitting a long period of time the stand up, strain to have a bowel movement, turning over in bed and bending forward to finish urinating. She has increased urinary urgency when the right groin pain is worse.  Lumbar extension, and bilateral rotation is limited. Lumbar extension and right rotation increase right groin pain. Decreased mobility of L3-S1. Decreased movement of right SI joint. She has scoliosis with left ilium higher than the right. She has increased fascial tightness of the lower abdomen and right obturator internist. Pelvic floor strength is 2/5. She has tenderness located in the rectus femoris, along the right round ligament, and anterior right groin. She has weakness in the hip abductors and extensors. Positive Thomas test on the right and tightness in the ITB. Patient would benefit from skilled therapy to improve flexibility of her right hip musculature, and mobility of spine and SI joint to reduce her pain and restore her function.  ? ? ?OBJECTIVE IMPAIRMENTS decreased activity tolerance, decreased coordination, decreased endurance, decreased mobility, decreased ROM, decreased strength, increased fascial  restrictions, increased muscle spasms, impaired tone, and pain.  ? ?ACTIVITY LIMITATIONS driving, occupation, and turning in bed, urinating, and bowel movements .  ? ?PERSONAL FACTORS Fitness and 1 comorbidity: endometriosis, r

## 2021-08-27 NOTE — Patient Instructions (Signed)

## 2021-09-04 DIAGNOSIS — F411 Generalized anxiety disorder: Secondary | ICD-10-CM | POA: Diagnosis not present

## 2021-09-07 ENCOUNTER — Encounter: Payer: Self-pay | Admitting: Physical Therapy

## 2021-09-07 ENCOUNTER — Encounter: Payer: 59 | Attending: General Surgery | Admitting: Physical Therapy

## 2021-09-07 DIAGNOSIS — M6281 Muscle weakness (generalized): Secondary | ICD-10-CM | POA: Diagnosis not present

## 2021-09-07 DIAGNOSIS — R1031 Right lower quadrant pain: Secondary | ICD-10-CM | POA: Diagnosis not present

## 2021-09-07 DIAGNOSIS — M5459 Other low back pain: Secondary | ICD-10-CM

## 2021-09-07 DIAGNOSIS — R278 Other lack of coordination: Secondary | ICD-10-CM | POA: Diagnosis not present

## 2021-09-07 DIAGNOSIS — R252 Cramp and spasm: Secondary | ICD-10-CM

## 2021-09-07 NOTE — Therapy (Signed)
[place] [facility] ?[address], [address] ?[place], [place], [address] ?Phone: [phone]   Fax:  [phone] ? ?Patient Details  ?Name: Ashley Marshall ?MRN: [id number] ?Date of Birth: 05/02/1971 ?Referring Provider:  Rolm Bookbinder, MD ? ?Encounter Date: 09/07/2021 ? ?OUTPATIENT PHYSICAL THERAPY TREATMENT NOTE ? ? ?Patient Name: Ashley Marshall ?MRN: [id number] ?DOB:Jul 26, 1970, 51 y.o., female, female ?Today's Date: 09/07/2021 ? ?PCP: Glenis Smoker, MD ?REFERRING PROVIDER: Rolm Bookbinder, MD ? ?END OF SESSION:  ? PT End of Session - 09/07/21 0831   ? ? Visit Number 2   ? Date for PT Re-Evaluation 11/19/21   ? Authorization Type [facility]   ? Authorization - Visit Number 2   ? Authorization - Number of Visits 25   ? PT Start Time 0830   ? PT Stop Time 9702   ? PT Time Calculation (min) 45 min   ? Activity Tolerance Patient tolerated treatment well;No increased pain   ? Behavior During Therapy [facility] for tasks assessed/performed   ? ?  ?  ? ?  ? ? ?Past Medical History:  ?Diagnosis Date  ? Constipation   ? GERD (gastroesophageal reflux disease)   ? no meds - diet controlled  ? Herpes   ? History of kidney stones   ? passed stones - no surgery required  ? HPV in female   ? Hyperlipidemia   ? diet controlled - no meds  ? Missed ab   ? no surgery required  ? Seasonal allergies   ? SVD (spontaneous vaginal delivery)   ? x 2  ? ?Past Surgical History:  ?Procedure Laterality Date  ? CATARACT EXTRACTION Bilateral   ? COLONOSCOPY    ? x 2 -hx polyps  ? COLPOSCOPY    ? DILATATION & CURETTAGE/HYSTEROSCOPY WITH MYOSURE N/A 11/04/2016  ? Procedure: [place];  Surgeon: [name], MD;  Location: [facility];  Service: Gynecology;  Laterality: N/A;  ? RETINAL DETACHMENT SURGERY Left 2000  ? ROBOTIC ASSISTED LAPAROSCOPIC OVARIAN CYSTECTOMY Left 11/04/2016  ? Procedure: ROBOTIC ASSISTED LAPAROSCOPIC OVARIAN CYSTECTOMY;  Surgeon: [name], MD;  Location: [facility];  Service: Gynecology;  Laterality: Left;  RNFA confirmed on 10/26/16 with chass  ? ROBOTIC ASSISTED SALPINGO OOPHERECTOMY Right 11/04/2016  ? Procedure: ROBOTIC ASSISTED SALPINGO OOPHORECTOMY;  Surgeon: [name], MD;  Location: [facility];  Service: Gynecology;  Laterality: Right;  ? WISDOM TOOTH EXTRACTION    ? ?There are no problems to display for this patient. ? ? ?REFERRING DIAG: R10.31 Right groin pain ? ? ?THERAPY DIAG:  ?Cramp and spasm ? ?Right inguinal pain ? ?Other low back pain ? ?PERTINENT HISTORY: Endometriosis, small uterine fibroid, right oophorectomy for endometrioma ? ?PRECAUTIONS: None ? ?SUBJECTIVE: I felt good after last visit. I was able to walk with less pain. Sit to stand aggravates the pain.  ? ? ?PAIN:  ?Are you having pain? Yes ?NPRS scale: 5/10 ?Pain location: right groin pain ? ?Pain type: aching, dull, and deep ?Pain description: intermittent  ? ?Aggravating factors: movement, strain for bowel movement turn over in bed, sitting for long period of time and stand up will feel the pain consistently ?Relieving factors: weight shift, lay flat on back ?PATIENT GOALS reduce pain and return to her activity and not worry about the pain ?OBJECTIVE: (objective measures completed at initial evaluation unless otherwise dated) ? ?OBJECTIVE:  ? ?DIAGNOSTIC FINDINGS:  ?Showed no hernia ? ? ?COGNITION: ? Overall cognitive status: Within functional  limits for tasks assessed   ?  ?SENSATION: ? Light touch: Appears intact ? Proprioception: Appears intact ? ? ?LUMBAR SPECIAL TESTS:  ?Thomas test: Positive on the right and with ITB ? ? ? ?POSTURE:  ?scoliosis ? ?LUMBARAROM/PROM ? ?A/PROM A/PROM  ?08/27/2021  ?Flexion   ?Extension Decreased by 50% with pain in right groin  ?Right lateral flexion   ?Left lateral flexion   ?Right rotation Decreased by 25% with twinge in right groin  ?Left rotation Decreased by 50%  ? (Blank rows = not tested) ? ? ? ?LE MMT: ? ?MMT  Right ?08/27/2021 Left ?08/27/2021  ?Hip extension 4/5 4/5  ?Hip abduction 4+/5 4/5  ? ?PELVIC MMT: ?  ?MMT  ?08/27/2021  ?Vaginal 2/5  ?Internal Anal Sphincter   ?External Anal Sphincter   ?Puborectalis   ?Diastasis Recti   ?(Blank rows = not tested) ? ?      PALPATION: ?  General  left ilium higher than right, increased fascial tightness in the lower abdomen, tenderness located in iliacus and right hip flexor and quads, tightness in the hip adductors,  ?Right lumbar L4-S1 is tight, tightness on the right SI joint ?              External Perineal Exam no tenderness externally ?              ?              Internal Pelvic Floor creased tightness in the right obturator internist ? ?TONE: ?Increased on the right pelvic floor ? ?PROLAPSE: ?rectocele ? ?TODAY'S TREATMENT  ?09/07/2021 ?Manual: ?Soft tissue mobilization:To assess for dry needling; using the Addaday to the right quadratus, lumbar multifidi, gluteus medius, and piriformis to elongate after dry needling ?Spinal mobilization:to right side of L1-L5 gapping the facets ?DRY NEEDLING: ?Dry needling consent given? yes ?Educational handouts provided? Previously given ?Muscles treated: right quadratus and lumbar multifidi, piriformis, gluteus medius ?Response from dry needling: trigger oint response, elongation of muscles ? ? ?Exercises: ?Stretches/mobility:piriformis stretch in sittng bil. Hold 30 sec.  ?  Hamstring stretch bil. Holding 30 sec ?  Happy baby Sitting 30 sec ?  Quadratus stretch bil. Holding 30 sec ?  1/2 kneel hip flexor stretch bil. Holding 30 sec ?Strengthening: sitting with towel between knees and moving the right tibia forward and backward to decreased tightness in posterior hip capsule ?  ? ?EVAL DRY NEEDLING: ?Dry needling consent given? yes ?Educational handouts provided? yes ?Muscles treated: right rectus quadratus  ?Response from dry needling: elongation of tissue and trigger point response ? ?PATIENT EDUCATION: ?09/07/2021 ?Education details:  Access Code: [email or web address] ?Person educated: Patient ?Education method: Explanation, Demonstration, and Handouts ?Education comprehension: verbalized understanding and returned demonstration ? ? ? ? ?HOME EXERCISE PROGRAM: ?09/07/2021 ?Access Code: [email or web address] ?URL: [email or web address] ?Date: 09/07/2021 ?Prepared by: Earlie Counts ? ?Exercises ?- Seated Piriformis Stretch with Trunk Bend  - 1 x daily - 7 x weekly - 1 sets - 2 reps - 30 sec hold ?- Seated Piriformis Stretch with Trunk Bend (Mirrored)  - 1 x daily - 7 x weekly - 1 sets - 2 reps - 30 sec hold ?- Seated Hamstring Stretch  - 1 x daily - 7 x weekly - 1 sets - 2 reps - 30 sec hold ?- Seated Hamstring Stretch (Mirrored)  - 1 x daily - 7 x weekly - 1 sets - 2 reps - 30 sec hold ?- Seated Happy Baby With Trunk Flexion For Pelvic Relaxation  -  1 x daily - 7 x weekly - 1 sets - 2 reps - 30 sec hold ?- Seated Quadratus Lumborum Stretch in Chair  - 1 x daily - 7 x weekly - 1 sets - 2 reps - 30 sec hold ?- Seated Quadratus Lumborum Stretch in Chair (Mirrored)  - 1 x daily - 7 x weekly - 1 sets - 2 reps - 30 sec hold ?- Half Kneeling Hip Flexor Stretch with Sidebend  - 1 x daily - 7 x weekly - 1 sets - 2 reps - 30 sec hold ?- Half Kneeling Hip Flexor Stretch with Sidebend (Mirrored)  - 1 x daily - 7 x weekly - 1 sets - 2 reps - 30 sec hold ?ASSESSMENT: ? ?CLINICAL IMPRESSION: ?Patient is a 51 y.o. female who was seen today for physical therapy evaluation and treatment for right groin pain. Patient has increased tightness in the L5-S1 facet. She had multiple trigger points that responded well with dry needling. Patient had no pain after the manual work. She has stretches to continue to elongate the muscles. Patient would benefit from skilled therapy to improve flexibility of her right hip musculature, and mobility of spine and SI joint to reduce her pain and restore her function.  ? ? ?OBJECTIVE IMPAIRMENTS decreased activity tolerance, decreased coordination,  decreased endurance, decreased mobility, decreased ROM, decreased strength, increased fascial restrictions, increased muscle spasms, impaired tone, and pain.  ? ?ACTIVITY LIMITATIONS driving, occupation, and turning in

## 2021-09-24 DIAGNOSIS — F411 Generalized anxiety disorder: Secondary | ICD-10-CM | POA: Diagnosis not present

## 2021-10-06 ENCOUNTER — Encounter: Payer: Self-pay | Admitting: Physical Therapy

## 2021-10-06 ENCOUNTER — Ambulatory Visit: Payer: 59 | Attending: General Surgery | Admitting: Physical Therapy

## 2021-10-06 DIAGNOSIS — R1031 Right lower quadrant pain: Secondary | ICD-10-CM | POA: Diagnosis not present

## 2021-10-06 DIAGNOSIS — M5459 Other low back pain: Secondary | ICD-10-CM | POA: Diagnosis not present

## 2021-10-06 DIAGNOSIS — R252 Cramp and spasm: Secondary | ICD-10-CM | POA: Insufficient documentation

## 2021-10-06 NOTE — Therapy (Signed)
OUTPATIENT PHYSICAL THERAPY TREATMENT NOTE   Patient Name: Ashley Marshall MRN: [id number] DOB:[birth year], 51 y.o., female Today's Date: 10/06/2021  PCP: Glenis Smoker, MD REFERRING PROVIDER: Rolm Bookbinder, MD  END OF SESSION:   PT End of Session - 10/06/21 1539     Visit Number 3    Date for PT Re-Evaluation 11/19/21    Authorization Type [facility]    Authorization - Visit Number 3    Authorization - Number of Visits 25    PT Start Time 1975    PT Stop Time 1610    PT Time Calculation (min) 40 min    Activity Tolerance Patient tolerated treatment well;No increased pain    Behavior During Therapy WFL for tasks assessed/performed             Past Medical History:  Diagnosis Date   Constipation    GERD (gastroesophageal reflux disease)    no meds - diet controlled   Herpes    History of kidney stones    passed stones - no surgery required   HPV in female    Hyperlipidemia    diet controlled - no meds   Missed ab    no surgery required   Seasonal allergies    SVD (spontaneous vaginal delivery)    x 2   Past Surgical History:  Procedure Laterality Date   CATARACT EXTRACTION Bilateral    COLONOSCOPY     x 2 -hx polyps   COLPOSCOPY     DILATATION & CURETTAGE/HYSTEROSCOPY WITH MYOSURE N/A 11/04/2016   Procedure: DILATATION & CURETTAGE/HYSTEROSCOPY WITH MYOSURE;  Surgeon: [name], MD;  Location: [facility];  Service: Gynecology;  Laterality: N/A;   RETINAL DETACHMENT SURGERY Left 2000   ROBOTIC ASSISTED LAPAROSCOPIC OVARIAN CYSTECTOMY Left 11/04/2016   Procedure: ROBOTIC ASSISTED LAPAROSCOPIC OVARIAN CYSTECTOMY;  Surgeon: [name], MD;  Location: [facility];  Service: Gynecology;  Laterality: Left;  RNFA confirmed on 10/26/16 with chass   ROBOTIC ASSISTED SALPINGO OOPHERECTOMY Right 11/04/2016   Procedure: ROBOTIC ASSISTED SALPINGO OOPHORECTOMY;  Surgeon: [name], MD;  Location: [facility];  Service: Gynecology;  Laterality: Right;    WISDOM TOOTH EXTRACTION     There are no problems to display for this patient.   REFERRING DIAG: R10.31 Right groin pain  THERAPY DIAG:  Cramp and spasm  Right inguinal pain  Other low back pain  Rationale for Evaluation and Treatment Rehabilitation  PERTINENT HISTORY: Endometriosis, small uterine fibroid, right oophorectomy for endometrioma  PRECAUTIONS: None  SUBJECTIVE: I am noticing less groin pain and not an everyday occurrence. The pain improved by 50%.   PAIN:  Are you having pain? Yes NPRS scale: 2/10 Pain location: right groin pain  Pain type: aching, dull, and deep Pain description: intermittent   Aggravating factors: movement, strain for bowel movement turn over in bed, sitting for long period of time and stand up will feel the pain consistently Relieving factors: weight shift, lay flat on back  PATIENT GOALS reduce pain and return to her activity and not worry about the pain  OBJECTIVE: (objective measures completed at initial evaluation unless otherwise dated) OBJECTIVE:    DIAGNOSTIC FINDINGS:  Showed no hernia     COGNITION:            Overall cognitive status: Within functional limits for tasks assessed                          SENSATION:  Light touch: Appears intact            Proprioception: Appears intact     LUMBAR SPECIAL TESTS:  Thomas test: Positive on the right and with ITB       POSTURE:  scoliosis   LUMBARAROM/PROM   A/PROM A/PROM  08/27/2021  Flexion    Extension Decreased by 50% with pain in right groin  Right lateral flexion    Left lateral flexion    Right rotation Decreased by 25% with twinge in right groin  Left rotation Decreased by 50%   (Blank rows = not tested)       LE MMT:   MMT Right 08/27/2021 Left 08/27/2021  Hip extension 4/5 4/5  Hip abduction 4+/5 4/5    PELVIC MMT:   MMT   08/27/2021  Vaginal 2/5  (Blank rows = not tested)         PALPATION:   General  left ilium higher than  right, increased fascial tightness in the lower abdomen, tenderness located in iliacus and right hip flexor and quads, tightness in the hip adductors,  Right lumbar L4-S1 is tight, tightness on the right SI joint               External Perineal Exam no tenderness externally                             Internal Pelvic Floor increased tightness in the right obturator internist   TONE: Increased on the right pelvic floor   PROLAPSE: rectocele   TODAY'S TREATMENT  10/06/2021 Manual: Soft tissue mobilization:to right quadricep, quadratus, and psoas to elongate after dry needling.  Myofascial release: tissue rolling of the right lower quadrant.  Trigger Point Dry-Needling  Treatment instructions: Expect mild to moderate muscle soreness. S/S of pneumothorax if dry needled over a lung field, and to seek immediate medical attention should they occur. Patient verbalized understanding of these instructions and education.  Patient Consent Given: Yes Education handout provided: Previously provided Muscles treated: right quadratus, right rectus, right vastus lateralis, right psoas Electrical stimulation performed: No Parameters: N/A Treatment response/outcome: trigger point response and elongation of muscles  Exercises: Stretches/mobility: Strengthening:lunges 10x each side with counter support Dead lift 5# each hand x15 with VC to squeeze her shoulder blades, keep spine erect, and cervical in neutral   09/07/2021 Manual: Soft tissue mobilization:To assess for dry needling; using the Addaday to the right quadratus, lumbar multifidi, gluteus medius, and piriformis to elongate after dry needling Spinal mobilization:to right side of L1-L5 gapping the facets DRY NEEDLING: Dry needling consent given? yes Educational handouts provided? Previously given Muscles treated: right quadratus and lumbar multifidi, piriformis, gluteus medius Response from dry needling: trigger oint response, elongation of  muscles     Exercises: Stretches/mobility:piriformis stretch in sittng bil. Hold 30 sec.                          Hamstring stretch bil. Holding 30 sec                         Happy baby Sitting 30 sec                         Quadratus stretch bil. Holding 30 sec  1/2 kneel hip flexor stretch bil. Holding 30 sec Strengthening: sitting with towel between knees and moving the right tibia forward and backward to decreased tightness in posterior hip capsule     EVAL DRY NEEDLING: Dry needling consent given? yes Educational handouts provided? yes Muscles treated: right rectus quadratus  Response from dry needling: elongation of tissue and trigger point response   PATIENT EDUCATION: 10/06/2021 Education details: Access Code: [email or web address] Person educated: Patient Education method: Explanation, Demonstration, and Handouts Education comprehension: verbalized understanding and returned demonstration         HOME EXERCISE PROGRAM: 10/06/2021 Access Code: [email or web address] URL: [email or web address] Date: 10/06/2021 Prepared by: Earlie Counts  Exercises -- Standing Deadlift with Barbell - Knees Straight  - 1 x daily - 3 x weekly - 2 sets - 10 reps - Lunge with Counter Support  - 1 x daily - 3 x weekly - 1 sets - 5-10 reps  ASSESSMENT:   CLINICAL IMPRESSION: Patient is a 51 y.o. female who was seen today for physical therapy evaluation and treatment for right groin pain. Patient reports her pain level is 2/10 compared to 5/10 and is 50% less. Patient is learning exercises to improve hip and core strength. She needed verbal cues with lunges to keep knee from turning inward engage her lower abdominals. She has fascial tightness in the right lower quadrant. Patient would benefit from skilled therapy to improve flexibility of her right hip musculature, and mobility of spine and SI joint to reduce her pain and restore her function.      OBJECTIVE IMPAIRMENTS decreased  activity tolerance, decreased coordination, decreased endurance, decreased mobility, decreased ROM, decreased strength, increased fascial restrictions, increased muscle spasms, impaired tone, and pain.    ACTIVITY LIMITATIONS driving, occupation, and turning in bed, urinating, and bowel movements.    PERSONAL FACTORS Fitness and 1 comorbidity: endometriosis, removal of right oophorectomy for endometrioma are also affecting patient's functional outcome.      REHAB POTENTIAL: Excellent   CLINICAL DECISION MAKING: Stable/uncomplicated   EVALUATION COMPLEXITY: Low MD signed initial note.      GOALS: Goals reviewed with patient? Yes   SHORT TERM GOALS: Target date: 09/24/2021   Patient is independent with stretches to elongate the tight muscles.  Goal status: Met   2.  Patient reports her pain decreased >/= 25% due to reduction of trigger points in the muscles and increased spinal mobility.  Goal status: Met     LONG TERM GOALS: Target date: 11/19/2021   Patient is independent with advanced HEP to reduce her pain.  Baseline:  Goal status: INITIAL   2.  Patient pelvic floor strength is >/= 3/5 to improve coordination and reduce pain with urination and bowel movements.  Baseline:  Goal status: INITIAL   3.  Patient has full lumbar ROM without pain due to improved mobility.  Baseline:  Goal status: INITIAL   4.  Patient is able to sit for a long period of time and get up with minimal to no pain.  Baseline: Patient can sit with sitffness and able to get up with greater ease.  Goal status: ongoing   5.  Patient is able to walk at her normal pace due to improved mobility.  Baseline: Patient is walking at her normal pace more.  Goal status: ongoing.      PLAN: PT FREQUENCY: 1x/week   PT DURATION: 12 weeks   PLANNED INTERVENTIONS: Therapeutic exercises, Therapeutic activity, Neuromuscular re-education, Patient/Family education, Joint mobilization, Dry Needling, Electrical  stimulation, Spinal  mobilization, Cryotherapy, Moist heat, Taping, Ultrasound, and Manual therapy   PLAN FOR NEXT SESSION: dry needling to right lumbar and rectus; manual work to the same, spinal mobilization to L5-S1and SI joint mobilization; half kneel with pulling on band, walking sideways for gluteus medius    Earlie Counts, PT 10/06/21 4:35 PM

## 2021-10-07 DIAGNOSIS — J343 Hypertrophy of nasal turbinates: Secondary | ICD-10-CM | POA: Diagnosis not present

## 2021-10-07 DIAGNOSIS — J342 Deviated nasal septum: Secondary | ICD-10-CM | POA: Diagnosis not present

## 2021-10-07 DIAGNOSIS — J310 Chronic rhinitis: Secondary | ICD-10-CM | POA: Diagnosis not present

## 2021-10-13 ENCOUNTER — Encounter: Payer: Self-pay | Admitting: Physical Therapy

## 2021-10-13 ENCOUNTER — Ambulatory Visit: Payer: 59 | Admitting: Physical Therapy

## 2021-10-13 DIAGNOSIS — M5459 Other low back pain: Secondary | ICD-10-CM

## 2021-10-13 DIAGNOSIS — R252 Cramp and spasm: Secondary | ICD-10-CM

## 2021-10-13 DIAGNOSIS — R1031 Right lower quadrant pain: Secondary | ICD-10-CM | POA: Diagnosis not present

## 2021-10-13 NOTE — Therapy (Signed)
OUTPATIENT PHYSICAL THERAPY TREATMENT NOTE   Patient Name: Ashley Marshall MRN: [id number] DOB:Nov 01, 1970, 51 y.o., female Today's Date: 10/13/2021  PCP:  Glenis Smoker, MD REFERRING PROVIDER: Rolm Bookbinder, MD  END OF SESSION:   PT End of Session - 10/13/21 1619     Visit Number 4    Date for PT Re-Evaluation 11/19/21    Authorization Type [facility]    Authorization - Visit Number 4    Authorization - Number of Visits 25    PT Start Time 6948    PT Stop Time 1655    PT Time Calculation (min) 40 min    Activity Tolerance Patient tolerated treatment well;No increased pain    Behavior During Therapy WFL for tasks assessed/performed             Past Medical History:  Diagnosis Date   Constipation    GERD (gastroesophageal reflux disease)    no meds - diet controlled   Herpes    History of kidney stones    passed stones - no surgery required   HPV in female    Hyperlipidemia    diet controlled - no meds   Missed ab    no surgery required   Seasonal allergies    SVD (spontaneous vaginal delivery)    x 2   Past Surgical History:  Procedure Laterality Date   CATARACT EXTRACTION Bilateral    COLONOSCOPY     x 2 -hx polyps   COLPOSCOPY     DILATATION & CURETTAGE/HYSTEROSCOPY WITH MYOSURE N/A 11/04/2016   Procedure: DILATATION & CURETTAGE/HYSTEROSCOPY WITH MYOSURE;  Surgeon: [name], MD;  Location: [facility];  Service: Gynecology;  Laterality: N/A;   RETINAL DETACHMENT SURGERY Left 2000   ROBOTIC ASSISTED LAPAROSCOPIC OVARIAN CYSTECTOMY Left 11/04/2016   Procedure: ROBOTIC ASSISTED LAPAROSCOPIC OVARIAN CYSTECTOMY;  Surgeon: [name], MD;  Location: [facility];  Service: Gynecology;  Laterality: Left;  RNFA confirmed on 10/26/16 with chass   ROBOTIC ASSISTED SALPINGO OOPHERECTOMY Right 11/04/2016   Procedure: ROBOTIC ASSISTED SALPINGO OOPHORECTOMY;  Surgeon: [name], MD;  Location: [facility];  Service: Gynecology;  Laterality: Right;    WISDOM TOOTH EXTRACTION     There are no problems to display for this patient.   REFERRING DIAG: R10.31 Right groin pain  THERAPY DIAG:  Cramp and spasm  Right inguinal pain  Other low back pain  Rationale for Evaluation and Treatment Rehabilitation  PERTINENT HISTORY: Endometriosis, small uterine fibroid, right oophorectomy for endometrioma  PRECAUTIONS: None  SUBJECTIVE: I had increase in pain over the weekend.    PAIN:  Are you having pain? Yes NPRS scale: 7/10 Pain location: right groin pain   Pain type: aching, dull, and deep Pain description: intermittent    Aggravating factors: movement, strain for bowel movement turn over in bed, sitting for long period of time and stand up will feel the pain consistently Relieving factors: weight shift, lay flat on back   PATIENT GOALS reduce pain and return to her activity and not worry about the pain  OBJECTIVE: (objective measures completed at initial evaluation unless otherwise dated)  OBJECTIVE:    DIAGNOSTIC FINDINGS:  Showed no hernia     COGNITION:            Overall cognitive status: Within functional limits for tasks assessed                          SENSATION:  Light touch: Appears intact            Proprioception: Appears intact     LUMBAR SPECIAL TESTS:  Thomas test: Positive on the right and with ITB       POSTURE:  scoliosis   LUMBARAROM/PROM   A/PROM A/PROM  08/27/2021  Extension Decreased by 50% with pain in right groin  Right rotation Decreased by 25% with twinge in right groin  Left rotation Decreased by 50%   (Blank rows = not tested)       LE MMT:   MMT Right 08/27/2021 Left 08/27/2021  Hip extension 4/5 4/5  Hip abduction 4+/5 4/5    PELVIC MMT:   MMT   08/27/2021  Vaginal 2/5  (Blank rows = not tested)         PALPATION:   General  left ilium higher than right, increased fascial tightness in the lower abdomen, tenderness located in iliacus and right hip flexor  and quads, tightness in the hip adductors,  Right lumbar L4-S1 is tight, tightness on the right SI joint 10/13/2021: tenderness located in the inguinal canal and increase pain when tighten the lower abdominals.                External Perineal Exam no tenderness externally                             Internal Pelvic Floor increased tightness in the right obturator internist   TONE: Increased on the right pelvic floor   PROLAPSE: rectocele   TODAY'S TREATMENT  10/13/2021 Manual: Soft tissue mobilization:to assess for dry needling. Manual work to the lumbar paraspinals in prone, along the right lower quadrant on left sidely working on the right rectus, psoas, and inguinal ligament Spinal mobilization:sidel glide to L1-L5 grade 3 on both sides Dry needling:Trigger Point Dry-Needling  Treatment instructions: Expect mild to moderate muscle soreness. S/S of pneumothorax if dry needled over a lung field, and to seek immediate medical attention should they occur. Patient verbalized understanding of these instructions and education.  Patient Consent Given: Yes Education handout provided: Previously provided Muscles treated: lumbar multifidi Electrical stimulation performed: No Parameters: N/A Treatment response/outcome: Trigger point response and elongation of muscles  Exercises: Stretches/mobility:supine rectus stretch with therapist assisting at endrange on the right Strengthening:supine SLR with abdominal contraction 10x each side Tried side plank in sidely on the mat, then the elevated mat then against the wall but had difficulty coordinating her posture and using the abdominals    10/06/2021 Manual: Soft tissue mobilization:to right quadricep, quadratus, and psoas to elongate after dry needling.  Myofascial release: tissue rolling of the right lower quadrant.  Trigger Point Dry-Needling  Treatment instructions: Expect mild to moderate muscle soreness. S/S of pneumothorax if dry  needled over a lung field, and to seek immediate medical attention should they occur. Patient verbalized understanding of these instructions and education.   Patient Consent Given: Yes Education handout provided: Previously provided Muscles treated: right quadratus, right rectus, right vastus lateralis, right psoas Electrical stimulation performed: No Parameters: N/A Treatment response/outcome: trigger point response and elongation of muscles   Exercises: Stretches/mobility: Strengthening:lunges 10x each side with counter support Dead lift 5# each hand x15 with VC to squeeze her shoulder blades, keep spine erect, and cervical in neutral   09/07/2021 Manual: Soft tissue mobilization:To assess for dry needling; using the [place] to the right quadratus, lumbar multifidi, gluteus medius, and piriformis to elongate  after dry needling Spinal mobilization:to right side of L1-L5 gapping the facets DRY NEEDLING: Dry needling consent given? yes Educational handouts provided? Previously given Muscles treated: right quadratus and lumbar multifidi, piriformis, gluteus medius Response from dry needling: trigger oint response, elongation of muscles     Exercises: Stretches/mobility:piriformis stretch in sittng bil. Hold 30 sec.                          Hamstring stretch bil. Holding 30 sec                         Happy baby Sitting 30 sec                         Quadratus stretch bil. Holding 30 sec                         1/2 kneel hip flexor stretch bil. Holding 30 sec Strengthening: sitting with towel between knees and moving the right tibia forward and backward to decreased tightness in posterior hip capsule       PATIENT EDUCATION: 10/06/2021 Education details: Access Code: [email or web address] Person educated: Patient Education method: Explanation, Demonstration, and Handouts Education comprehension: verbalized understanding and returned demonstration         HOME EXERCISE  PROGRAM: 10/13/2021 Access Code: [email or web address] URL: [email or web address] Date: 10/13/2021 Prepared by: Earlie Counts  Exercises - Supine Pelvic Tilt with Straight Leg Raise  - 1 x daily - 3 x weekly - 1 sets - 10 reps  ASSESSMENT:   CLINICAL IMPRESSION: Patient is a 51 y.o. female who was seen today for physical therapy evaluation and treatment for right groin pain. Patient pelvis is in correct alignment after manual work. She did not have tenderness in the lower quadrant on the right after manual work. Patient is able to stabilize with lower abdominal contraction to do SLR. Patient had increased pain during the weekend due to a flare-up. Patient had better mobility of the lumbar vertebrae after manual work.  Patient would benefit from skilled therapy to improve flexibility of her right hip musculature, and mobility of spine and SI joint to reduce her pain and restore her function.      OBJECTIVE IMPAIRMENTS decreased activity tolerance, decreased coordination, decreased endurance, decreased mobility, decreased ROM, decreased strength, increased fascial restrictions, increased muscle spasms, impaired tone, and pain.    ACTIVITY LIMITATIONS driving, occupation, and turning in bed, urinating, and bowel movements.    PERSONAL FACTORS Fitness and 1 comorbidity: endometriosis, removal of right oophorectomy for endometrioma are also affecting patient's functional outcome.      REHAB POTENTIAL: Excellent   CLINICAL DECISION MAKING: Stable/uncomplicated   EVALUATION COMPLEXITY: Low MD signed initial note.      GOALS: Goals reviewed with patient? Yes   SHORT TERM GOALS: Target date: 09/24/2021   Patient is independent with stretches to elongate the tight muscles.  Goal status: Met   2.  Patient reports her pain decreased >/= 25% due to reduction of trigger points in the muscles and increased spinal mobility.  Goal status: Met     LONG TERM GOALS: Target date: 11/19/2021    Patient is independent with advanced HEP to reduce her pain.  Baseline:  Goal status: INITIAL   2.  Patient pelvic floor strength is >/= 3/5 to improve coordination and reduce pain with urination and bowel movements.  Baseline:  Goal status: INITIAL   3.  Patient has full lumbar ROM without pain due to improved mobility.  Baseline:  Goal status: INITIAL   4.  Patient is able to sit for a long period of time and get up with minimal to no pain.  Baseline: Patient can sit with sitffness and able to get up with greater ease.  Goal status: ongoing   5.  Patient is able to walk at her normal pace due to improved mobility.  Baseline: Patient is walking at her normal pace more.  Goal status: ongoing.      PLAN: PT FREQUENCY: 1x/week   PT DURATION: 12 weeks   PLANNED INTERVENTIONS: Therapeutic exercises, Therapeutic activity, Neuromuscular re-education, Patient/Family education, Joint mobilization, Dry Needling, Electrical stimulation, Spinal mobilization, Cryotherapy, Moist heat, Taping, Ultrasound, and Manual therapy   PLAN FOR NEXT SESSION: dry needling if needed;  spinal mobilization to L5-S1and SI joint mobilization; half kneel with pulling on band, walking sideways for gluteus medius        Earlie Counts, PT 10/13/21 5:06 PM

## 2021-10-18 ENCOUNTER — Ambulatory Visit: Payer: 59 | Admitting: Physical Therapy

## 2021-10-19 DIAGNOSIS — F411 Generalized anxiety disorder: Secondary | ICD-10-CM | POA: Diagnosis not present

## 2021-10-20 ENCOUNTER — Other Ambulatory Visit (HOSPITAL_COMMUNITY): Payer: Self-pay

## 2021-10-22 ENCOUNTER — Ambulatory Visit (INDEPENDENT_AMBULATORY_CARE_PROVIDER_SITE_OTHER): Payer: 59 | Admitting: Obstetrics & Gynecology

## 2021-10-22 ENCOUNTER — Encounter: Payer: Self-pay | Admitting: Obstetrics & Gynecology

## 2021-10-22 VITALS — BP 110/80 | HR 76 | Ht 62.25 in | Wt 144.0 lb

## 2021-10-22 DIAGNOSIS — Z01419 Encounter for gynecological examination (general) (routine) without abnormal findings: Secondary | ICD-10-CM

## 2021-10-22 DIAGNOSIS — Z780 Asymptomatic menopausal state: Secondary | ICD-10-CM | POA: Diagnosis not present

## 2021-10-22 NOTE — Progress Notes (Signed)
Ashley Marshall [birth year] [id number]   History:    51 y.o. G3P2A1L2 Married.  Vasectomy.  Works at [employer] center.  Daughter 39, son 57.   RP:  Established patient presenting for annual gyn exam    HPI: Postmenopause x 3 years, well on no HRT.  No PMB.  No pelvic pain.  Rarely sexually active, no pain with IC.  Pap Neg 10/2020.  No h/o abnormal Pap.  Paps every 3 yrs.  Breasts normal. Screening mammo neg 04/2021. BMI 26.13. Walking for fitness.  Seen by Dr Rodman Key Urogynecology: Pelvic floor dysfunction, did PT and vaginal Valium, possible mild IC.  Cysto-rectocele with mild stable symptoms, prefers observation.  Currently doing PT for a Rt lower abdominal wall pain.  Health labs with [facility] neg 2021.  Past medical history,surgical history, family history and social history were all reviewed and documented in the EPIC chart.  Gynecologic History Patient's last menstrual period was 09/02/2018.  Obstetric History OB History  Gravida Para Term Preterm AB Living  '3 2     1 2  '$ SAB IAB Ectopic Multiple Live Births  1            # Outcome Date GA Lbr Len/2nd Weight Sex Delivery Anes PTL Lv  3 SAB           2 Para           1 Para              ROS: A ROS was performed and pertinent positives and negatives are included in the history.  GENERAL: No fevers or chills. HEENT: No change in vision, no earache, sore throat or sinus congestion. NECK: No pain or stiffness. CARDIOVASCULAR: No chest pain or pressure. No palpitations. PULMONARY: No shortness of breath, cough or wheeze. GASTROINTESTINAL: No abdominal pain, nausea, vomiting or diarrhea, melena or bright red blood per rectum. GENITOURINARY: No urinary frequency, urgency, hesitancy or dysuria. MUSCULOSKELETAL: No joint or muscle pain, no back pain, no recent trauma. DERMATOLOGIC: No rash, no itching, no lesions. ENDOCRINE: No polyuria, polydipsia, no heat or cold intolerance. No recent change in weight. HEMATOLOGICAL: No anemia  or easy bruising or bleeding. NEUROLOGIC: No headache, seizures, numbness, tingling or weakness. PSYCHIATRIC: No depression, no loss of interest in normal activity or change in sleep pattern.     Exam:   BP 110/80   Pulse 76   Ht 5' 2.25" (1.581 m)   Wt 144 lb (65.3 kg)   LMP 09/02/2018   SpO2 95%   BMI 26.13 kg/m   Body mass index is 26.13 kg/m.  General appearance : Well developed well nourished female. No acute distress HEENT: Eyes: no retinal hemorrhage or exudates,  Neck supple, trachea midline, no carotid bruits, no thyroidmegaly Lungs: Clear to auscultation, no rhonchi or wheezes, or rib retractions  Heart: Regular rate and rhythm, no murmurs or gallops Breast:Examined in sitting and supine position were symmetrical in appearance, no palpable masses or tenderness,  no skin retraction, no nipple inversion, no nipple discharge, no skin discoloration, no axillary or supraclavicular lymphadenopathy Abdomen: no palpable masses or tenderness, no rebound or guarding Extremities: no edema or skin discoloration or tenderness  Pelvic: Vulva: Normal             Vagina: No gross lesions or discharge  Cervix: No gross lesions or discharge  Uterus  RV, normal size, shape and consistency, non-tender and mobile  Adnexa  Without masses or tenderness  Anus: Normal   Assessment/Plan:  51 y.o. female for annual exam   1. Well female exam with routine gynecological exam Postmenopause x 3 years, well on no HRT.  No PMB.  No pelvic pain.  Rarely sexually active, no pain with IC.  Pap Neg 10/2020.  No h/o abnormal Pap.  Paps every 3 yrs.  Breasts normal. Screening mammo neg 04/2021. BMI 26.13. Walking for fitness.  Seen by Dr Rodman Key Urogynecology: Pelvic floor dysfunction, did PT and vaginal Valium, possible mild IC.  Cysto-rectocele with mild stable symptoms, prefers observation.  Currently doing PT for a Rt lower abdominal wall pain.  Health labs with Fam MD. [facility] neg 2021.  2.  Postmenopause Postmenopause x 3 years, well on no HRT.  No PMB.  No pelvic pain.  Rarely sexually active, no pain with IC.  Vit D, Ca++ total 1.5 g/d, weight bearing activities.  Other orders - polyethylene glycol (MIRALAX / GLYCOLAX) 17 g packet; Take 17 g by mouth daily. - MAGNESIUM PO; Take by mouth.   [name] MD, 8:19 AM 10/22/2021

## 2021-10-25 ENCOUNTER — Ambulatory Visit: Payer: 59 | Attending: General Surgery | Admitting: Physical Therapy

## 2021-10-25 ENCOUNTER — Encounter: Payer: Self-pay | Admitting: Physical Therapy

## 2021-10-25 DIAGNOSIS — M5459 Other low back pain: Secondary | ICD-10-CM | POA: Diagnosis not present

## 2021-10-25 DIAGNOSIS — R1031 Right lower quadrant pain: Secondary | ICD-10-CM | POA: Insufficient documentation

## 2021-10-25 DIAGNOSIS — R252 Cramp and spasm: Secondary | ICD-10-CM | POA: Diagnosis not present

## 2021-10-25 NOTE — Therapy (Signed)
OUTPATIENT PHYSICAL THERAPY TREATMENT NOTE   Patient Name: Ashley Marshall MRN: [id number] DOB:[birth year], 51 y.o., female Today's Date: 10/25/2021  PCP: Glenis Smoker, MD REFERRING PROVIDER: Rolm Bookbinder, MD  END OF SESSION:   PT End of Session - 10/25/21 0803     Visit Number 5    Date for PT Re-Evaluation 11/19/21    Authorization Type [facility]    Authorization - Visit Number 5    Authorization - Number of Visits 25    PT Start Time 0800    PT Stop Time 0840    PT Time Calculation (min) 40 min    Activity Tolerance Patient tolerated treatment well;No increased pain    Behavior During Therapy WFL for tasks assessed/performed             Past Medical History:  Diagnosis Date   Constipation    GERD (gastroesophageal reflux disease)    no meds - diet controlled   Herpes    History of kidney stones    passed stones - no surgery required   HPV in female    Hyperlipidemia    diet controlled - no meds   Missed ab    no surgery required   Seasonal allergies    SVD (spontaneous vaginal delivery)    x 2   Past Surgical History:  Procedure Laterality Date   CATARACT EXTRACTION Bilateral    COLONOSCOPY     x 2 -hx polyps   COLPOSCOPY     DILATATION & CURETTAGE/HYSTEROSCOPY WITH MYOSURE N/A 11/04/2016   Procedure: DILATATION & CURETTAGE/HYSTEROSCOPY WITH MYOSURE;  Surgeon: [name], MD;  Location: [facility];  Service: Gynecology;  Laterality: N/A;   RETINAL DETACHMENT SURGERY Left 2000   ROBOTIC ASSISTED LAPAROSCOPIC OVARIAN CYSTECTOMY Left 11/04/2016   Procedure: ROBOTIC ASSISTED LAPAROSCOPIC OVARIAN CYSTECTOMY;  Surgeon: [name], MD;  Location: [facility];  Service: Gynecology;  Laterality: Left;  RNFA confirmed on 10/26/16 with chass   ROBOTIC ASSISTED SALPINGO OOPHERECTOMY Right 11/04/2016   Procedure: ROBOTIC ASSISTED SALPINGO OOPHORECTOMY;  Surgeon: [name], MD;  Location: [facility];  Service: Gynecology;  Laterality: Right;    WISDOM TOOTH EXTRACTION     There are no problems to display for this patient.   REFERRING DIAG: R10.31 Right groin pain  THERAPY DIAG:  Cramp and spasm  Right inguinal pain  Other low back pain  Rationale for Evaluation and Treatment Rehabilitation  PERTINENT HISTORY: Endometriosis, small uterine fibroid, right oophorectomy for endometrioma  PRECAUTIONS: None SUBJECTIVE: no increase in pain. Getting 15, 000 steps per day and returning to exercise.    PAIN:  Are you having pain? Yes NPRS scale: 4/10 Pain location: right groin pain   Pain type: aching, dull, and deep, short lasting Pain description: intermittent    Aggravating factors: movement, strain for bowel movement turn over in bed, sitting for long period of time and stand up will feel the pain consistently Relieving factors: weight shift, lay flat on back   PATIENT GOALS reduce pain and return to her activity and not worry about the pain  OBJECTIVE: (objective measures completed at initial evaluation unless otherwise dated) OBJECTIVE:    DIAGNOSTIC FINDINGS:  Showed no hernia     COGNITION:            Overall cognitive status: Within functional limits for tasks assessed                          SENSATION:  Light touch: Appears intact            Proprioception: Appears intact     LUMBAR SPECIAL TESTS:  Thomas test: Positive on the right and with ITB       POSTURE:  scoliosis   LUMBARAROM/PROM   A/PROM A/PROM  08/27/2021 A/ROM 12/25/2021  Extension Decreased by 50% with pain in right groin full  Right rotation Decreased by 25% with twinge in right groin full  Left rotation Decreased by 50% Full but right groin pain   (Blank rows = not tested)       LE MMT:   MMT Right 08/27/2021 Left 08/27/2021  Hip extension 4/5 4/5  Hip abduction 4+/5 4/5    PELVIC MMT:   MMT   08/27/2021  Vaginal 2/5  (Blank rows = not tested)         PALPATION:   General  left ilium higher than right,  increased fascial tightness in the lower abdomen, tenderness located in iliacus and right hip flexor and quads, tightness in the hip adductors,  Right lumbar L4-S1 is tight, tightness on the right SI joint 10/13/2021: tenderness located in the inguinal canal and increase pain when tighten the lower abdominals.                External Perineal Exam no tenderness externally                             Internal Pelvic Floor increased tightness in the right obturator internist   TONE: Increased on the right pelvic floor   PROLAPSE: rectocele   TODAY'S TREATMENT  10/25/2021 Manual: Spinal mobilization:gapping of left side of L1-L5;  Exercises: Stretches/mobility:1/2 kneel with left foot ahead and right knee on ground ER and move hips from left to right with gluteal squeeze Right hip adductor stretch in lunge position holding 30 seconds then rotate to the right and lift right arm up Frog leg stretch rocking forward and backward Strengthening:one legged bridge 15x each leg Standing with red band around thighs and walk sideways to strengthen gluteus medius Half kneel pulling green band diagonally 10x each way.      10/13/2021 Manual: Soft tissue mobilization:to assess for dry needling. Manual work to the lumbar paraspinals in prone, along the right lower quadrant on left sidely working on the right rectus, psoas, and inguinal ligament Spinal mobilization:sidel glide to L1-L5 grade 3 on both sides Dry needling:Trigger Point Dry-Needling  Treatment instructions: Expect mild to moderate muscle soreness. S/S of pneumothorax if dry needled over a lung field, and to seek immediate medical attention should they occur. Patient verbalized understanding of these instructions and education.   Patient Consent Given: Yes Education handout provided: Previously provided Muscles treated: lumbar multifidi Electrical stimulation performed: No Parameters: N/A Treatment response/outcome: Trigger point  response and elongation of muscles   Exercises: Stretches/mobility:supine rectus stretch with therapist assisting at endrange on the right Strengthening:supine SLR with abdominal contraction 10x each side Tried side plank in sidely on the mat, then the elevated mat then against the wall but had difficulty coordinating her posture and using the abdominals     10/06/2021 Manual: Soft tissue mobilization:to right quadricep, quadratus, and psoas to elongate after dry needling.  Myofascial release: tissue rolling of the right lower quadrant.  Trigger Point Dry-Needling  Treatment instructions: Expect mild to moderate muscle soreness. S/S of pneumothorax if dry needled over a lung field, and to seek immediate medical attention  should they occur. Patient verbalized understanding of these instructions and education.   Patient Consent Given: Yes Education handout provided: Previously provided Muscles treated: right quadratus, right rectus, right vastus lateralis, right psoas Electrical stimulation performed: No Parameters: N/A Treatment response/outcome: trigger point response and elongation of muscles   Exercises: Stretches/mobility: Strengthening:lunges 10x each side with counter support Dead lift 5# each hand x15 with VC to squeeze her shoulder blades, keep spine erect, and cervical in neutral       PATIENT EDUCATION: 10/25/2021 Education details: Access Code: [email or web address] Person educated: Patient Education method: Explanation, Demonstration, and Handouts Education comprehension: verbalized understanding and returned demonstration         HOME EXERCISE PROGRAM: 10/25/2021  Access Code: [email or web address] URL: [email or web address] Date: 10/25/2021 Prepared by: Earlie Counts  Exercises - Quadruped Adductor Stretch  - 1 x daily - 7 x weekly - 1 sets - 2 reps - 30 sec hold - Kneeling Adductor Stretch with Hip External Rotation  - 1 x daily - 7 x weekly - 1 sets - 2 reps - Single  Leg Bridge  - 1 x daily - 7 x weekly - 2 sets - 10 reps - Side Stepping with Resistance at Thighs  - 1 x daily - 7 x weekly - 3 sets - 10 reps   ASSESSMENT:   CLINICAL IMPRESSION: Patient is a 51 y.o. female who was seen today for physical therapy evaluation and treatment for right groin pain. Patient has increased lumbar ROM. She will have right groin pain with rotating trunk to the left in standing.  She is learning how to increase strength around hip to improve her pain. Patient is now getting back to an exercise routing. Patient would benefit from skilled therapy to improve flexibility of her right hip musculature, and mobility of spine and SI joint to reduce her pain and restore her function.      OBJECTIVE IMPAIRMENTS decreased activity tolerance, decreased coordination, decreased endurance, decreased mobility, decreased ROM, decreased strength, increased fascial restrictions, increased muscle spasms, impaired tone, and pain.    ACTIVITY LIMITATIONS driving, occupation, and turning in bed, urinating, and bowel movements.    PERSONAL FACTORS Fitness and 1 comorbidity: endometriosis, removal of right oophorectomy for endometrioma are also affecting patient's functional outcome.      REHAB POTENTIAL: Excellent   CLINICAL DECISION MAKING: Stable/uncomplicated   EVALUATION COMPLEXITY: Low MD signed initial note.      GOALS: Goals reviewed with patient? Yes   SHORT TERM GOALS: Target date: 09/24/2021   Patient is independent with stretches to elongate the tight muscles.  Goal status: Met   2.  Patient reports her pain decreased >/= 25% due to reduction of trigger points in the muscles and increased spinal mobility.  Goal status: Met     LONG TERM GOALS: Target date: 11/19/2021   Patient is independent with advanced HEP to reduce her pain.  Baseline:  Goal status: INITIAL   2.  Patient pelvic floor strength is >/= 3/5 to improve coordination and reduce pain with urination and  bowel movements.  Baseline:  Goal status: INITIAL   3.  Patient has full lumbar ROM without pain due to improved mobility.  Baseline: full lumbar ROM Goal status: met 10/25/2021   4.  Patient is able to sit for a long period of time and get up with minimal to no pain.  Baseline: Patient can sit with sitffness and able to get up with greater ease.  Goal status: ongoing   5.  Patient is able to walk at her normal pace due to improved mobility.  Baseline: Patient is walking at her normal pace more.  Goal status: ongoing.      PLAN: PT FREQUENCY: 1x/week   PT DURATION: 12 weeks   PLANNED INTERVENTIONS: Therapeutic exercises, Therapeutic activity, Neuromuscular re-education, Patient/Family education, Joint mobilization, Dry Needling, Electrical stimulation, Spinal mobilization, Cryotherapy, Moist heat, Taping, Ultrasound, and Manual therapy   PLAN FOR NEXT SESSION: dry needling if needed;  spinal mobilization to L5-S1and SI joint mobilization; check pelvic alignment, check hip abduction and extension       Earlie Counts, PT 10/25/21 8:45 AM

## 2021-10-26 ENCOUNTER — Encounter: Payer: 59 | Admitting: Registered"

## 2021-10-29 DIAGNOSIS — H31002 Unspecified chorioretinal scars, left eye: Secondary | ICD-10-CM | POA: Diagnosis not present

## 2021-10-29 DIAGNOSIS — H40013 Open angle with borderline findings, low risk, bilateral: Secondary | ICD-10-CM | POA: Diagnosis not present

## 2021-10-29 DIAGNOSIS — H26493 Other secondary cataract, bilateral: Secondary | ICD-10-CM | POA: Diagnosis not present

## 2021-10-29 DIAGNOSIS — Z961 Presence of intraocular lens: Secondary | ICD-10-CM | POA: Diagnosis not present

## 2021-11-01 ENCOUNTER — Encounter: Payer: Self-pay | Admitting: Physical Therapy

## 2021-11-01 ENCOUNTER — Ambulatory Visit: Payer: 59 | Admitting: Physical Therapy

## 2021-11-01 DIAGNOSIS — M5459 Other low back pain: Secondary | ICD-10-CM

## 2021-11-01 DIAGNOSIS — R1031 Right lower quadrant pain: Secondary | ICD-10-CM | POA: Diagnosis not present

## 2021-11-01 DIAGNOSIS — R252 Cramp and spasm: Secondary | ICD-10-CM | POA: Diagnosis not present

## 2021-11-01 NOTE — Therapy (Signed)
OUTPATIENT PHYSICAL THERAPY TREATMENT NOTE   Patient Name: Ashley Marshall MRN: [id number] DOB:1970-12-05, 51 y.o., female Today's Date: 11/01/2021  PCP: Glenis Smoker, MD REFERRING PROVIDER: Rolm Bookbinder, MD  END OF SESSION:   PT End of Session - 11/01/21 0805     Visit Number 6    Date for PT Re-Evaluation 11/19/21    Authorization Type [facility]    Authorization - Visit Number 6    Authorization - Number of Visits 25    PT Start Time 0800    PT Stop Time 0840    PT Time Calculation (min) 40 min    Activity Tolerance Patient tolerated treatment well;No increased pain    Behavior During Therapy WFL for tasks assessed/performed             Past Medical History:  Diagnosis Date   Constipation    GERD (gastroesophageal reflux disease)    no meds - diet controlled   Herpes    History of kidney stones    passed stones - no surgery required   HPV in female    Hyperlipidemia    diet controlled - no meds   Missed ab    no surgery required   Seasonal allergies    SVD (spontaneous vaginal delivery)    x 2   Past Surgical History:  Procedure Laterality Date   CATARACT EXTRACTION Bilateral    COLONOSCOPY     x 2 -hx polyps   COLPOSCOPY     DILATATION & CURETTAGE/HYSTEROSCOPY WITH MYOSURE N/A 11/04/2016   Procedure: DILATATION & CURETTAGE/HYSTEROSCOPY WITH MYOSURE;  Surgeon: [name], MD;  Location: [facility];  Service: Gynecology;  Laterality: N/A;   RETINAL DETACHMENT SURGERY Left 2000   ROBOTIC ASSISTED LAPAROSCOPIC OVARIAN CYSTECTOMY Left 11/04/2016   Procedure: ROBOTIC ASSISTED LAPAROSCOPIC OVARIAN CYSTECTOMY;  Surgeon: [name], MD;  Location: [facility];  Service: Gynecology;  Laterality: Left;  RNFA confirmed on 10/26/16 with chass   ROBOTIC ASSISTED SALPINGO OOPHERECTOMY Right 11/04/2016   Procedure: ROBOTIC ASSISTED SALPINGO OOPHORECTOMY;  Surgeon: [name], MD;  Location: [facility];  Service: Gynecology;  Laterality: Right;    WISDOM TOOTH EXTRACTION     There are no problems to display for this patient.  REFERRING DIAG: R10.31 Right groin pain   THERAPY DIAG:  Cramp and spasm   Right inguinal pain   Other low back pain   Rationale for Evaluation and Treatment Rehabilitation   PERTINENT HISTORY: Endometriosis, small uterine fibroid, right oophorectomy for endometrioma   PRECAUTIONS: None SUBJECTIVE: Last couple of days I am able to sit up straight to fully urinate. I have returned to the gym. I still have the pain at times but it leaves right away. The leg press is a trigger. This morning I got in and out of car and did not feel it. No pain with bowel movement.      PAIN:  Are you having pain? Yes NPRS scale: 4/10 Pain location: right groin pain   Pain type: aching, dull, and deep, short lasting Pain description: intermittent    Aggravating factors: movement,  sitting for long period of time and stand up will feel the pain consistently Relieving factors: weight shift, lay flat on back   PATIENT GOALS reduce pain and return to her activity and not worry about the pain   OBJECTIVE: (objective measures completed at initial evaluation unless otherwise dated) OBJECTIVE:    DIAGNOSTIC FINDINGS:  Showed no hernia     COGNITION:  Overall cognitive status: Within functional limits for tasks assessed                          SENSATION:            Light touch: Appears intact            Proprioception: Appears intact     LUMBAR SPECIAL TESTS:  Marcello Moores test: Positive on the right and with ITB       POSTURE:  scoliosis   LUMBARAROM/PROM   A/PROM A/PROM  08/27/2021 A/ROM 10/25/2021  Extension Decreased by 50% with pain in right groin full  Right rotation Decreased by 25% with twinge in right groin full  Left rotation Decreased by 50% Full but right groin pain   (Blank rows = not tested)       LE MMT:   MMT Right 08/27/2021 Left 08/27/2021 Right 11/01/2021 Left 11/01/2021  Hip  extension 4/5 4/5 4+/5 4/5  Hip abduction 4+/5 4/5 4/5 4/5    PELVIC MMT:   MMT   08/27/2021  Vaginal 2/5  (Blank rows = not tested)         PALPATION:   General  left ilium higher than right, increased fascial tightness in the lower abdomen, tenderness located in iliacus and right hip flexor and quads, tightness in the hip adductors,  Right lumbar L4-S1 is tight, tightness on the right SI joint 10/13/2021: tenderness located in the inguinal canal and increase pain when tighten the lower abdominals.  11/01/2021 no tenderness in the right inguinal area with abdominal contraction, pelvis in correct alignment               External Perineal Exam no tenderness externally                             Internal Pelvic Floor increased tightness in the right obturator internist   TONE: Increased on the right pelvic floor   PROLAPSE: rectocele   TODAY'S TREATMENT  11/01/2021 Exercises: Stretches/mobility: tall kneel stretching anterior hip Hip flexor stretch with back leg ER bil. 30 sec Prone press up 10x  Pigeon pose with hip flexor stretch hold 30 sec bil.  Strengthening:hip flexion resistance holding 5 sec 10x each in supine SLR with abdominal contraction 10x each side Supine with upper body on mat hip thrust with 8# wt.     10/25/2021 Manual: Spinal mobilization:gapping of left side of L1-L5;  Exercises: Stretches/mobility:1/2 kneel with left foot ahead and right knee on ground ER and move hips from left to right with gluteal squeeze Right hip adductor stretch in lunge position holding 30 seconds then rotate to the right and lift right arm up Frog leg stretch rocking forward and backward Strengthening:one legged bridge 15x each leg Standing with red band around thighs and walk sideways to strengthen gluteus medius Half kneel pulling green band diagonally 10x each way.        10/13/2021 Manual: Soft tissue mobilization:to assess for dry needling. Manual work to the lumbar  paraspinals in prone, along the right lower quadrant on left sidely working on the right rectus, psoas, and inguinal ligament Spinal mobilization:sidel glide to L1-L5 grade 3 on both sides Dry needling:Trigger Point Dry-Needling  Treatment instructions: Expect mild to moderate muscle soreness. S/S of pneumothorax if dry needled over a lung field, and to seek immediate medical attention should they occur. Patient verbalized understanding of these instructions and  education.   Patient Consent Given: Yes Education handout provided: Previously provided Muscles treated: lumbar multifidi Electrical stimulation performed: No Parameters: N/A Treatment response/outcome: Trigger point response and elongation of muscles   Exercises: Stretches/mobility:supine rectus stretch with therapist assisting at endrange on the right Strengthening:supine SLR with abdominal contraction 10x each side Tried side plank in sidely on the mat, then the elevated mat then against the wall but had difficulty coordinating her posture and using the abdominals        PATIENT EDUCATION: 11/01/2021 Education details: Access Code: [email or web address] Person educated: Patient Education method: Explanation, Demonstration, and Handouts Education comprehension: verbalized understanding and returned demonstration         HOME EXERCISE PROGRAM: 11/01/2021  Access Code: [email or web address] URL: [email or web address] Date: 11/01/2021 Prepared by: Earlie Counts  Exercises -- Supine Hip Extension on Bench  - 1 x daily - 3 x weekly - 3 sets - 10 reps - Hooklying Isometric Hip Flexion  - 1 x daily - 3 x weekly - 2 sets - 10 reps   ASSESSMENT:   CLINICAL IMPRESSION: Patient is a 51 y.o. female who was seen today for physical therapy treatment for right groin pain. she has weakness in the hip abductors and extensors. She does not have to bend forward to urinate. Her pelvis in correct alignment. Patient is now going to the gym and  working on her strength and endurance. Patient has weakness in the lower abdomen and is understanding how to contract correctly instead of lifting her lower rib cage.  Patient would benefit from skilled therapy to improve flexibility of her right hip musculature, and mobility of spine and SI joint to reduce her pain and restore her function.      OBJECTIVE IMPAIRMENTS decreased activity tolerance, decreased coordination, decreased endurance, decreased mobility, decreased ROM, decreased strength, increased fascial restrictions, increased muscle spasms, impaired tone, and pain.    ACTIVITY LIMITATIONS driving, occupation, and turning in bed, urinating, and bowel movements.    PERSONAL FACTORS Fitness and 1 comorbidity: endometriosis, removal of right oophorectomy for endometrioma are also affecting patient's functional outcome.      REHAB POTENTIAL: Excellent   CLINICAL DECISION MAKING: Stable/uncomplicated   EVALUATION COMPLEXITY: Low MD signed initial note.      GOALS: Goals reviewed with patient? Yes   SHORT TERM GOALS: Target date: 09/24/2021   Patient is independent with stretches to elongate the tight muscles.  Goal status: Met   2.  Patient reports her pain decreased >/= 25% due to reduction of trigger points in the muscles and increased spinal mobility.  Goal status: Met     LONG TERM GOALS: Target date: 11/19/2021   Patient is independent with advanced HEP to reduce her pain.  Baseline:  Goal status: INITIAL   2.  Patient pelvic floor strength is >/= 3/5 to improve coordination and reduce pain with urination and bowel movements.  Baseline:  Goal status: INITIAL   3.  Patient has full lumbar ROM without pain due to improved mobility.  Baseline: full lumbar ROM Goal status: met 10/25/2021   4.  Patient is able to sit for a long period of time and get up with minimal to no pain.  Baseline: Patient can sit with sitffness and able to get up with greater ease.  Goal status:  ongoing   5.  Patient is able to walk at her normal pace due to improved mobility.  Baseline: Patient is walking at her normal pace more.  Goal status: ongoing.  PLAN: PT FREQUENCY: 1x/week   PT DURATION: 12 weeks   PLANNED INTERVENTIONS: Therapeutic exercises, Therapeutic activity, Neuromuscular re-education, Patient/Family education, Joint mobilization, Dry Needling, Electrical stimulation, Spinal mobilization, Cryotherapy, Moist heat, Taping, Ultrasound, and Manual therapy   PLAN FOR NEXT SESSION: dry needling if needed;  hip abduction and extensor strength, advance lower abdominal strength   Earlie Counts, PT 11/01/21 8:44 AM

## 2021-11-05 DIAGNOSIS — F411 Generalized anxiety disorder: Secondary | ICD-10-CM | POA: Diagnosis not present

## 2021-11-10 ENCOUNTER — Encounter: Payer: 59 | Admitting: Physical Therapy

## 2021-11-15 ENCOUNTER — Ambulatory Visit: Payer: 59 | Attending: General Surgery | Admitting: Physical Therapy

## 2021-11-15 ENCOUNTER — Encounter: Payer: Self-pay | Admitting: Physical Therapy

## 2021-11-15 DIAGNOSIS — R1031 Right lower quadrant pain: Secondary | ICD-10-CM

## 2021-11-15 DIAGNOSIS — M5459 Other low back pain: Secondary | ICD-10-CM | POA: Diagnosis not present

## 2021-11-15 DIAGNOSIS — R252 Cramp and spasm: Secondary | ICD-10-CM

## 2021-11-15 NOTE — Therapy (Signed)
OUTPATIENT PHYSICAL THERAPY TREATMENT NOTE   Patient Name: Ashley Marshall MRN: [id number] DOB:1970-10-19, 51 y.o., female Today's Date: 11/15/2021  PCP:  Glenis Smoker, MD REFERRING PROVIDER: Rolm Bookbinder, MD  END OF SESSION:   PT End of Session - 11/15/21 1533     Visit Number 7    Date for PT Re-Evaluation 11/19/21    Authorization Type [facility]    Authorization - Visit Number 7    Authorization - Number of Visits 25    PT Start Time 5732    PT Stop Time 1610    PT Time Calculation (min) 40 min    Activity Tolerance Patient tolerated treatment well;No increased pain    Behavior During Therapy WFL for tasks assessed/performed             Past Medical History:  Diagnosis Date   Constipation    GERD (gastroesophageal reflux disease)    no meds - diet controlled   Herpes    History of kidney stones    passed stones - no surgery required   HPV in female    Hyperlipidemia    diet controlled - no meds   Missed ab    no surgery required   Seasonal allergies    SVD (spontaneous vaginal delivery)    x 2   Past Surgical History:  Procedure Laterality Date   CATARACT EXTRACTION Bilateral    COLONOSCOPY     x 2 -hx polyps   COLPOSCOPY     DILATATION & CURETTAGE/HYSTEROSCOPY WITH MYOSURE N/A 11/04/2016   Procedure: DILATATION & CURETTAGE/HYSTEROSCOPY WITH MYOSURE;  Surgeon: [name], MD;  Location: [facility];  Service: Gynecology;  Laterality: N/A;   RETINAL DETACHMENT SURGERY Left 2000   ROBOTIC ASSISTED LAPAROSCOPIC OVARIAN CYSTECTOMY Left 11/04/2016   Procedure: ROBOTIC ASSISTED LAPAROSCOPIC OVARIAN CYSTECTOMY;  Surgeon: [name], MD;  Location: [facility];  Service: Gynecology;  Laterality: Left;  RNFA confirmed on 10/26/16 with chass   ROBOTIC ASSISTED SALPINGO OOPHERECTOMY Right 11/04/2016   Procedure: ROBOTIC ASSISTED SALPINGO OOPHORECTOMY;  Surgeon: [name], MD;  Location: [facility];  Service: Gynecology;  Laterality: Right;    WISDOM TOOTH EXTRACTION     There are no problems to display for this patient. REFERRING DIAG: R10.31 Right groin pain   THERAPY DIAG:  Cramp and spasm   Right inguinal pain   Other low back pain   Rationale for Evaluation and Treatment Rehabilitation   PERTINENT HISTORY: Endometriosis, small uterine fibroid, right oophorectomy for endometrioma   PRECAUTIONS: None SUBJECTIVE: I have noticed the pain and it leaves quickly. I keep moving. I am 80% better since initial evaluation.  The pain last several seconds and she will keep moving. She has days without pain.  PAIN:  Are you having pain? Yes NPRS scale: 4/10 Pain location: right groin pain   Pain type: aching, dull, and deep, short lasting Pain description: intermittent    Aggravating factors: movement,  sitting for long period of time and stand up will feel the pain consistently Relieving factors: weight shift, lay flat on back   PATIENT GOALS reduce pain and return to her activity and not worry about the pain   OBJECTIVE: (objective measures completed at initial evaluation unless otherwise dated) OBJECTIVE:    DIAGNOSTIC FINDINGS:  Showed no hernia     COGNITION:            Overall cognitive status: Within functional limits for tasks assessed  SENSATION:            Light touch: Appears intact            Proprioception: Appears intact     LUMBAR SPECIAL TESTS:  Marcello Moores test: Positive on the right and with ITB       POSTURE:  scoliosis   LUMBARAROM/PROM   A/PROM A/PROM  08/27/2021 A/ROM 10/25/2021 11/15/2021  Extension Decreased by 50% with pain in right groin full full  Right rotation Decreased by 25% with twinge in right groin full full  Left rotation Decreased by 50% Full but right groin pain Full and no right groin pain   (Blank rows = not tested)       LE MMT:   MMT Right 08/27/2021 Left 08/27/2021 Right 11/01/2021 Left 11/01/2021 Right  11/15/2021 Left 11/15/2021  Hip  extension 4/5 4/5 4+/5 4/5 4+/5 4+/5  Hip abduction 4+/5 4/5 4/5 4/5 4/5 4/5    PELVIC MMT:   MMT   08/27/2021  Vaginal 2/5  (Blank rows = not tested)         PALPATION:   General  left ilium higher than right, increased fascial tightness in the lower abdomen, tenderness located in iliacus and right hip flexor and quads, tightness in the hip adductors,  Right lumbar L4-S1 is tight, tightness on the right SI joint 10/13/2021: tenderness located in the inguinal canal and increase pain when tighten the lower abdominals.  11/01/2021 no tenderness in the right inguinal area with abdominal contraction, pelvis in correct alignment               External Perineal Exam no tenderness externally                             Internal Pelvic Floor increased tightness in the right obturator internist   TONE: Increased on the right pelvic floor   PROLAPSE: rectocele   TODAY'S TREATMENT  11/15/2021 Manual: Myofascial release: Myofascial release to the lower abdomen with criss cross hand placement to elongate the tissue, fascial release along the trigger points in the right lower quadrant to reduce the pain and tightness Spinal mobilization:PA mobilization to T4-L5 grade 3    11/01/2021 Exercises: Stretches/mobility: tall kneel stretching anterior hip Hip flexor stretch with back leg ER bil. 30 sec Prone press up 10x  Pigeon pose with hip flexor stretch hold 30 sec bil.  Strengthening:hip flexion resistance holding 5 sec 10x each in supine SLR with abdominal contraction 10x each side Supine with upper body on mat hip thrust with 8# wt.      10/25/2021 Manual: Spinal mobilization:gapping of left side of L1-L5;  Exercises: Stretches/mobility:1/2 kneel with left foot ahead and right knee on ground ER and move hips from left to right with gluteal squeeze Right hip adductor stretch in lunge position holding 30 seconds then rotate to the right and lift right arm up Frog leg stretch rocking forward  and backward Strengthening:one legged bridge 15x each leg Standing with red band around thighs and walk sideways to strengthen gluteus medius Half kneel pulling green band diagonally 10x each way.          PATIENT EDUCATION: 11/01/2021 Education details: Access Code: [email or web address] Person educated: Patient Education method: Explanation, Demonstration, and Handouts Education comprehension: verbalized understanding and returned demonstration         HOME EXERCISE PROGRAM: 11/01/2021  Access Code: [email or web address] URL: [email or web address] Date: 11/01/2021 Prepared by: Earlie Counts  Exercises -- Supine Hip Extension on Bench  - 1 x daily - 3 x weekly - 3 sets - 10 reps - Hooklying Isometric Hip Flexion  - 1 x daily - 3 x weekly - 2 sets - 10 reps   ASSESSMENT:   CLINICAL IMPRESSION: Patient is a 51 y.o. female who was seen today for physical therapy treatment for right groin pain. Patient has full lumbar ROM and no pain when she rotates to the left in the right groin She has weakness in the hip abductors and extensors. Patient reports her pain is 80% better. Patient reports her pain will only last several seconds and she is able to move through the pain. Patient has less trigger points in the lower abdominal region especially on the right lower quadrant. She is going to a gym and is now on a walking program.      OBJECTIVE IMPAIRMENTS decreased activity tolerance, decreased coordination, decreased endurance, decreased mobility, decreased ROM, decreased strength, increased fascial restrictions, increased muscle spasms, impaired tone, and pain.    ACTIVITY LIMITATIONS driving, occupation, and turning in bed, urinating, and bowel movements.    PERSONAL FACTORS Fitness and 1 comorbidity: endometriosis, removal of right oophorectomy for endometrioma are also affecting patient's functional outcome.      REHAB POTENTIAL: Excellent   CLINICAL DECISION MAKING: Stable/uncomplicated    EVALUATION COMPLEXITY: Low MD signed initial note.      GOALS: Goals reviewed with patient? Yes   SHORT TERM GOALS: Target date: 09/24/2021   Patient is independent with stretches to elongate the tight muscles.  Goal status: Met   2.  Patient reports her pain decreased >/= 25% due to reduction of trigger points in the muscles and increased spinal mobility.  Goal status: Met     LONG TERM GOALS: Target date: 11/19/2021   Patient is independent with advanced HEP to reduce her pain.  Baseline:  Goal status: Met 11/15/2021   2.  Patient pelvic floor strength is >/= 3/5 to improve coordination and reduce pain with urination and bowel movements.  Baseline:  Goal status: not assessed 11/15/2021   3.  Patient has full lumbar ROM without pain due to improved mobility.  Baseline: full lumbar ROM Goal status: met 10/25/2021   4.  Patient is able to sit for a long period of time and get up with minimal to no pain.  Baseline: Patient can sit with sitffness and able to get up with greater ease.  Goal status: Met 11/15/2021   5.  Patient is able to walk at her normal pace due to improved mobility.  Baseline: Patient is walking at her normal pace more.  Goal status: met 11/15/2021     PLAN: PT FREQUENCY: 1x/week   PT DURATION: 12 weeks   PLANNED INTERVENTIONS: Therapeutic exercises, Therapeutic activity, Neuromuscular re-education, Patient/Family education, Joint mobilization, Dry Needling, Electrical stimulation, Spinal mobilization, Cryotherapy, Moist heat, Taping, Ultrasound, and Manual therapy   PLAN FOR NEXT SESSION: Discharge to HEP     Earlie Counts, PT 11/15/21 4:10 PM PHYSICAL THERAPY DISCHARGE SUMMARY  Visits from Start of Care: 7  Current functional level related to goals / functional outcomes: See above.    Remaining deficits: See above.    Education / Equipment: HEP   Patient agrees to discharge. Patient goals were met. Patient is being discharged due to meeting the  stated rehab goals. Thank you for the referral. Earlie Counts, PT 11/15/21 4:15 PM

## 2021-11-18 ENCOUNTER — Other Ambulatory Visit (HOSPITAL_COMMUNITY): Payer: Self-pay

## 2021-11-23 ENCOUNTER — Other Ambulatory Visit (HOSPITAL_COMMUNITY): Payer: Self-pay

## 2021-11-25 ENCOUNTER — Other Ambulatory Visit (HOSPITAL_COMMUNITY): Payer: Self-pay

## 2021-11-25 DIAGNOSIS — F411 Generalized anxiety disorder: Secondary | ICD-10-CM | POA: Diagnosis not present

## 2021-11-25 MED ORDER — SOOLANTRA 1 % EX CREA
TOPICAL_CREAM | CUTANEOUS | 3 refills | Status: DC
  Filled 2021-11-25: qty 45, 30d supply, fill #0

## 2021-11-26 ENCOUNTER — Other Ambulatory Visit (HOSPITAL_COMMUNITY): Payer: Self-pay

## 2021-11-26 IMAGING — MG DIGITAL SCREENING BILAT W/ TOMO W/ CAD
8 series · 8 of 24 positions shown · non-contrast
Comparison: Previous exam(s).

CLINICAL DATA: Screening.

EXAM:
DIGITAL SCREENING BILATERAL MAMMOGRAM WITH TOMO AND CAD

[R MLO synth-2D]
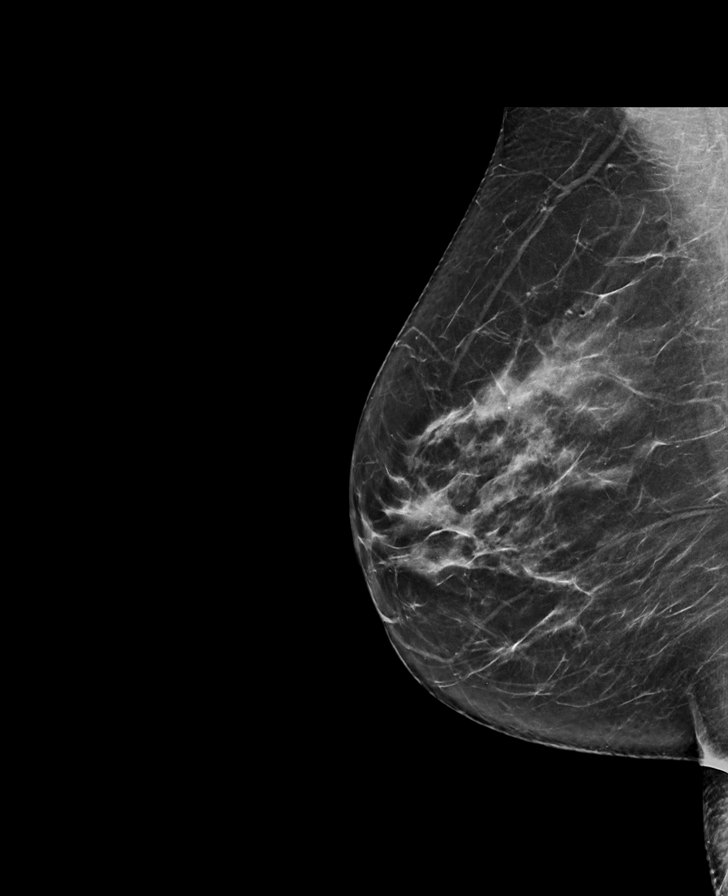

[R CC synth-2D]
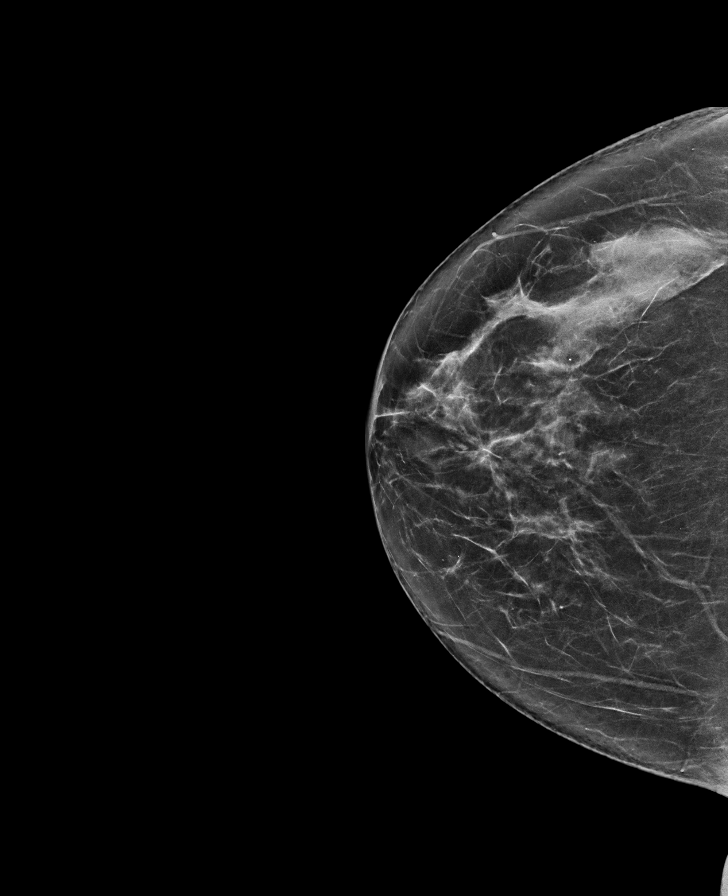

[L MLO synth-2D]
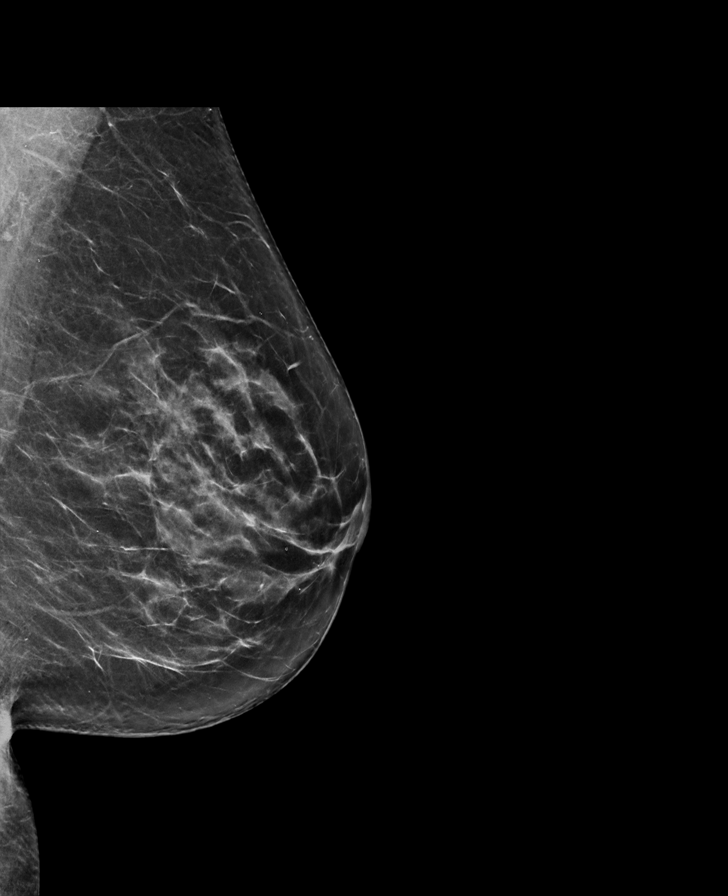

[L CC synth-2D]
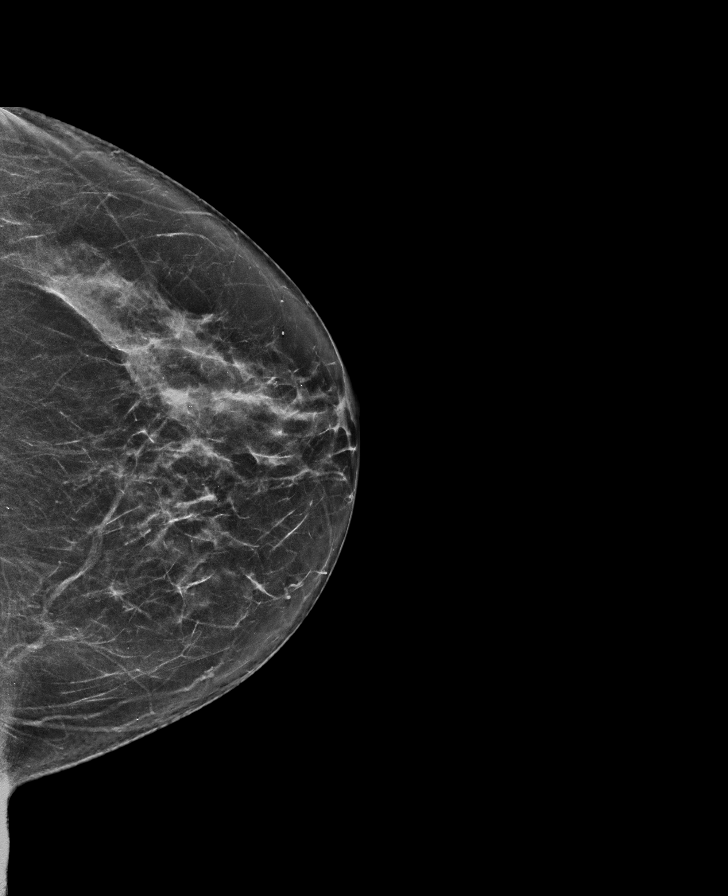

[L CC tomo · tomo slice 35/70.0]
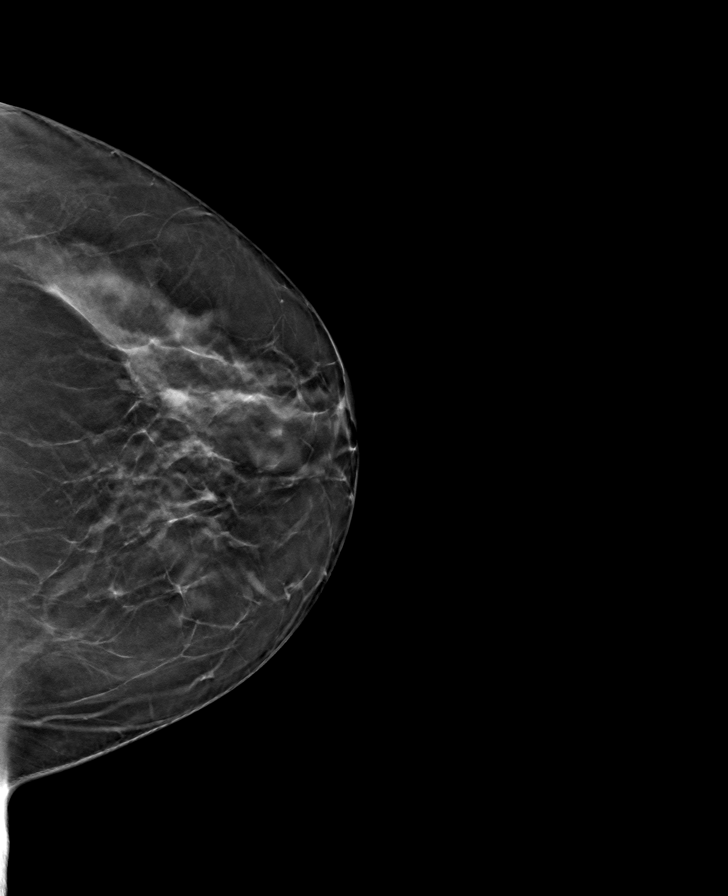

[R CC tomo · tomo slice 37/74.0]
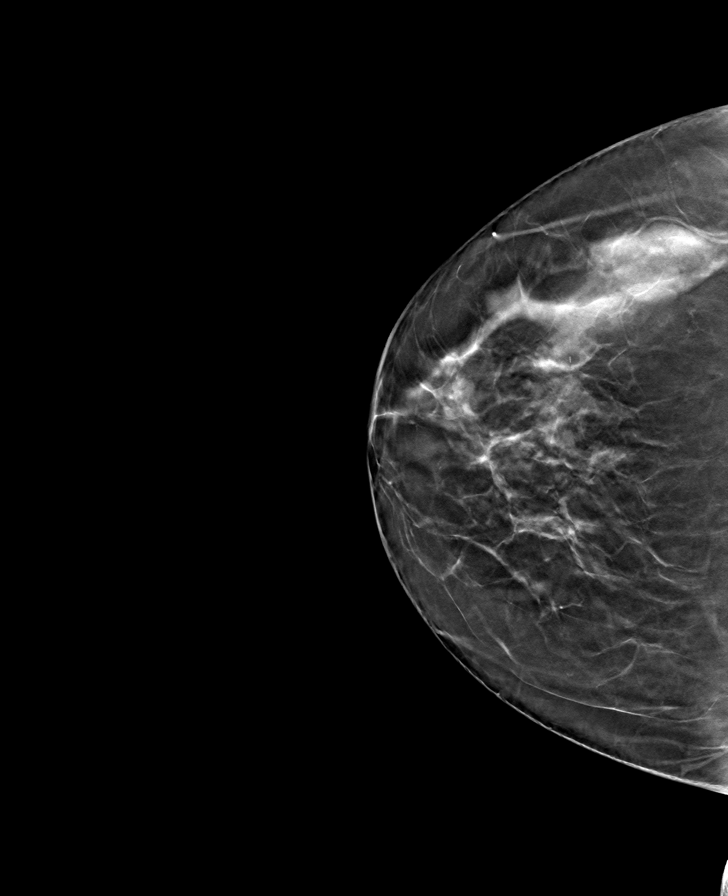

[R MLO tomo · tomo slice 38/75.0]
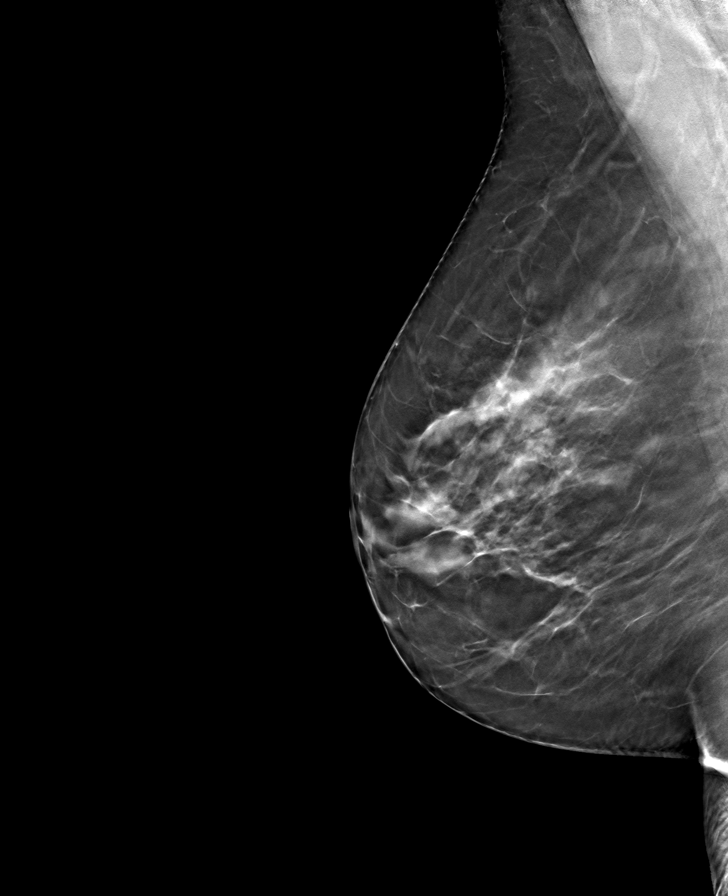

[L MLO tomo · tomo slice 38/75.0]
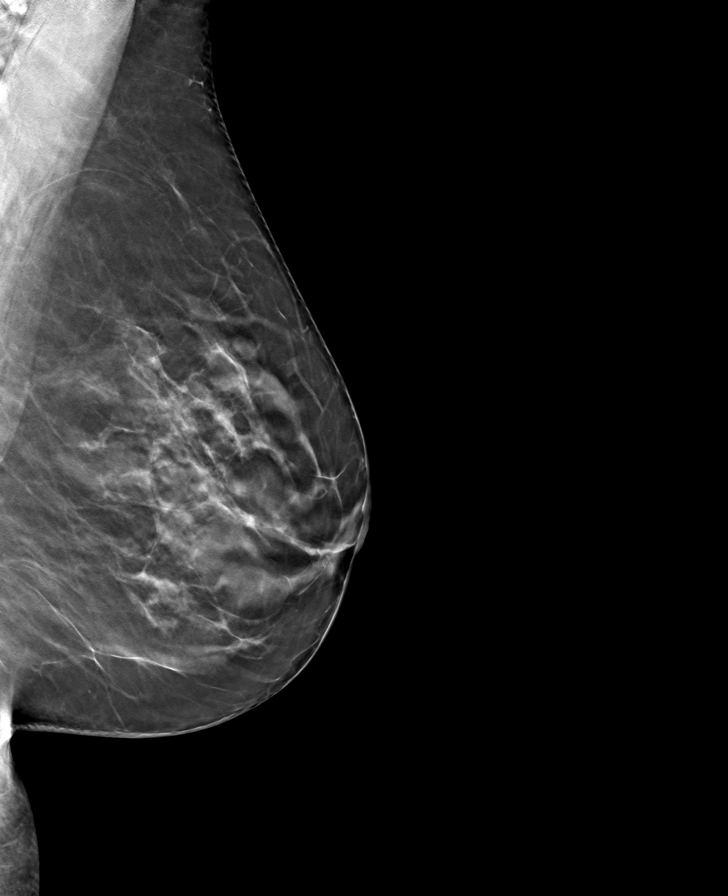

[8 of 24 positions shown; findings below may reference images not displayed]

ACR Breast Density Category c: The breast tissue is heterogeneously
dense, which may obscure small masses.
FINDINGS: There are no findings suspicious for malignancy. Images were
processed with CAD.
IMPRESSION: No mammographic evidence of malignancy. A result letter of this
screening mammogram will be mailed directly to the patient.

RECOMMENDATION:
Screening mammogram in one year. (Code:FT-U-LHB)

BI-RADS CATEGORY  1: Negative.

## 2021-11-30 ENCOUNTER — Other Ambulatory Visit (HOSPITAL_COMMUNITY): Payer: Self-pay

## 2021-12-01 ENCOUNTER — Other Ambulatory Visit (HOSPITAL_COMMUNITY): Payer: Self-pay

## 2021-12-13 ENCOUNTER — Ambulatory Visit: Payer: 59 | Admitting: Dietician

## 2021-12-17 ENCOUNTER — Other Ambulatory Visit (HOSPITAL_COMMUNITY): Payer: Self-pay

## 2021-12-17 DIAGNOSIS — R5383 Other fatigue: Secondary | ICD-10-CM | POA: Diagnosis not present

## 2021-12-17 DIAGNOSIS — Z0000 Encounter for general adult medical examination without abnormal findings: Secondary | ICD-10-CM | POA: Diagnosis not present

## 2021-12-17 DIAGNOSIS — E7800 Pure hypercholesterolemia, unspecified: Secondary | ICD-10-CM | POA: Diagnosis not present

## 2021-12-17 DIAGNOSIS — R200 Anesthesia of skin: Secondary | ICD-10-CM | POA: Diagnosis not present

## 2021-12-17 DIAGNOSIS — E559 Vitamin D deficiency, unspecified: Secondary | ICD-10-CM | POA: Diagnosis not present

## 2021-12-17 DIAGNOSIS — Z79899 Other long term (current) drug therapy: Secondary | ICD-10-CM | POA: Diagnosis not present

## 2021-12-17 DIAGNOSIS — R202 Paresthesia of skin: Secondary | ICD-10-CM | POA: Diagnosis not present

## 2021-12-17 MED ORDER — TRAZODONE HCL 50 MG PO TABS
ORAL_TABLET | ORAL | 3 refills | Status: DC
  Filled 2021-12-17: qty 45, 90d supply, fill #0

## 2021-12-17 MED ORDER — PREDNISONE 20 MG PO TABS
ORAL_TABLET | ORAL | 0 refills | Status: DC
  Filled 2021-12-17: qty 10, 5d supply, fill #0

## 2021-12-21 ENCOUNTER — Other Ambulatory Visit (HOSPITAL_COMMUNITY): Payer: Self-pay | Admitting: Family Medicine

## 2021-12-21 ENCOUNTER — Telehealth (HOSPITAL_BASED_OUTPATIENT_CLINIC_OR_DEPARTMENT_OTHER): Payer: Self-pay

## 2021-12-21 ENCOUNTER — Other Ambulatory Visit (HOSPITAL_BASED_OUTPATIENT_CLINIC_OR_DEPARTMENT_OTHER): Payer: Self-pay | Admitting: Family Medicine

## 2021-12-21 DIAGNOSIS — R202 Paresthesia of skin: Secondary | ICD-10-CM

## 2021-12-21 DIAGNOSIS — M5432 Sciatica, left side: Secondary | ICD-10-CM

## 2021-12-21 DIAGNOSIS — R200 Anesthesia of skin: Secondary | ICD-10-CM

## 2021-12-22 ENCOUNTER — Other Ambulatory Visit (HOSPITAL_COMMUNITY): Payer: Self-pay

## 2021-12-23 ENCOUNTER — Ambulatory Visit: Payer: 59 | Admitting: Dietician

## 2021-12-25 ENCOUNTER — Ambulatory Visit (HOSPITAL_BASED_OUTPATIENT_CLINIC_OR_DEPARTMENT_OTHER)
Admission: RE | Admit: 2021-12-25 | Discharge: 2021-12-25 | Disposition: A | Discharge status: Home / Self Care | Payer: 59 | Source: Ambulatory Visit | Attending: Family Medicine | Admitting: Family Medicine

## 2021-12-25 ENCOUNTER — Other Ambulatory Visit (HOSPITAL_BASED_OUTPATIENT_CLINIC_OR_DEPARTMENT_OTHER): Payer: 59

## 2021-12-25 DIAGNOSIS — M5432 Sciatica, left side: Secondary | ICD-10-CM | POA: Diagnosis not present

## 2021-12-25 DIAGNOSIS — R202 Paresthesia of skin: Secondary | ICD-10-CM | POA: Diagnosis not present

## 2021-12-25 DIAGNOSIS — R200 Anesthesia of skin: Secondary | ICD-10-CM | POA: Insufficient documentation

## 2021-12-25 DIAGNOSIS — M5126 Other intervertebral disc displacement, lumbar region: Secondary | ICD-10-CM | POA: Diagnosis not present

## 2021-12-25 MED ORDER — GADOBUTROL 1 MMOL/ML IV SOLN
7.0000 mL | Freq: Once | INTRAVENOUS | Status: AC | PRN
  Administered 2021-12-25: 7 mL via INTRAVENOUS

## 2021-12-27 DIAGNOSIS — F411 Generalized anxiety disorder: Secondary | ICD-10-CM | POA: Diagnosis not present

## 2022-01-04 ENCOUNTER — Ambulatory Visit: Payer: 59 | Admitting: Dietician

## 2022-01-17 DIAGNOSIS — F411 Generalized anxiety disorder: Secondary | ICD-10-CM | POA: Diagnosis not present

## 2022-01-24 ENCOUNTER — Other Ambulatory Visit (HOSPITAL_COMMUNITY): Payer: Self-pay

## 2022-02-02 DIAGNOSIS — F411 Generalized anxiety disorder: Secondary | ICD-10-CM | POA: Diagnosis not present

## 2022-02-10 ENCOUNTER — Other Ambulatory Visit (HOSPITAL_COMMUNITY): Payer: Self-pay

## 2022-02-12 DIAGNOSIS — F411 Generalized anxiety disorder: Secondary | ICD-10-CM | POA: Diagnosis not present

## 2022-03-03 DIAGNOSIS — F411 Generalized anxiety disorder: Secondary | ICD-10-CM | POA: Diagnosis not present

## 2022-03-09 ENCOUNTER — Other Ambulatory Visit: Payer: Self-pay | Admitting: Family Medicine

## 2022-03-09 DIAGNOSIS — Z1231 Encounter for screening mammogram for malignant neoplasm of breast: Secondary | ICD-10-CM

## 2022-03-21 DIAGNOSIS — F411 Generalized anxiety disorder: Secondary | ICD-10-CM | POA: Diagnosis not present

## 2022-03-30 DIAGNOSIS — L719 Rosacea, unspecified: Secondary | ICD-10-CM | POA: Diagnosis not present

## 2022-03-30 DIAGNOSIS — Z86018 Personal history of other benign neoplasm: Secondary | ICD-10-CM | POA: Diagnosis not present

## 2022-03-30 DIAGNOSIS — D225 Melanocytic nevi of trunk: Secondary | ICD-10-CM | POA: Diagnosis not present

## 2022-03-30 DIAGNOSIS — L814 Other melanin hyperpigmentation: Secondary | ICD-10-CM | POA: Diagnosis not present

## 2022-03-30 DIAGNOSIS — D485 Neoplasm of uncertain behavior of skin: Secondary | ICD-10-CM | POA: Diagnosis not present

## 2022-03-30 DIAGNOSIS — L821 Other seborrheic keratosis: Secondary | ICD-10-CM | POA: Diagnosis not present

## 2022-03-30 DIAGNOSIS — D2271 Melanocytic nevi of right lower limb, including hip: Secondary | ICD-10-CM | POA: Diagnosis not present

## 2022-03-30 DIAGNOSIS — L720 Epidermal cyst: Secondary | ICD-10-CM | POA: Diagnosis not present

## 2022-03-30 DIAGNOSIS — L739 Follicular disorder, unspecified: Secondary | ICD-10-CM | POA: Diagnosis not present

## 2022-04-06 DIAGNOSIS — J310 Chronic rhinitis: Secondary | ICD-10-CM | POA: Diagnosis not present

## 2022-04-06 DIAGNOSIS — J342 Deviated nasal septum: Secondary | ICD-10-CM | POA: Diagnosis not present

## 2022-04-06 DIAGNOSIS — J343 Hypertrophy of nasal turbinates: Secondary | ICD-10-CM | POA: Diagnosis not present

## 2022-04-08 DIAGNOSIS — F411 Generalized anxiety disorder: Secondary | ICD-10-CM | POA: Diagnosis not present

## 2022-04-23 ENCOUNTER — Other Ambulatory Visit (HOSPITAL_COMMUNITY): Payer: Self-pay

## 2022-04-25 ENCOUNTER — Other Ambulatory Visit (HOSPITAL_COMMUNITY): Payer: Self-pay

## 2022-04-25 MED ORDER — CYCLOSPORINE 0.05 % OP EMUL
1.0000 [drp] | Freq: Two times a day (BID) | OPHTHALMIC | 99 refills | Status: DC
Start: 2022-04-25 — End: 2023-05-16
  Filled 2022-04-25: qty 180, 90d supply, fill #0
  Filled 2022-08-16 – 2022-08-25 (×2): qty 180, 90d supply, fill #1
  Filled 2022-11-21: qty 180, 90d supply, fill #2
  Filled 2023-03-05: qty 180, 90d supply, fill #3
  Filled 2023-04-17 – 2023-05-16 (×2): qty 180, 90d supply, fill #4

## 2022-05-02 ENCOUNTER — Other Ambulatory Visit (HOSPITAL_COMMUNITY): Payer: Self-pay

## 2022-05-02 DIAGNOSIS — J3089 Other allergic rhinitis: Secondary | ICD-10-CM | POA: Diagnosis not present

## 2022-05-02 DIAGNOSIS — J300 Vasomotor rhinitis: Secondary | ICD-10-CM | POA: Diagnosis not present

## 2022-05-02 MED ORDER — MONTELUKAST SODIUM 10 MG PO TABS
10.0000 mg | ORAL_TABLET | Freq: Every day | ORAL | 3 refills | Status: DC
  Filled 2022-05-02: qty 30, 30d supply, fill #0

## 2022-05-03 DIAGNOSIS — Z1231 Encounter for screening mammogram for malignant neoplasm of breast: Secondary | ICD-10-CM

## 2022-05-06 ENCOUNTER — Ambulatory Visit
Admission: RE | Admit: 2022-05-06 | Discharge: 2022-05-06 | Disposition: A | Discharge status: Home / Self Care | Payer: 59 | Source: Ambulatory Visit | Attending: Family Medicine | Admitting: Family Medicine

## 2022-05-06 DIAGNOSIS — F411 Generalized anxiety disorder: Secondary | ICD-10-CM | POA: Diagnosis not present

## 2022-05-06 DIAGNOSIS — Z1231 Encounter for screening mammogram for malignant neoplasm of breast: Secondary | ICD-10-CM

## 2022-05-10 ENCOUNTER — Other Ambulatory Visit (HOSPITAL_COMMUNITY): Payer: Self-pay

## 2022-05-26 DIAGNOSIS — H26492 Other secondary cataract, left eye: Secondary | ICD-10-CM | POA: Diagnosis not present

## 2022-06-09 DIAGNOSIS — H26491 Other secondary cataract, right eye: Secondary | ICD-10-CM | POA: Diagnosis not present

## 2022-06-09 DIAGNOSIS — F411 Generalized anxiety disorder: Secondary | ICD-10-CM | POA: Diagnosis not present

## 2022-07-22 ENCOUNTER — Encounter (INDEPENDENT_AMBULATORY_CARE_PROVIDER_SITE_OTHER): Payer: Commercial Managed Care - PPO | Admitting: Ophthalmology

## 2022-07-26 DIAGNOSIS — F411 Generalized anxiety disorder: Secondary | ICD-10-CM | POA: Diagnosis not present

## 2022-08-13 ENCOUNTER — Ambulatory Visit: Payer: Commercial Managed Care - PPO

## 2022-08-16 ENCOUNTER — Other Ambulatory Visit (HOSPITAL_COMMUNITY): Payer: Self-pay

## 2022-08-16 DIAGNOSIS — F411 Generalized anxiety disorder: Secondary | ICD-10-CM | POA: Diagnosis not present

## 2022-08-16 DIAGNOSIS — Z03818 Encounter for observation for suspected exposure to other biological agents ruled out: Secondary | ICD-10-CM | POA: Diagnosis not present

## 2022-08-16 DIAGNOSIS — R059 Cough, unspecified: Secondary | ICD-10-CM | POA: Diagnosis not present

## 2022-08-16 DIAGNOSIS — K219 Gastro-esophageal reflux disease without esophagitis: Secondary | ICD-10-CM | POA: Diagnosis not present

## 2022-08-23 DIAGNOSIS — R1032 Left lower quadrant pain: Secondary | ICD-10-CM | POA: Diagnosis not present

## 2022-08-24 ENCOUNTER — Other Ambulatory Visit (HOSPITAL_COMMUNITY): Payer: Self-pay

## 2022-08-25 ENCOUNTER — Other Ambulatory Visit (HOSPITAL_COMMUNITY): Payer: Self-pay

## 2022-08-26 ENCOUNTER — Other Ambulatory Visit (HOSPITAL_COMMUNITY): Payer: Self-pay

## 2022-08-26 MED ORDER — AMOXICILLIN-POT CLAVULANATE 875-125 MG PO TABS
1.0000 | ORAL_TABLET | Freq: Two times a day (BID) | ORAL | 0 refills | Status: DC
  Filled 2022-08-26: qty 20, 10d supply, fill #0

## 2022-08-27 ENCOUNTER — Other Ambulatory Visit (HOSPITAL_COMMUNITY): Payer: Self-pay

## 2022-08-29 ENCOUNTER — Other Ambulatory Visit: Payer: Self-pay

## 2022-09-01 ENCOUNTER — Other Ambulatory Visit (HOSPITAL_COMMUNITY): Payer: Self-pay

## 2022-09-02 ENCOUNTER — Other Ambulatory Visit (HOSPITAL_COMMUNITY): Payer: Self-pay

## 2022-09-07 DIAGNOSIS — J342 Deviated nasal septum: Secondary | ICD-10-CM | POA: Diagnosis not present

## 2022-09-07 DIAGNOSIS — J343 Hypertrophy of nasal turbinates: Secondary | ICD-10-CM | POA: Diagnosis not present

## 2022-09-07 DIAGNOSIS — J310 Chronic rhinitis: Secondary | ICD-10-CM | POA: Diagnosis not present

## 2022-09-08 ENCOUNTER — Other Ambulatory Visit: Payer: Self-pay

## 2022-09-15 DIAGNOSIS — F411 Generalized anxiety disorder: Secondary | ICD-10-CM | POA: Diagnosis not present

## 2022-09-21 ENCOUNTER — Encounter (INDEPENDENT_AMBULATORY_CARE_PROVIDER_SITE_OTHER): Payer: 59 | Admitting: Ophthalmology

## 2022-09-21 DIAGNOSIS — H338 Other retinal detachments: Secondary | ICD-10-CM | POA: Diagnosis not present

## 2022-09-21 DIAGNOSIS — H33301 Unspecified retinal break, right eye: Secondary | ICD-10-CM | POA: Diagnosis not present

## 2022-09-21 DIAGNOSIS — H43813 Vitreous degeneration, bilateral: Secondary | ICD-10-CM

## 2022-09-26 DIAGNOSIS — F411 Generalized anxiety disorder: Secondary | ICD-10-CM | POA: Diagnosis not present

## 2022-10-06 ENCOUNTER — Other Ambulatory Visit (HOSPITAL_COMMUNITY): Payer: Self-pay

## 2022-10-06 DIAGNOSIS — Z86010 Personal history of colonic polyps: Secondary | ICD-10-CM | POA: Diagnosis not present

## 2022-10-06 DIAGNOSIS — K2100 Gastro-esophageal reflux disease with esophagitis, without bleeding: Secondary | ICD-10-CM | POA: Diagnosis not present

## 2022-10-06 MED ORDER — PANTOPRAZOLE SODIUM 40 MG PO TBEC
40.0000 mg | DELAYED_RELEASE_TABLET | Freq: Every day | ORAL | 3 refills | Status: DC
  Filled 2022-10-06: qty 90, 90d supply, fill #0
  Filled 2023-01-05: qty 90, 90d supply, fill #1

## 2022-10-10 DIAGNOSIS — F411 Generalized anxiety disorder: Secondary | ICD-10-CM | POA: Diagnosis not present

## 2022-10-21 DIAGNOSIS — F411 Generalized anxiety disorder: Secondary | ICD-10-CM | POA: Diagnosis not present

## 2022-11-01 ENCOUNTER — Other Ambulatory Visit (HOSPITAL_COMMUNITY): Payer: Self-pay

## 2022-11-01 ENCOUNTER — Ambulatory Visit: Payer: 59 | Admitting: Nurse Practitioner

## 2022-11-01 ENCOUNTER — Encounter: Payer: Self-pay | Admitting: Nurse Practitioner

## 2022-11-01 VITALS — BP 120/84 | HR 97 | Temp 98.1°F

## 2022-11-01 DIAGNOSIS — A609 Anogenital herpesviral infection, unspecified: Secondary | ICD-10-CM | POA: Diagnosis not present

## 2022-11-01 MED ORDER — VALACYCLOVIR HCL 1 G PO TABS
1000.0000 mg | ORAL_TABLET | Freq: Two times a day (BID) | ORAL | 0 refills | Status: AC
  Filled 2022-11-01: qty 10, 5d supply, fill #0

## 2022-11-01 NOTE — Progress Notes (Signed)
   Acute Office Visit  Subjective:    Patient ID: Ashley Marshall, female    DOB: [birth year], 52 y.o.   MRN: [id number]   HPI 52 y.o. presents today for rash on buttocks. Sunday she began feeling tender around anus and thought it was her hemorrhoids but then yesterday felt bumps on inner right buttock near anus and had flu-like symptoms. Has been under a lot of personal stress recently, also had covid a couple of weeks ago. H/O HSV, has not had outbreak in years. Took Valtrex 500 mg x 3 doses since yesterday.  Patient's last menstrual period was 09/02/2018.    Review of Systems  Constitutional:  Positive for fever (Resolved).  Genitourinary:  Positive for genital sores.       Objective:    Physical Exam Constitutional:      Appearance: Normal appearance.  Genitourinary:      BP 120/84 (BP Location: Right Arm, Patient Position: Sitting, Cuff Size: Normal)   Pulse 97   Temp 98.1 F (36.7 C)   LMP 09/02/2018   SpO2 97%  Wt Readings from Last 3 Encounters:  10/22/21 144 lb (65.3 kg)  06/15/21 143 lb 3.2 oz (65 kg)  04/14/21 146 lb (66.2 kg)        Patient informed chaperone available to be present for breast and/or pelvic exam. Patient has requested no chaperone to be present. Patient has been advised what will be completed during breast and pelvic exam.   Assessment & Plan:   Problem List Items Addressed This Visit   None Visit Diagnoses     HSV (herpes simplex virus) anogenital infection    -  Primary   Relevant Medications   valACYclovir (VALTREX) 1000 MG tablet      Plan: Exam consistent with HSV outbreak. Valtrex 1000 mg BID x 5 days. May use lidocaine gel as needed for symptom management.      Olivia Mackie DNP, 11:48 AM 11/01/2022

## 2022-11-04 DIAGNOSIS — F411 Generalized anxiety disorder: Secondary | ICD-10-CM | POA: Diagnosis not present

## 2022-11-10 ENCOUNTER — Ambulatory Visit (INDEPENDENT_AMBULATORY_CARE_PROVIDER_SITE_OTHER): Payer: 59 | Admitting: Obstetrics & Gynecology

## 2022-11-10 ENCOUNTER — Other Ambulatory Visit (HOSPITAL_COMMUNITY): Payer: Self-pay

## 2022-11-10 ENCOUNTER — Encounter: Payer: Self-pay | Admitting: Obstetrics & Gynecology

## 2022-11-10 VITALS — BP 126/82 | HR 87 | Ht 62.5 in | Wt 145.0 lb

## 2022-11-10 DIAGNOSIS — A609 Anogenital herpesviral infection, unspecified: Secondary | ICD-10-CM

## 2022-11-10 DIAGNOSIS — Z01419 Encounter for gynecological examination (general) (routine) without abnormal findings: Secondary | ICD-10-CM

## 2022-11-10 DIAGNOSIS — Z780 Asymptomatic menopausal state: Secondary | ICD-10-CM

## 2022-11-10 MED ORDER — VALACYCLOVIR HCL 500 MG PO TABS
500.0000 mg | ORAL_TABLET | Freq: Every day | ORAL | 4 refills | Status: DC
  Filled 2022-11-10: qty 90, 90d supply, fill #0
  Filled 2023-01-22: qty 90, 90d supply, fill #1
  Filled 2023-04-19: qty 90, 90d supply, fill #2
  Filled 2023-08-04: qty 90, 90d supply, fill #3
  Filled 2023-10-29: qty 90, 90d supply, fill #4

## 2022-11-10 NOTE — Progress Notes (Signed)
Ashley Marshall Jul 09, 1970 [id number]   History:    52 y.o.  G3P2A1L2 Married.  Vasectomy.  Works at [employer] center.  Daughter 4, son 75.   RP:  Established patient presenting for annual gyn exam    HPI: Postmenopause x 4 years, well on no HRT.  No PMB.  No pelvic pain. Rarely sexually active, no pain with IC.  Pap Neg 10/2020.  No h/o abnormal Pap.  Paps every 3 yrs.  Had a genital HSV recurrence.  Will start on Valacyclovir prophylaxis.  Breasts normal. Screening mammo neg 04/2022. BMI 26.1. Walking for fitness.  Seen by Dr Molli Hazard Urogynecology: Pelvic floor dysfunction, did PT and vaginal Valium, possible mild IC.  Cysto-rectocele with mild stable symptoms, prefers observation. Health labs with Fam MD. Ashley Marshall neg 2021.    Past medical history,surgical history, family history and social history were all reviewed and documented in the EPIC chart.  Gynecologic History Patient's last menstrual period was 09/02/2018.  Obstetric History OB History  Gravida Para Term Preterm AB Living  3 2     1 2   SAB IAB Ectopic Multiple Live Births  1            # Outcome Date GA Lbr Len/2nd Weight Sex Delivery Anes PTL Lv  3 SAB           2 Para           1 Para              ROS: A ROS was performed and pertinent positives and negatives are included in the history. GENERAL: No fevers or chills. HEENT: No change in vision, no earache, sore throat or sinus congestion. NECK: No pain or stiffness. CARDIOVASCULAR: No chest pain or pressure. No palpitations. PULMONARY: No shortness of breath, cough or wheeze. GASTROINTESTINAL: No abdominal pain, nausea, vomiting or diarrhea, melena or bright red blood per rectum. GENITOURINARY: No urinary frequency, urgency, hesitancy or dysuria. MUSCULOSKELETAL: No joint or muscle pain, no back pain, no recent trauma. DERMATOLOGIC: No rash, no itching, no lesions. ENDOCRINE: No polyuria, polydipsia, no heat or cold intolerance. No recent change in weight.  HEMATOLOGICAL: No anemia or easy bruising or bleeding. NEUROLOGIC: No headache, seizures, numbness, tingling or weakness. PSYCHIATRIC: No depression, no loss of interest in normal activity or change in sleep pattern.     Exam:   BP 126/82 (BP Location: Right Arm, Patient Position: Sitting, Cuff Size: Normal)   Pulse 87   Ht 5' 2.5" (1.588 m)   Wt 145 lb (65.8 kg)   LMP 09/02/2018   SpO2 98%   BMI 26.10 kg/m   Body mass index is 26.1 kg/m.  General appearance : Well developed well nourished female. No acute distress HEENT: Eyes: no retinal hemorrhage or exudates,  Neck supple, trachea midline, no carotid bruits, no thyroidmegaly Lungs: Clear to auscultation, no rhonchi or wheezes, or rib retractions  Heart: Regular rate and rhythm, no murmurs or gallops Breast:Examined in sitting and supine position were symmetrical in appearance, no palpable masses or tenderness,  no skin retraction, no nipple inversion, no nipple discharge, no skin discoloration, no axillary or supraclavicular lymphadenopathy Abdomen: no palpable masses or tenderness, no rebound or guarding Extremities: no edema or skin discoloration or tenderness  Pelvic: Vulva: Normal             Vagina: No gross lesions or discharge  Cervix: No gross lesions or discharge  Uterus  RV, normal size, shape and consistency, non-tender  and mobile  Adnexa  Without masses or tenderness  Anus: Normal   Assessment/Plan:  52 y.o. female for annual exam   1. Well female exam with routine gynecological exam Postmenopause x 4 years, well on no HRT.  No PMB.  No pelvic pain. Rarely sexually active, no pain with IC.  Pap Neg 10/2020.  No h/o abnormal Pap.  Paps every 3 yrs.  Had a genital HSV recurrence.  Will start on Valacyclovir prophylaxis.  Breasts normal. Screening mammo neg 04/2022. BMI 26.1. Walking for fitness.  Seen by Dr Molli Hazard Urogynecology: Pelvic floor dysfunction, did PT and vaginal Valium, possible mild IC.  Cysto-rectocele  with mild stable symptoms, prefers observation. Health labs with Fam MD. Ashley Marshall neg 2021.  2. Postmenopause Postmenopause x 4 years, well on no HRT.  No PMB.  No pelvic pain. Rarely sexually active, no pain with IC.    3. HSV (herpes simplex virus) anogenital infection Had a genital HSV recurrence.  Will start on Valacyclovir prophylaxis.   Other orders - valACYclovir (VALTREX) 500 MG tablet; Take 1 tablet (500 mg total) by mouth daily. Prophylaxis   Genia Del MD, 9:14 AM

## 2022-11-16 ENCOUNTER — Encounter: Payer: Self-pay | Admitting: Obstetrics & Gynecology

## 2022-11-18 DIAGNOSIS — F411 Generalized anxiety disorder: Secondary | ICD-10-CM | POA: Diagnosis not present

## 2022-11-21 ENCOUNTER — Other Ambulatory Visit (HOSPITAL_COMMUNITY): Payer: Self-pay

## 2022-12-07 ENCOUNTER — Ambulatory Visit (INDEPENDENT_AMBULATORY_CARE_PROVIDER_SITE_OTHER): Payer: 59 | Admitting: Orthopedic Surgery

## 2022-12-07 ENCOUNTER — Other Ambulatory Visit (INDEPENDENT_AMBULATORY_CARE_PROVIDER_SITE_OTHER): Payer: 59

## 2022-12-07 DIAGNOSIS — M546 Pain in thoracic spine: Secondary | ICD-10-CM

## 2022-12-09 ENCOUNTER — Ambulatory Visit
Admission: RE | Admit: 2022-12-09 | Discharge: 2022-12-09 | Disposition: A | Discharge status: Home / Self Care | Payer: 59 | Source: Ambulatory Visit | Attending: Orthopedic Surgery | Admitting: Orthopedic Surgery

## 2022-12-09 ENCOUNTER — Encounter: Payer: Self-pay | Admitting: Orthopedic Surgery

## 2022-12-09 ENCOUNTER — Telehealth: Payer: Self-pay | Admitting: Orthopedic Surgery

## 2022-12-09 DIAGNOSIS — R918 Other nonspecific abnormal finding of lung field: Secondary | ICD-10-CM | POA: Diagnosis not present

## 2022-12-09 DIAGNOSIS — M546 Pain in thoracic spine: Secondary | ICD-10-CM

## 2022-12-09 NOTE — Progress Notes (Signed)
Office Visit Note   Patient: Ashley Marshall           Date of Birth: 1971/04/30           MRN: [id number] Visit Date: 12/07/2022 Requested by: Shon Hale, MD [address] [place],  [place] [address] PCP: Shon Hale, MD  Subjective: Chief Complaint  Patient presents with   Other     Pain under scapula after masssage on 11/22/22    HPI: Ashley Marshall is a 52 y.o. female who presents to the office reporting back pain on the left side since 11/22/2022.  Describes thoracic pain on that left side.  Went for massage.  Had a very vigorous massage where the therapist was pushing away from her thoracic spine.  Localizes the pain around T8.  Usually goes to the gym with her spouse but has not felt up to it since this event.  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  Describes some difficulty sleeping.  No radicular symptoms and no numbness and tingling in the lower extremities.  She has tried ice and heat with minimal relief.  Takes Tylenol and ibuprofen essentially around-the-clock.  She states is not really getting any better.  She is okay with deep breathing.  States the area of pain is about the size of her fist.  The knifelike pain does occasionally radiate to the front..                ROS: All systems reviewed are negative as they relate to the chief complaint within the history of present illness.  Patient denies fevers or chills.  Assessment & Plan: Visit Diagnoses:  1. Pain in thoracic spine     Plan: Impression is atypical thoracic spine pain following massage.  Differential diagnosis would be some type of rib injury versus muscle strain/pull.  I would expect some improvement though with soft tissue injury over 2 weeks.  Plan at this time his 2 views thoracic spine are unremarkable.  No obvious rib fracture.  No CPR for 6 weeks.  She needs a CT scan of her thoracic spine to evaluate that left-sided posterior thoracic go rib  junction area around the T8 level.  Follow-up after that study.  Follow-Up Instructions: No follow-ups on file.   Orders:  Orders Placed This Encounter  Procedures   XR Thoracic Spine 2 View   CT THORACIC SPINE WO CONTRAST   No orders of the defined types were placed in this encounter.     Procedures: No procedures performed   Clinical Data: No additional findings.  Objective: Vital Signs: LMP 09/02/2018   Physical Exam:  Constitutional: Patient appears well-developed HEENT:  Head: Normocephalic Eyes:EOM are normal Neck: Normal range of motion Cardiovascular: Normal rate Pulmonary/chest: Effort normal Neurologic: Patient is alert Skin: Skin is warm Psychiatric: Patient has normal mood and affect  Ortho Exam: Ortho exam demonstrates negative Minsky negative clonus in the lower extremities.  Ankle dorsiflexion plantarflexion hip flexion and extension of the leg intact.  Does have some mild pain with forward bending.  I do not detect any rib instability.  No paresthesias in the thoracic or lumbar distribution.  Specialty Comments:  No specialty comments available.  Imaging: No results found.   PMFS History: There are no problems to display for this patient.  Past Medical History:  Diagnosis Date   Constipation    GERD (gastroesophageal reflux disease)    no meds -  diet controlled   Herpes    History of kidney stones    passed stones - no surgery required   HPV in female    Hyperlipidemia    diet controlled - no meds   Missed ab    no surgery required   Seasonal allergies    SVD (spontaneous vaginal delivery)    x 2    Family History  Problem Relation Age of Onset   Diabetes Father    Breast cancer Paternal Aunt    Cancer Paternal Uncle        pancreatic   Diabetes Paternal Grandmother    Cancer Paternal Grandmother        stomach    Cancer Paternal Grandfather        colon    Past Surgical History:  Procedure Laterality Date   CATARACT  EXTRACTION Bilateral    COLONOSCOPY     x 2 -hx polyps   COLPOSCOPY     DILATATION & CURETTAGE/HYSTEROSCOPY WITH MYOSURE N/A 11/04/2016   Procedure: DILATATION & CURETTAGE/HYSTEROSCOPY WITH MYOSURE;  Surgeon: Genia Del, MD;  Location: [facility];  Service: Gynecology;  Laterality: N/A;   RETINAL DETACHMENT SURGERY Left 2000   ROBOTIC ASSISTED LAPAROSCOPIC OVARIAN CYSTECTOMY Left 11/04/2016   Procedure: ROBOTIC ASSISTED LAPAROSCOPIC OVARIAN CYSTECTOMY;  Surgeon: Genia Del, MD;  Location: [facility];  Service: Gynecology;  Laterality: Left;  RNFA confirmed on 10/26/16 with chass   ROBOTIC ASSISTED SALPINGO OOPHERECTOMY Right 11/04/2016   Procedure: ROBOTIC ASSISTED SALPINGO OOPHORECTOMY;  Surgeon: Genia Del, MD;  Location: [facility];  Service: Gynecology;  Laterality: Right;   WISDOM TOOTH EXTRACTION     Social History   Occupational History   Not on file  Tobacco Use   Smoking status: Never   Smokeless tobacco: Never  Vaping Use   Vaping status: Never Used  Substance and Sexual Activity   Alcohol use: Yes    Alcohol/week: 1.0 standard drink of alcohol    Types: 1 Glasses of wine per week   Drug use: No   Sexual activity: Not Currently    Partners: Male    Birth control/protection: Post-menopausal    Comment: 1st intercourse- 22, partners- 2

## 2022-12-09 NOTE — Telephone Encounter (Signed)
Patient called. Says she is having tightness and her back feels like something is crawling inside. Also would like to know if there are any restrictions?

## 2022-12-09 NOTE — Telephone Encounter (Signed)
Looks like Dr. August Saucer saw her the other day, I cannot see any information about her

## 2022-12-12 ENCOUNTER — Other Ambulatory Visit (HOSPITAL_COMMUNITY): Payer: Self-pay

## 2022-12-12 ENCOUNTER — Other Ambulatory Visit: Payer: Self-pay | Admitting: Orthopedic Surgery

## 2022-12-12 MED ORDER — METHOCARBAMOL 500 MG PO TABS
500.0000 mg | ORAL_TABLET | Freq: Three times a day (TID) | ORAL | 0 refills | Status: AC | PRN
  Filled 2022-12-12: qty 30, 10d supply, fill #0

## 2022-12-12 NOTE — Telephone Encounter (Signed)
I sent in Robaxin.  Also I called her and left message

## 2022-12-13 ENCOUNTER — Other Ambulatory Visit (HOSPITAL_COMMUNITY): Payer: Self-pay

## 2022-12-13 DIAGNOSIS — F411 Generalized anxiety disorder: Secondary | ICD-10-CM | POA: Diagnosis not present

## 2022-12-14 ENCOUNTER — Other Ambulatory Visit: Payer: 59

## 2022-12-15 ENCOUNTER — Ambulatory Visit (INDEPENDENT_AMBULATORY_CARE_PROVIDER_SITE_OTHER): Payer: 59 | Admitting: Orthopedic Surgery

## 2022-12-15 ENCOUNTER — Encounter: Payer: Self-pay | Admitting: Orthopedic Surgery

## 2022-12-15 ENCOUNTER — Telehealth: Payer: Self-pay

## 2022-12-15 DIAGNOSIS — M546 Pain in thoracic spine: Secondary | ICD-10-CM

## 2022-12-15 NOTE — Progress Notes (Signed)
Office Visit Note   Patient: Ashley Marshall           Date of Birth: 1971-04-20           MRN: [id number] Visit Date: 12/15/2022 Requested by: Shon Hale, MD [address] [place],  [place] [address] PCP: Shon Hale, MD  Subjective: Chief Complaint  Patient presents with   OTHER     Scan review    HPI: Ashley Marshall is a 52 y.o. female who presents to the office reporting back pain after massage.  Since she was last seen she had a CT scan of the thoracic spine which shows no bony abnormality particularly around the rib vertebral body junction on the left.  Had some tightness this week but overall she is symptomatically making gradual improvement.  CT scan is reviewed and it does show no bony abnormality but possible aspiration pneumonia in the right middle lobe which is resolving.  She is taking Protonix.  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                ROS: All systems reviewed are negative as they relate to the chief complaint within the history of present illness.  Patient denies fevers or chills.  Assessment & Plan: Visit Diagnoses:  1. Pain in thoracic spine     Plan: Impression is no bony problem in the thoracic spine.  This is a soft tissue injury which should resolve over time.  Regarding the groundglass appearance in that right middle lobe we reviewed the scan ourselves and findings are not entirely clear.  Recommended that she have her primary care physician or Dr. Shirline Frees review the scan and decide if further imaging in 3 months is indicated.  Follow-Up Instructions: No follow-ups on file.   Orders:  No orders of the defined types were placed in this encounter.  No orders of the defined types were placed in this encounter.     Procedures: No procedures performed   Clinical Data: No additional findings.  Objective: Vital Signs: LMP 09/02/2018   Physical Exam:  Constitutional: Patient appears  well-developed HEENT:  Head: Normocephalic Eyes:EOM are normal Neck: Normal range of motion Cardiovascular: Normal rate Pulmonary/chest: Effort normal Neurologic: Patient is alert Skin: Skin is warm Psychiatric: Patient has normal mood and affect  Ortho Exam: Ortho exam demonstrates normal rotation and forward flexion with mild tenderness off midline on the left-hand side around T8.  No difficulty with deep inspiration.  No swelling or bruising in this area.  Specialty Comments:  No specialty comments available.  Imaging: No results found.   PMFS History: There are no problems to display for this patient.  Past Medical History:  Diagnosis Date   Constipation    GERD (gastroesophageal reflux disease)    no meds - diet controlled   Herpes    History of kidney stones    passed stones - no surgery required   HPV in female    Hyperlipidemia    diet controlled - no meds   Missed ab    no surgery required   Seasonal allergies    SVD (spontaneous vaginal delivery)    x 2    Family History  Problem Relation Age of Onset   Diabetes Father    Breast cancer Paternal Aunt    Cancer Paternal Uncle        pancreatic   Diabetes Paternal Grandmother    Cancer Paternal Grandmother  stomach    Cancer Paternal Grandfather        colon    Past Surgical History:  Procedure Laterality Date   CATARACT EXTRACTION Bilateral    COLONOSCOPY     x 2 -hx polyps   COLPOSCOPY     DILATATION & CURETTAGE/HYSTEROSCOPY WITH MYOSURE N/A 11/04/2016   Procedure: DILATATION & CURETTAGE/HYSTEROSCOPY WITH MYOSURE;  Surgeon: Genia Del, MD;  Location: [facility];  Service: Gynecology;  Laterality: N/A;   RETINAL DETACHMENT SURGERY Left 2000   ROBOTIC ASSISTED LAPAROSCOPIC OVARIAN CYSTECTOMY Left 11/04/2016   Procedure: ROBOTIC ASSISTED LAPAROSCOPIC OVARIAN CYSTECTOMY;  Surgeon: Genia Del, MD;  Location: [facility];  Service: Gynecology;  Laterality: Left;  RNFA confirmed on 10/26/16  with chass   ROBOTIC ASSISTED SALPINGO OOPHERECTOMY Right 11/04/2016   Procedure: ROBOTIC ASSISTED SALPINGO OOPHORECTOMY;  Surgeon: Genia Del, MD;  Location: [facility];  Service: Gynecology;  Laterality: Right;   WISDOM TOOTH EXTRACTION     Social History   Occupational History   Not on file  Tobacco Use   Smoking status: Never   Smokeless tobacco: Never  Vaping Use   Vaping status: Never Used  Substance and Sexual Activity   Alcohol use: Yes    Alcohol/week: 1.0 standard drink of alcohol    Types: 1 Glasses of wine per week   Drug use: No   Sexual activity: Not Currently    Partners: Male    Birth control/protection: Post-menopausal    Comment: 1st intercourse- 22, partners- 2

## 2022-12-15 NOTE — Telephone Encounter (Signed)
[facility] wanted to let Dr. August Saucer know about CT Results of T-Spine.  Please advise.  Thank you.

## 2022-12-21 ENCOUNTER — Ambulatory Visit: Payer: 59 | Admitting: Orthopedic Surgery

## 2022-12-22 DIAGNOSIS — E7800 Pure hypercholesterolemia, unspecified: Secondary | ICD-10-CM | POA: Diagnosis not present

## 2022-12-22 DIAGNOSIS — R942 Abnormal results of pulmonary function studies: Secondary | ICD-10-CM | POA: Diagnosis not present

## 2022-12-22 DIAGNOSIS — R7301 Impaired fasting glucose: Secondary | ICD-10-CM | POA: Diagnosis not present

## 2022-12-22 DIAGNOSIS — E559 Vitamin D deficiency, unspecified: Secondary | ICD-10-CM | POA: Diagnosis not present

## 2022-12-22 DIAGNOSIS — Z800 Family history of malignant neoplasm of digestive organs: Secondary | ICD-10-CM | POA: Diagnosis not present

## 2022-12-22 DIAGNOSIS — Z0000 Encounter for general adult medical examination without abnormal findings: Secondary | ICD-10-CM | POA: Diagnosis not present

## 2022-12-22 DIAGNOSIS — Z79899 Other long term (current) drug therapy: Secondary | ICD-10-CM | POA: Diagnosis not present

## 2023-01-05 ENCOUNTER — Other Ambulatory Visit (HOSPITAL_COMMUNITY): Payer: Self-pay

## 2023-01-06 ENCOUNTER — Other Ambulatory Visit (HOSPITAL_COMMUNITY): Payer: Self-pay

## 2023-01-13 DIAGNOSIS — F411 Generalized anxiety disorder: Secondary | ICD-10-CM | POA: Diagnosis not present

## 2023-01-19 ENCOUNTER — Ambulatory Visit (INDEPENDENT_AMBULATORY_CARE_PROVIDER_SITE_OTHER): Payer: 59 | Admitting: Surgical

## 2023-01-19 ENCOUNTER — Other Ambulatory Visit (INDEPENDENT_AMBULATORY_CARE_PROVIDER_SITE_OTHER): Payer: 59

## 2023-01-19 DIAGNOSIS — M546 Pain in thoracic spine: Secondary | ICD-10-CM

## 2023-01-19 DIAGNOSIS — M542 Cervicalgia: Secondary | ICD-10-CM | POA: Diagnosis not present

## 2023-01-20 ENCOUNTER — Encounter: Payer: Self-pay | Admitting: Orthopedic Surgery

## 2023-01-20 NOTE — Progress Notes (Signed)
Follow-up Office Visit Note   Patient: Ashley Marshall           Date of Birth: Feb 18, 1971           MRN: [id number] Visit Date: 01/19/2023 Requested by: Shon Hale, MD [address] [place],  [place] [address] PCP: Shon Hale, MD  Subjective: Chief Complaint  Patient presents with   Other     Cervical and thoracic pain    HPI: Ashley Marshall is a 52 y.o. female who returns to the office for follow-up visit.    Plan at last visit was: Impression is no bony problem in the thoracic spine. This is a soft tissue injury which should resolve over time. Regarding the groundglass appearance in that right middle lobe we reviewed the scan ourselves and findings are not entirely clear. Recommended that she have her primary care physician or Dr. Shirline Frees review the scan and decide if further imaging in 3 months is indicated.   Since then, patient notes she has had no improvement in pain.  She feels that over the last month pain is actually gotten worse and more noticeable.  She has noticed that in the last 4 to 5 weeks she has had increased tightness in her cervical spine region.  The majority of her pain seems to be on the left in the mid thoracic spine with a "shocking sensation" that originates from this location.  She will have some radiation of pain up her spine into the cervical spine.  She has occasional headaches and neck pain do bother her at the base of the skull and throughout the axial cervical spine.  She denies any radicular pain down her arms or legs or weakness.  Does describe a "skin crawling sensation" through her thoracic spine and is hard for her to say if this goes into her chest wall at all.  No history of prior spine surgery.  This all started after a massage.  Worse with activity especially with spine flexion.  She will have alternating burning pain and a "crushing pressure" pain through the cervical and thoracic spine regions.  Pain will wake her up  pretty much every night.  She has been able to work but she cannot enjoy activities she normally would like to do such as exercising with her husband.  No vision changes.              ROS: All systems reviewed are negative as they relate to the chief complaint within the history of present illness.  Patient denies fevers or chills.  Assessment & Plan: Visit Diagnoses:  1. Neck pain   2. Pain in thoracic spine     Plan: Ashley Marshall is a 52 y.o. female who returns to the office for follow-up visit.  Plan from last visit was noted above in HPI.  They now return with no relief or improvement since last visit.  In fact her pain seems worse and more diffuse.  Most of her pain still seems to be localizing to that left mid thoracic region but she now has some pain that radiates into the neck over the last 5 weeks and pain that is reliably reproduced with neck range of motion.  She has had CT scan of the thoracic spine demonstrating no significant bony abnormality.  Does have some aspects of the description to her pain that are concerning for possible nerve involvement from herniated thoracic disc versus herniated cervical disc versus stenosis of  the cervical/thoracic spine including burning sensation, shocking sensation, skin crawling sensation.  With failure of improvement over the last 2 months and negative CT scan of the thoracic spine demonstrating no bony abnormality, need MRI of the cervical and thoracic spines to further evaluate for herniated disc versus stenosis.  She does have radiographs of the cervical spine demonstrating loss of lordosis and moderate degenerative changes at C5-C6.  Plan to follow-up with Dr. August Saucer after MRI scans.  Follow-Up Instructions: No follow-ups on file.   Orders:  Orders Placed This Encounter  Procedures   XR Cervical Spine 2 or 3 views   MR Cervical Spine w/o contrast   MR Thoracic Spine w/o contrast   No orders of the defined types were placed in this  encounter.     Procedures: No procedures performed   Clinical Data: No additional findings.  Objective: Vital Signs: LMP 09/02/2018   Physical Exam:  Constitutional: Patient appears well-developed HEENT:  Head: Normocephalic Eyes:EOM are normal Neck: Normal range of motion Cardiovascular: Normal rate Pulmonary/chest: Effort normal Neurologic: Patient is alert Skin: Skin is warm Psychiatric: Patient has normal mood and affect  Ortho Exam: Ortho exam demonstrates thoracic spine with no deformity noted.  Maximal area of pain is localized around the level of T8 on the left.  There is no tenderness along the thoracic spine but pain is exacerbated with spine flexion and extension.  She has negative Spurling sign.  Negative Lhermitte sign.  Excellent strength of EPL, FPL, finger abduction, pronation/sedation, grip strength, bicep flexion, tricep extension, deltoid abduction.  Excellent rotator cuff strength bilaterally.  She has full cervical spine range of motion but looking up and looking down toward the floor especially cause her increased pain.  Specialty Comments:  No specialty comments available.  Imaging: No results found.   PMFS History: There are no problems to display for this patient.  Past Medical History:  Diagnosis Date   Constipation    GERD (gastroesophageal reflux disease)    no meds - diet controlled   Herpes    History of kidney stones    passed stones - no surgery required   HPV in female    Hyperlipidemia    diet controlled - no meds   Missed ab    no surgery required   Seasonal allergies    SVD (spontaneous vaginal delivery)    x 2    Family History  Problem Relation Age of Onset   Diabetes Father    Breast cancer Paternal Aunt    Cancer Paternal Uncle        pancreatic   Diabetes Paternal Grandmother    Cancer Paternal Grandmother        stomach    Cancer Paternal Grandfather        colon    Past Surgical History:  Procedure  Laterality Date   CATARACT EXTRACTION Bilateral    COLONOSCOPY     x 2 -hx polyps   COLPOSCOPY     DILATATION & CURETTAGE/HYSTEROSCOPY WITH MYOSURE N/A 11/04/2016   Procedure: DILATATION & CURETTAGE/HYSTEROSCOPY WITH MYOSURE;  Surgeon: Genia Del, MD;  Location: [facility];  Service: Gynecology;  Laterality: N/A;   RETINAL DETACHMENT SURGERY Left 2000   ROBOTIC ASSISTED LAPAROSCOPIC OVARIAN CYSTECTOMY Left 11/04/2016   Procedure: ROBOTIC ASSISTED LAPAROSCOPIC OVARIAN CYSTECTOMY;  Surgeon: Genia Del, MD;  Location: [facility];  Service: Gynecology;  Laterality: Left;  RNFA confirmed on 10/26/16 with chass   ROBOTIC ASSISTED SALPINGO OOPHERECTOMY Right 11/04/2016   Procedure:  ROBOTIC ASSISTED SALPINGO OOPHORECTOMY;  Surgeon: Genia Del, MD;  Location: [facility];  Service: Gynecology;  Laterality: Right;   WISDOM TOOTH EXTRACTION     Social History   Occupational History   Not on file  Tobacco Use   Smoking status: Never   Smokeless tobacco: Never  Vaping Use   Vaping status: Never Used  Substance and Sexual Activity   Alcohol use: Yes    Alcohol/week: 1.0 standard drink of alcohol    Types: 1 Glasses of wine per week   Drug use: No   Sexual activity: Not Currently    Partners: Male    Birth control/protection: Post-menopausal    Comment: 1st intercourse- 22, partners- 2

## 2023-01-24 ENCOUNTER — Encounter: Payer: Self-pay | Admitting: Surgical

## 2023-01-25 ENCOUNTER — Ambulatory Visit
Admission: RE | Admit: 2023-01-25 | Discharge: 2023-01-25 | Disposition: A | Discharge status: Home / Self Care | Payer: 59 | Source: Ambulatory Visit | Attending: Surgical | Admitting: Surgical

## 2023-01-25 ENCOUNTER — Ambulatory Visit
Admission: RE | Admit: 2023-01-25 | Discharge: 2023-01-25 | Disposition: A | Discharge status: Home / Self Care | Payer: 59 | Source: Ambulatory Visit | Attending: Orthopedic Surgery | Admitting: Orthopedic Surgery

## 2023-01-25 DIAGNOSIS — M542 Cervicalgia: Secondary | ICD-10-CM | POA: Diagnosis not present

## 2023-01-25 DIAGNOSIS — M546 Pain in thoracic spine: Secondary | ICD-10-CM

## 2023-01-27 ENCOUNTER — Other Ambulatory Visit (HOSPITAL_COMMUNITY): Payer: Self-pay

## 2023-01-31 ENCOUNTER — Other Ambulatory Visit (HOSPITAL_COMMUNITY): Payer: Self-pay

## 2023-02-03 DIAGNOSIS — F411 Generalized anxiety disorder: Secondary | ICD-10-CM | POA: Diagnosis not present

## 2023-02-06 NOTE — Progress Notes (Signed)
Follows up with you on Wednesday

## 2023-02-08 ENCOUNTER — Encounter: Payer: Self-pay | Admitting: Orthopedic Surgery

## 2023-02-08 ENCOUNTER — Ambulatory Visit (INDEPENDENT_AMBULATORY_CARE_PROVIDER_SITE_OTHER): Payer: 59 | Admitting: Orthopedic Surgery

## 2023-02-08 DIAGNOSIS — M546 Pain in thoracic spine: Secondary | ICD-10-CM | POA: Diagnosis not present

## 2023-02-08 NOTE — Progress Notes (Signed)
Office Visit Note   Patient: Ashley Marshall           Date of Birth: 03/14/1971           MRN: [id number] Visit Date: 02/08/2023 Requested by: Shon Hale, MD [address] [place],  [place] [address] PCP: Shon Hale, MD  Subjective: Chief Complaint  Patient presents with   Other     Review MRI of cervical and thoracic spine    HPI: Ashley Marshall is a 52 y.o. female who presents to the office reporting continued middle thoracic pain.  All this started after massage several months ago.  Describes burning stinging sensation on the skin on the left-hand side of her mid thoracic spine region.  Sometimes she is okay.  Did report some right arm symptoms as well on 1 occasion this past weekend.  Patient states that her symptoms remain "there" in the mid back every day.  Did take Tylenol on Sunday.  Since she was last seen she has had an MRI scan of the cervical spine as well as the thoracic spine.  MRI thoracic spine shows no significant canal or foraminal stenosis but there is borderline small syrinx versus anatomic variant in the central canal in the thoracic cord as well as a T8 hemangioma.  Recommendation is for thoracic spine MRI in 4 to 6 months..                ROS: All systems reviewed are negative as they relate to the chief complaint within the history of present illness.  Patient denies fevers or chills.  Assessment & Plan: Visit Diagnoses:  1. Pain in thoracic spine     Plan: Impression is persistent symptoms in the thoracic spine following massage injury.  I do not see any rash indicating shingles.  Structurally the neck and the back look good enough for exercises and unrestricted activity.  I think we just have to keep an eye on her symptoms which seem to be improving albeit marginally over the past several months.  MRI T-spine to evaluate hemangioma ordered for 6 months.  Follow-Up Instructions: No follow-ups on file.   Orders:  Orders Placed  This Encounter  Procedures   MR Thoracic Spine w/o contrast   No orders of the defined types were placed in this encounter.     Procedures: No procedures performed   Clinical Data: No additional findings.  Objective: Vital Signs: LMP 09/02/2018   Physical Exam:  Constitutional: Patient appears well-developed HEENT:  Head: Normocephalic Eyes:EOM are normal Neck: Normal range of motion Cardiovascular: Normal rate Pulmonary/chest: Effort normal Neurologic: Patient is alert Skin: Skin is warm Psychiatric: Patient has normal mood and affect  Ortho Exam: Ortho exam demonstrates no rashes on the skin in the back.  She has good motor strength in the lower and upper extremities.  Minimal pain with forward and lateral bending.  Specialty Comments:  No specialty comments available.  Imaging: No results found.   PMFS History: There are no problems to display for this patient.  Past Medical History:  Diagnosis Date   Constipation    GERD (gastroesophageal reflux disease)    no meds - diet controlled   Herpes    History of kidney stones    passed stones - no surgery required   HPV in female    Hyperlipidemia    diet controlled - no meds   Missed ab    no surgery required   Seasonal allergies  SVD (spontaneous vaginal delivery)    x 2    Family History  Problem Relation Age of Onset   Diabetes Father    Breast cancer Paternal Aunt    Cancer Paternal Uncle        pancreatic   Diabetes Paternal Grandmother    Cancer Paternal Grandmother        stomach    Cancer Paternal Grandfather        colon    Past Surgical History:  Procedure Laterality Date   CATARACT EXTRACTION Bilateral    COLONOSCOPY     x 2 -hx polyps   COLPOSCOPY     DILATATION & CURETTAGE/HYSTEROSCOPY WITH MYOSURE N/A 11/04/2016   Procedure: DILATATION & CURETTAGE/HYSTEROSCOPY WITH MYOSURE;  Surgeon: Genia Del, MD;  Location: [facility];  Service: Gynecology;  Laterality: N/A;   RETINAL  DETACHMENT SURGERY Left 2000   ROBOTIC ASSISTED LAPAROSCOPIC OVARIAN CYSTECTOMY Left 11/04/2016   Procedure: ROBOTIC ASSISTED LAPAROSCOPIC OVARIAN CYSTECTOMY;  Surgeon: Genia Del, MD;  Location: [facility];  Service: Gynecology;  Laterality: Left;  RNFA confirmed on 10/26/16 with chass   ROBOTIC ASSISTED SALPINGO OOPHERECTOMY Right 11/04/2016   Procedure: ROBOTIC ASSISTED SALPINGO OOPHORECTOMY;  Surgeon: Genia Del, MD;  Location: [facility];  Service: Gynecology;  Laterality: Right;   WISDOM TOOTH EXTRACTION     Social History   Occupational History   Not on file  Tobacco Use   Smoking status: Never   Smokeless tobacco: Never  Vaping Use   Vaping status: Never Used  Substance and Sexual Activity   Alcohol use: Yes    Alcohol/week: 1.0 standard drink of alcohol    Types: 1 Glasses of wine per week   Drug use: No   Sexual activity: Not Currently    Partners: Male    Birth control/protection: Post-menopausal    Comment: 1st intercourse- 22, partners- 2

## 2023-02-16 ENCOUNTER — Other Ambulatory Visit: Payer: Self-pay | Admitting: Family Medicine

## 2023-02-16 DIAGNOSIS — Z1231 Encounter for screening mammogram for malignant neoplasm of breast: Secondary | ICD-10-CM

## 2023-02-17 ENCOUNTER — Encounter: Payer: Self-pay | Admitting: Orthopedic Surgery

## 2023-02-17 DIAGNOSIS — M546 Pain in thoracic spine: Secondary | ICD-10-CM

## 2023-02-17 NOTE — Telephone Encounter (Signed)
Yes  thx

## 2023-02-23 ENCOUNTER — Encounter: Payer: Self-pay | Admitting: Neurology

## 2023-03-01 DIAGNOSIS — F411 Generalized anxiety disorder: Secondary | ICD-10-CM | POA: Diagnosis not present

## 2023-03-06 ENCOUNTER — Other Ambulatory Visit (HOSPITAL_COMMUNITY): Payer: Self-pay

## 2023-03-06 ENCOUNTER — Other Ambulatory Visit: Payer: Self-pay

## 2023-03-06 ENCOUNTER — Other Ambulatory Visit (HOSPITAL_BASED_OUTPATIENT_CLINIC_OR_DEPARTMENT_OTHER): Payer: Self-pay

## 2023-03-07 ENCOUNTER — Other Ambulatory Visit (HOSPITAL_COMMUNITY): Payer: Self-pay

## 2023-03-07 MED ORDER — SOOLANTRA 1 % EX CREA
TOPICAL_CREAM | CUTANEOUS | 3 refills | Status: AC
Start: 2023-03-07 — End: ?
  Filled 2023-03-07 – 2024-02-21 (×3): qty 45, 30d supply, fill #0

## 2023-03-13 ENCOUNTER — Other Ambulatory Visit: Payer: Self-pay | Admitting: Family Medicine

## 2023-03-13 ENCOUNTER — Encounter: Payer: Self-pay | Admitting: Family Medicine

## 2023-03-13 DIAGNOSIS — R942 Abnormal results of pulmonary function studies: Secondary | ICD-10-CM

## 2023-03-16 ENCOUNTER — Other Ambulatory Visit: Payer: 59

## 2023-03-16 ENCOUNTER — Ambulatory Visit
Admission: RE | Admit: 2023-03-16 | Discharge: 2023-03-16 | Disposition: A | Discharge status: Home / Self Care | Payer: 59 | Source: Ambulatory Visit | Attending: Family Medicine | Admitting: Family Medicine

## 2023-03-16 ENCOUNTER — Other Ambulatory Visit: Payer: Self-pay

## 2023-03-16 DIAGNOSIS — Z8616 Personal history of COVID-19: Secondary | ICD-10-CM | POA: Diagnosis not present

## 2023-03-16 DIAGNOSIS — R942 Abnormal results of pulmonary function studies: Secondary | ICD-10-CM

## 2023-03-16 DIAGNOSIS — K5732 Diverticulitis of large intestine without perforation or abscess without bleeding: Secondary | ICD-10-CM | POA: Diagnosis not present

## 2023-03-16 DIAGNOSIS — R1032 Left lower quadrant pain: Secondary | ICD-10-CM | POA: Diagnosis not present

## 2023-03-17 ENCOUNTER — Other Ambulatory Visit (HOSPITAL_COMMUNITY): Payer: Self-pay

## 2023-03-17 MED ORDER — AMOXICILLIN-POT CLAVULANATE 875-125 MG PO TABS
1.0000 | ORAL_TABLET | Freq: Two times a day (BID) | ORAL | 0 refills | Status: DC
  Filled 2023-03-17: qty 20, 10d supply, fill #0

## 2023-03-20 NOTE — Progress Notes (Signed)
Initial neurology clinic note  Reason for Evaluation: Consultation requested by Cammy Copa, MD for an opinion regarding thoracic pain. My final recommendations will be communicated back to the requesting physician by way of shared medical record or letter to requesting physician via [place] mail.  HPI: This is Ms. Janan Ridge, a 52 y.o. female who presents to neurology clinic with the chief complaint of discomfort on left back. The patient is alone today.  Patient went to get a message on 11/22/2022. She had tension in her shoulders, so was getting a relaxation message. She got a deep tissue message and was pushing very hard near her mid back. She was told she had some muscle tension in that area. She was sore after the message and felt chest pain on the drive home from the message. She would feel discomfort then soon started having an usual feeling on the left mid back (~T7-T8 level). She feeling tingling, burning, crawling sensation, like something is on her back or rolling.   Dr. August Saucer was concerned for a rib fracture, but there was no fracture. She was very active and going to the gym, but despite doing half the weight, she feels the discomfort in the back. Since onset, she thinks symptoms may have improved. She recently had to re-certify CPR and this stirred up symptoms again. It has calmed down some again.  She has tried heat and ice. She will take ibuprofen or tylenol and do not get significant relief.  She had some intermittent electric shocks on the right side of her back in the same area (2-3 times today).   She does not have any numbness or tingling in arms or legs and denies weakness.  She has never previously had focal neurologic symptoms, including vision loss.  MRI cervical and thoracic spine wo contrast on 01/25/23 showed a mildly prominent central canal in thoracic cord measuring up to 1-2 mm and borderline small syrinx versus anatomic variant. There was also an atypical  T1 hypointense T8 bone lesion for which a follow up MRI thoracic spine was recommended in 4-6 months for stability.  The patient has not noticed any recent skin rashes nor does she report any constitutional symptoms like night sweats, anorexia or unintentional weight loss. She recently had diverticulitis and did have some fevers with that.   MEDICATIONS:  Outpatient Encounter Medications as of 03/30/2023  Medication Sig   acetaminophen (TYLENOL) 500 MG tablet Take 500 mg by mouth every 6 (six) hours as needed.   bifidobacterium infantis (ALIGN) capsule See admin instructions.   cycloSPORINE (RESTASIS) 0.05 % ophthalmic emulsion INSTILL 1 DROP INTO BOTH EYES TWICE DAILY   cycloSPORINE (RESTASIS) 0.05 % ophthalmic emulsion Place 1 drop into both eyes 2 (two) times daily.   FLUTICASONE PROPIONATE, NASAL, NA    MAGNESIUM PO Take by mouth.   methocarbamol (ROBAXIN) 500 MG tablet Take 1 tablet (500 mg total) by mouth every 8 (eight) hours as needed for muscle spasms. (Patient not taking: Reported on 03/30/2023)   polyethylene glycol (MIRALAX / GLYCOLAX) 17 g packet Take 17 g by mouth daily.   SOOLANTRA 1 % CREA APPLY TO THE AFFECTED AREA(S) ONCE DAILY AS DIRECTED   traZODone (DESYREL) 50 MG tablet Take 1/2 tablet by mouth at bedtime as needed for restlessness   valACYclovir (VALTREX) 500 MG tablet Take 1 tablet (500 mg total) by mouth daily. Prophylaxis   VITAMIN D PO Take 1 tablet by mouth daily.   amoxicillin-clavulanate (AUGMENTIN) 875-125 MG  tablet Take 1 tablet by mouth 2 (two) times daily for 10 days   amoxicillin-clavulanate (AUGMENTIN) 875-125 MG tablet Take 1 tablet by mouth every 12 (twelve) hours for 10 days (Patient not taking: Reported on 03/30/2023)   diazepam (DIASTAT ACUDIAL) 10 MG GEL Place rectally once. (Patient not taking: Reported on 03/30/2023)   montelukast (SINGULAIR) 10 MG tablet Take 1 tablet (10 mg total) by mouth daily. (Patient not taking: Reported on 03/30/2023)    pantoprazole (PROTONIX) 40 MG tablet Take 1 tablet (40 mg) by mouth daily approximately 30 minutes before breakfast (Patient not taking: Reported on 03/30/2023)   predniSONE (DELTASONE) 20 MG tablet Take 2 tablets by mouth once daily for 5 days (Patient not taking: Reported on 03/30/2023)   No facility-administered encounter medications on file as of 03/30/2023.    PAST MEDICAL HISTORY: Past Medical History:  Diagnosis Date   Constipation    GERD (gastroesophageal reflux disease)    no meds - diet controlled   Herpes    History of kidney stones    passed stones - no surgery required   HPV in female    Hyperlipidemia    diet controlled - no meds   Missed ab    no surgery required   Seasonal allergies    SVD (spontaneous vaginal delivery)    x 2    PAST SURGICAL HISTORY: Past Surgical History:  Procedure Laterality Date   CATARACT EXTRACTION Bilateral    COLONOSCOPY     x 2 -hx polyps   COLPOSCOPY     DILATATION & CURETTAGE/HYSTEROSCOPY WITH MYOSURE N/A 11/04/2016   Procedure: DILATATION & CURETTAGE/HYSTEROSCOPY WITH MYOSURE;  Surgeon: Genia Del, MD;  Location: [facility];  Service: Gynecology;  Laterality: N/A;   RETINAL DETACHMENT SURGERY Left 2000   ROBOTIC ASSISTED LAPAROSCOPIC OVARIAN CYSTECTOMY Left 11/04/2016   Procedure: ROBOTIC ASSISTED LAPAROSCOPIC OVARIAN CYSTECTOMY;  Surgeon: Genia Del, MD;  Location: [facility];  Service: Gynecology;  Laterality: Left;  RNFA confirmed on 10/26/16 with chass   ROBOTIC ASSISTED SALPINGO OOPHERECTOMY Right 11/04/2016   Procedure: ROBOTIC ASSISTED SALPINGO OOPHORECTOMY;  Surgeon: Genia Del, MD;  Location: [facility];  Service: Gynecology;  Laterality: Right;   WISDOM TOOTH EXTRACTION      ALLERGIES: Allergies  Allergen Reactions   Ambien [Zolpidem Tartrate] Other (See Comments)    hallucinations   Flagyl [Metronidazole]     neuropathy   Septra [Sulfamethoxazole-Trimethoprim] Other (See Comments)    thrush     FAMILY HISTORY: Family History  Problem Relation Age of Onset   Diabetes Father    Breast cancer Paternal Aunt    Cancer Paternal Uncle        pancreatic   Diabetes Paternal Grandmother    Cancer Paternal Grandmother        stomach    Cancer Paternal Grandfather        colon    SOCIAL HISTORY: Social History   Tobacco Use   Smoking status: Never   Smokeless tobacco: Current  Vaping Use   Vaping status: Never Used  Substance Use Topics   Alcohol use: Yes    Alcohol/week: 1.0 standard drink of alcohol    Types: 1 Glasses of wine per week    Comment: i glass a week of wine   Drug use: No   Social History   Social History Narrative   Are you right handed or left handed? Right   Are you currently employed ?    What is your current occupation? [employer]   Do you live at home alone?   Who lives with you? family   What type of home do you live in: 1 story or 2 story? two   Caffeine 15 oz of soda      OBJECTIVE: PHYSICAL EXAM: BP 123/74   Pulse 98   Ht 5' 2.5" (1.588 m)   Wt 145 lb (65.8 kg)   LMP 09/02/2018   SpO2 99%   BMI 26.10 kg/m   General: General appearance: Awake and alert. No distress. Cooperative with exam.  Skin: No obvious rash or jaundice. HEENT: Atraumatic. Anicteric. Lungs: Non-labored breathing on room air  Extremities: No edema. No obvious deformity.  Musculoskeletal: No obvious joint swelling. Psych: Affect appropriate.  Neurological: Mental Status: Alert. Speech fluent. No pseudobulbar affect Cranial Nerves: CNII: No RAPD. Visual fields grossly intact. CNIII, IV, VI: PERRL. No nystagmus. EOMI. CN V: Facial sensation intact bilaterally to fine touch.  CN VII: Facial muscles symmetric and strong. No ptosis at rest. CN VIII: Hearing grossly intact bilaterally. CN IX: No hypophonia. CN X: Palate elevates symmetrically. CN XI: Full strength shoulder shrug bilaterally. CN XII: Tongue protrusion full and midline. No atrophy or  fasciculations. No significant dysarthria Motor: Tone is normal. Strength is 5/5 in bilateral upper and lower extremities.  Reflexes:  Right Left   Bicep 2+ 2+   Tricep 2+ 2+   BrRad 2+ 2+   Knee 2+ 2+   Ankle 2+ 2+    Pathological Reflexes: Hoffman: Absent bilaterally Troemner: Absent bilaterally Sensation: Pinprick: Intact in all extremities Coordination: Intact finger-to- nose-finger bilaterally. Gait: Able to rise from chair with arms crossed unassisted. Normal, narrow-based gait.  Lab and Test Review: Imaging: MRI cervical and thoracic spine wo contrast (01/25/23): FINDINGS: MRI CERVICAL SPINE FINDINGS   Alignment: No substantial sided subluxation.   Vertebrae: No specific evidence of acute fracture or discitis/osteomyelitis. T1 hypointense T8 bone lesion without significant STIR hyperintensity.   Cord: Normal cord signal.   Posterior Fossa, vertebral arteries, paraspinal tissues: Visualized vertebral artery flow voids are maintained. No paraspinal edema. No acute abnormality in the visualized posterior fossa.   Disc levels:   C2-C3: No significant disc protrusion, foraminal stenosis, or canal stenosis.   C3-C4: No significant disc protrusion, foraminal stenosis, or canal stenosis.   C4-C5: Small posterior disc osteophyte complex without significant stenosis.   C5-C6: No significant disc protrusion, foraminal stenosis, or canal stenosis.   C6-C7: No significant disc protrusion, foraminal stenosis, or canal stenosis.   C7-T1: No significant disc protrusion, foraminal stenosis, or canal stenosis.   MRI THORACIC SPINE FINDINGS   Alignment:  Physiologic.   Vertebrae: No fracture, evidence of discitis, or bone lesion.   Cord: Mildly prominent central canal measuring up to 1-2 mm. Otherwise, normal cord signal.   Paraspinal and other soft tissues: Unremarkable.   Disc levels:   No significant canal or foraminal stenosis.   IMPRESSION: 1. No  significant canal or foraminal stenosis in the cervical or thoracic spine. No evidence of acute bony abnormality. 2. Mildly prominent central canal in the thoracic cord measuring up to 1-2 mm and borderline for small syrinx versus anatomic variant. 3. Focal T1 hypointense T8 bone lesion without significant STIR hyperintensity. This most likely represents an atypical benign vertebral venous malformation, but given signal characteristics a follow-up MRI of the thoracic spine with contrast in 4-6 months is recommended to ensure stability and exclude malignancy.  MRI lumbar spine w/wo contrast (12/25/21): FINDINGS: Segmentation: Standard. Lowest well-formed  disc space labeled the L5-S1 level.   Alignment: Trace scoliosis. Alignment otherwise normal with preservation of the normal lumbar lordosis. No listhesis.   Vertebrae: Vertebral body height well maintained without acute or chronic fracture. Bone marrow signal intensity within normal limits. No worrisome osseous lesions. No abnormal marrow edema or enhancement.   Conus medullaris and cauda equina: Conus extends to the L1-2 level. Trace prominence of the central canal without frank syrinx noted at the distal cord (series 101, image 1). Conus medullaris otherwise unremarkable and normal in appearance. Nerve roots of the cauda equina within normal limits. No abnormal enhancement.   Paraspinal and other soft tissues: Paraspinous soft tissues within normal limits. Visualized visceral structures are unremarkable.   Disc levels:   L1-2:  Unremarkable.   L2-3:  Unremarkable.   L3-4: Mild circumferential disc bulge. No significant spinal stenosis. Foramina remain patent. No neural impingement.   L4-5: Mild disc desiccation with circumferential disc bulge. Minimal reactive endplate spurring. Mild bilateral facet hypertrophy with associated trace joint effusions. No spinal stenosis. Mild left L4 foraminal narrowing. Right neural  foramen remains patent. No frank impingement.   L5-S1: Normal interspace. Mild facet spurring. No spinal stenosis. Foramina remain patent. No impingement.   IMPRESSION: 1. Mild degenerative disc bulge and facet hypertrophy at L4-5 with resultant mild left L4 foraminal stenosis, but no overt neural impingement. 2. Minimal noncompressive disc bulging at L3-4 without stenosis or neural impingement. 3. Otherwise essentially normal MRI of the lumbar spine. No other findings to explain patient's symptoms identified.  MRI brain w/wo contrast (07/30/2001): IMPRESSION  NORMAL MRI OF THE BRAIN.   ASSESSMENT: ADWITA FUNKE is a 52 y.o. female who presents for evaluation of left mid back discomfort/pain. Her neurological examination is essentially normal today. She felt symptoms during examination of her arms, but I was not able to recreate symptoms while examining back (patient in a gown). Based on her estimation with her hand, the symptoms are paraspinal in ~ T7 or T8 level. Available diagnostic data is significant for MRI thoracic spine showing likely syrinx from ~T9 to T12 or L1. She also has T2 hyperintensity in T7 vertebrae and T2 hypointensity in T8 vertebrae. The T8 lesion is favored to be a vertebral venous malformation by radiology, but repeat scan in 4-6 months was recommended to assess for stability. It is interesting that her symptoms are very close to abnormalities seen on MRI, but I am unsure if either are related, particularly the syrinx. I would expect symptoms in legs such as hyper-reflexia, numbness, or weakness, which are not seen. Obviously, a message would not cause either of these changes either. The etiology of her symptoms is currently unclear. It could be muscle pain or local nerve injury among other possibilities. I explained that there does not appear to be a nerve root issue on MRI (thoracic radiculopathy). I also explained that electrodiagnostics such as EMG is not helpful in  isolated thoracic nerve concerns as these nerves cannot be stimulated to assess response.  PLAN: -Agree with repeating MRI thoracic spine as recommended 05/2023-07/2023 -Would monitor symptoms and favor conservative management for now. Discussed neuropathic pain meds, but patient was not interested currently.  -Return to clinic as needed  The impression above as well as the plan as outlined below were extensively discussed with the patient who voiced understanding. All questions were answered to their satisfaction.  When available, results of the above investigations and possible further recommendations will be communicated to the patient via telephone/MyChart. Patient  to call office if not contacted after expected testing turnaround time.   Total time spent reviewing records, interview, history/exam, documentation, and coordination of care on day of encounter:  70 min   Thank you for allowing me to participate in patient's care.  If I can answer any additional questions, I would be pleased to do so.  Jacquelyne Balint, MD   CC: Shon Hale, MD [address] [place] [address]  CC: Referring provider: Cammy Copa, MD [address] [place],  [place] [address]

## 2023-03-23 ENCOUNTER — Other Ambulatory Visit (HOSPITAL_COMMUNITY): Payer: Self-pay

## 2023-03-24 ENCOUNTER — Other Ambulatory Visit (HOSPITAL_COMMUNITY): Payer: Self-pay

## 2023-03-29 ENCOUNTER — Ambulatory Visit: Payer: 59 | Admitting: Neurology

## 2023-03-29 DIAGNOSIS — H31003 Unspecified chorioretinal scars, bilateral: Secondary | ICD-10-CM | POA: Diagnosis not present

## 2023-03-29 DIAGNOSIS — Z961 Presence of intraocular lens: Secondary | ICD-10-CM | POA: Diagnosis not present

## 2023-03-29 DIAGNOSIS — H40013 Open angle with borderline findings, low risk, bilateral: Secondary | ICD-10-CM | POA: Diagnosis not present

## 2023-03-30 ENCOUNTER — Ambulatory Visit (INDEPENDENT_AMBULATORY_CARE_PROVIDER_SITE_OTHER): Payer: 59 | Admitting: Neurology

## 2023-03-30 ENCOUNTER — Encounter: Payer: Self-pay | Admitting: Neurology

## 2023-03-30 VITALS — BP 123/74 | HR 98 | Ht 62.5 in | Wt 145.0 lb

## 2023-03-30 DIAGNOSIS — G950 Syringomyelia and syringobulbia: Secondary | ICD-10-CM | POA: Diagnosis not present

## 2023-03-30 DIAGNOSIS — M546 Pain in thoracic spine: Secondary | ICD-10-CM | POA: Diagnosis not present

## 2023-03-30 DIAGNOSIS — Q279 Congenital malformation of peripheral vascular system, unspecified: Secondary | ICD-10-CM | POA: Diagnosis not present

## 2023-03-30 NOTE — Patient Instructions (Signed)
I saw you today for the thoracic back discomfort. There are changes on your MRI thoracic spine in the bone and in the spine cord in the same level or area of your pain, but I am not sure this explains your symptoms. The syrinx mentioned in your spinal cord does not appear to be causing symptoms (would expect changes in your legs). There does not appear to be a pinched nerve root in the area to explain symptoms.  I recommend repeating the MRI as planned early next year and allow time to see how symptoms heal.  I am happy to re-evaluate you if needed. Please let me know if you have any questions or concerns.  The physicians and staff at [facility] are committed to providing excellent care. You may receive a survey requesting feedback about your experience at our office. We strive to receive "very good" responses to the survey questions. If you feel that your experience would prevent you from giving the office a "very good " response, please contact our office to try to remedy the situation. We may be reached at [phone]. Thank you for taking the time out of your busy day to complete the survey.  Jacquelyne Balint, MD [facility]

## 2023-04-11 DIAGNOSIS — F411 Generalized anxiety disorder: Secondary | ICD-10-CM | POA: Diagnosis not present

## 2023-04-17 ENCOUNTER — Other Ambulatory Visit (HOSPITAL_COMMUNITY): Payer: Self-pay

## 2023-04-18 ENCOUNTER — Other Ambulatory Visit (HOSPITAL_COMMUNITY): Payer: Self-pay

## 2023-04-18 DIAGNOSIS — L578 Other skin changes due to chronic exposure to nonionizing radiation: Secondary | ICD-10-CM | POA: Diagnosis not present

## 2023-04-18 DIAGNOSIS — D225 Melanocytic nevi of trunk: Secondary | ICD-10-CM | POA: Diagnosis not present

## 2023-04-18 DIAGNOSIS — R202 Paresthesia of skin: Secondary | ICD-10-CM | POA: Diagnosis not present

## 2023-04-18 DIAGNOSIS — L719 Rosacea, unspecified: Secondary | ICD-10-CM | POA: Diagnosis not present

## 2023-04-18 DIAGNOSIS — L814 Other melanin hyperpigmentation: Secondary | ICD-10-CM | POA: Diagnosis not present

## 2023-04-18 DIAGNOSIS — L821 Other seborrheic keratosis: Secondary | ICD-10-CM | POA: Diagnosis not present

## 2023-04-18 DIAGNOSIS — Z86018 Personal history of other benign neoplasm: Secondary | ICD-10-CM | POA: Diagnosis not present

## 2023-04-18 MED ORDER — IVERMECTIN 1 % EX CREA
TOPICAL_CREAM | CUTANEOUS | 3 refills | Status: AC
  Filled 2023-04-18 – 2023-04-24 (×3): qty 45, 30d supply, fill #0
  Filled 2024-03-25: qty 45, 30d supply, fill #1

## 2023-04-19 ENCOUNTER — Other Ambulatory Visit (HOSPITAL_COMMUNITY): Payer: Self-pay

## 2023-04-21 ENCOUNTER — Other Ambulatory Visit (HOSPITAL_COMMUNITY): Payer: Self-pay

## 2023-04-21 ENCOUNTER — Other Ambulatory Visit: Payer: Self-pay

## 2023-04-24 ENCOUNTER — Other Ambulatory Visit (HOSPITAL_COMMUNITY): Payer: Self-pay

## 2023-04-24 DIAGNOSIS — F411 Generalized anxiety disorder: Secondary | ICD-10-CM | POA: Diagnosis not present

## 2023-04-25 ENCOUNTER — Other Ambulatory Visit (HOSPITAL_COMMUNITY): Payer: Self-pay

## 2023-04-25 ENCOUNTER — Encounter: Payer: Self-pay | Admitting: Neurology

## 2023-04-26 ENCOUNTER — Other Ambulatory Visit (HOSPITAL_COMMUNITY): Payer: Self-pay

## 2023-05-04 DIAGNOSIS — F411 Generalized anxiety disorder: Secondary | ICD-10-CM | POA: Diagnosis not present

## 2023-05-08 ENCOUNTER — Inpatient Hospital Stay
Admission: RE | Admit: 2023-05-08 | Discharge: 2023-05-08 | Discharge status: Home / Self Care | Payer: 59 | Source: Ambulatory Visit | Attending: Family Medicine

## 2023-05-08 DIAGNOSIS — Z1231 Encounter for screening mammogram for malignant neoplasm of breast: Secondary | ICD-10-CM

## 2023-05-12 DIAGNOSIS — F411 Generalized anxiety disorder: Secondary | ICD-10-CM | POA: Diagnosis not present

## 2023-05-16 ENCOUNTER — Other Ambulatory Visit (HOSPITAL_COMMUNITY): Payer: Self-pay

## 2023-05-16 MED ORDER — CYCLOSPORINE 0.05 % OP EMUL
1.0000 [drp] | Freq: Two times a day (BID) | OPHTHALMIC | 3 refills | Status: DC
  Filled 2023-05-16: qty 180, 90d supply, fill #0
  Filled 2023-09-01 (×2): qty 180, 90d supply, fill #1
  Filled 2023-11-27: qty 180, 90d supply, fill #2
  Filled 2024-02-21: qty 180, 90d supply, fill #3

## 2023-06-07 ENCOUNTER — Other Ambulatory Visit (HOSPITAL_COMMUNITY): Payer: Self-pay

## 2023-06-07 DIAGNOSIS — R09A2 Foreign body sensation, throat: Secondary | ICD-10-CM | POA: Diagnosis not present

## 2023-06-07 DIAGNOSIS — K219 Gastro-esophageal reflux disease without esophagitis: Secondary | ICD-10-CM | POA: Diagnosis not present

## 2023-06-07 MED ORDER — OMEPRAZOLE 40 MG PO CPDR
40.0000 mg | DELAYED_RELEASE_CAPSULE | Freq: Two times a day (BID) | ORAL | 3 refills | Status: DC
  Filled 2023-06-07: qty 60, 30d supply, fill #0
  Filled 2023-07-06: qty 60, 30d supply, fill #1
  Filled 2023-08-04: qty 60, 30d supply, fill #2

## 2023-06-09 DIAGNOSIS — F411 Generalized anxiety disorder: Secondary | ICD-10-CM | POA: Diagnosis not present

## 2023-07-05 DIAGNOSIS — K2100 Gastro-esophageal reflux disease with esophagitis, without bleeding: Secondary | ICD-10-CM | POA: Diagnosis not present

## 2023-07-05 DIAGNOSIS — Z860100 Personal history of colon polyps, unspecified: Secondary | ICD-10-CM | POA: Diagnosis not present

## 2023-07-10 ENCOUNTER — Telehealth: Payer: Self-pay | Admitting: Orthopedic Surgery

## 2023-07-10 DIAGNOSIS — F411 Generalized anxiety disorder: Secondary | ICD-10-CM | POA: Diagnosis not present

## 2023-07-10 NOTE — Telephone Encounter (Signed)
 Received call from patient inquiring about getting MRI scans. I advised pt that she would need to get images from the facility where was done. If they just need reports, I advised pt to have them fax [place] a request as we can release doctor to doctor.

## 2023-07-25 DIAGNOSIS — F411 Generalized anxiety disorder: Secondary | ICD-10-CM | POA: Diagnosis not present

## 2023-08-08 ENCOUNTER — Other Ambulatory Visit (HOSPITAL_COMMUNITY): Payer: Self-pay

## 2023-08-08 ENCOUNTER — Telehealth: Payer: Self-pay | Admitting: Radiology

## 2023-08-08 ENCOUNTER — Other Ambulatory Visit: Payer: Self-pay | Admitting: Orthopedic Surgery

## 2023-08-08 ENCOUNTER — Ambulatory Visit
Admission: RE | Admit: 2023-08-08 | Discharge: 2023-08-08 | Disposition: A | Discharge status: Home / Self Care | Payer: 59 | Source: Ambulatory Visit | Attending: Orthopedic Surgery | Admitting: Orthopedic Surgery

## 2023-08-08 DIAGNOSIS — M546 Pain in thoracic spine: Secondary | ICD-10-CM

## 2023-08-08 DIAGNOSIS — M545 Low back pain, unspecified: Secondary | ICD-10-CM | POA: Diagnosis not present

## 2023-08-08 DIAGNOSIS — M899 Disorder of bone, unspecified: Secondary | ICD-10-CM | POA: Diagnosis not present

## 2023-08-08 MED ORDER — GADOPICLENOL 0.5 MMOL/ML IV SOLN: 6.5000 mL | Freq: Once | INTRAVENOUS | Status: DC | PRN

## 2023-08-08 MED ORDER — OMEPRAZOLE 20 MG PO CPDR
20.0000 mg | DELAYED_RELEASE_CAPSULE | Freq: Every morning | ORAL | 1 refills | Status: DC
  Filled 2023-08-08: qty 90, 90d supply, fill #0
  Filled 2023-11-01: qty 90, 90d supply, fill #1

## 2023-08-08 NOTE — Telephone Encounter (Signed)
 FYI   Received call from [facility] - Had patient on table stated for MRI w/o stated that study had indications of needing with contrast as well, no auth was required confirmed by facility.  Gave verbal to do with/without study while patient was at facility and on table.

## 2023-08-08 NOTE — Telephone Encounter (Signed)
 Noted.

## 2023-08-08 NOTE — Telephone Encounter (Signed)
 Ok thx with contrast is correct

## 2023-08-17 ENCOUNTER — Ambulatory Visit: Payer: 59 | Admitting: Orthopedic Surgery

## 2023-08-17 DIAGNOSIS — M546 Pain in thoracic spine: Secondary | ICD-10-CM | POA: Diagnosis not present

## 2023-08-18 ENCOUNTER — Other Ambulatory Visit: Payer: Self-pay

## 2023-08-18 ENCOUNTER — Encounter: Payer: Self-pay | Admitting: Orthopedic Surgery

## 2023-08-18 NOTE — Progress Notes (Addendum)
 Office Visit Note   Patient: Ashley Marshall           Date of Birth: 27-Mar-1971           MRN: [id number] Visit Date: 08/17/2023 Requested by: Ransom Byers, MD [address] [place],  [place] [address] PCP: Ransom Byers, MD  Subjective: Chief Complaint  Patient presents with   Other     Scan review    HPI: Ashley Marshall is a 53 y.o. female who presents to the office reporting continued left-sided focal thoracic spine pain and scapular pain.  Started with deep tissue massage from a massage therapist on 11/22/2022.Aaron Aas  No real changes.  She has had a second MRI scan to evaluate hemangioma at T8.  That scan is reviewed with the patient today and there have been no changes compared to her prior scan.  Overall from a clinical perspective this is something she can live with but it is not ideal.  She does feel the pressure of this problem in that region of her scapula and thoracic spine..                ROS: All systems reviewed are negative as they relate to the chief complaint within the history of present illness.  Patient denies fevers or chills.  Assessment & Plan: Visit Diagnoses:  1. Pain in thoracic spine     Plan: Impression is no overt structural problem in the thoracic spine to account for her symptoms.  This is likely a focal musculoskeletal strain pattern versus unknown pain generators in that region of her body.  Per patient request we will send her to a physiatrist for further in-depth management of this soft tissue problem.  Follow-up with us  as needed.  Follow-Up Instructions: No follow-ups on file.   Orders:  No orders of the defined types were placed in this encounter.  No orders of the defined types were placed in this encounter.     Procedures: No procedures performed   Clinical Data: No additional findings.  Objective: Vital Signs: LMP 09/02/2018   Physical Exam:  Constitutional: Patient appears well-developed HEENT:  Head:  Normocephalic Eyes:EOM are normal Neck: Normal range of motion Cardiovascular: Normal rate Pulmonary/chest: Effort normal Neurologic: Patient is alert Skin: Skin is warm Psychiatric: Patient has normal mood and affect  Ortho Exam: Ortho exam demonstrates full active and passive range of motion of the shoulder with no scapular dyskinesia with forward flexion or abduction.  No discrete skin changes in that thoracic spine area.  Specialty Comments:  No specialty comments available.  Imaging: No results found.   PMFS History: There are no active problems to display for this patient.  Past Medical History:  Diagnosis Date   Constipation    GERD (gastroesophageal reflux disease)    no meds - diet controlled   Herpes    History of kidney stones    passed stones - no surgery required   HPV in female    Hyperlipidemia    diet controlled - no meds   Missed ab    no surgery required   Seasonal allergies    SVD (spontaneous vaginal delivery)    x 2    Family History  Problem Relation Age of Onset   Diabetes Father    Breast cancer Paternal Aunt    Cancer Paternal Uncle        pancreatic   Diabetes Paternal Grandmother    Cancer Paternal Grandmother  stomach    Cancer Paternal Grandfather        colon    Past Surgical History:  Procedure Laterality Date   CATARACT EXTRACTION Bilateral    COLONOSCOPY     x 2 -hx polyps   COLPOSCOPY     DILATATION & CURETTAGE/HYSTEROSCOPY WITH MYOSURE N/A 11/04/2016   Procedure: DILATATION & CURETTAGE/HYSTEROSCOPY WITH MYOSURE;  Surgeon: Lavoie, Marie-Lyne, MD;  Location: [facility];  Service: Gynecology;  Laterality: N/A;   RETINAL DETACHMENT SURGERY Left 2000   ROBOTIC ASSISTED LAPAROSCOPIC OVARIAN CYSTECTOMY Left 11/04/2016   Procedure: ROBOTIC ASSISTED LAPAROSCOPIC OVARIAN CYSTECTOMY;  Surgeon: Lavoie, Marie-Lyne, MD;  Location: [facility];  Service: Gynecology;  Laterality: Left;  RNFA confirmed on 10/26/16 with chass   ROBOTIC ASSISTED  SALPINGO OOPHERECTOMY Right 11/04/2016   Procedure: ROBOTIC ASSISTED SALPINGO OOPHORECTOMY;  Surgeon: Percy Bracken, MD;  Location: [facility];  Service: Gynecology;  Laterality: Right;   WISDOM TOOTH EXTRACTION     Social History   Occupational History   Not on file  Tobacco Use   Smoking status: Never   Smokeless tobacco: Current  Vaping Use   Vaping status: Never Used  Substance and Sexual Activity   Alcohol use: Yes    Alcohol/week: 1.0 standard drink of alcohol    Types: 1 Glasses of wine per week    Comment: i glass a week of wine   Drug use: No   Sexual activity: Not Currently    Partners: Male    Birth control/protection: Post-menopausal    Comment: 1st intercourse- 22, partners- 2

## 2023-08-21 ENCOUNTER — Encounter: Payer: Self-pay | Admitting: Orthopedic Surgery

## 2023-08-21 ENCOUNTER — Telehealth: Payer: Self-pay

## 2023-08-21 ENCOUNTER — Encounter: Payer: Self-pay | Admitting: Genetic Counselor

## 2023-08-21 DIAGNOSIS — M542 Cervicalgia: Secondary | ICD-10-CM

## 2023-08-21 DIAGNOSIS — M546 Pain in thoracic spine: Secondary | ICD-10-CM

## 2023-08-21 NOTE — Addendum Note (Signed)
 Addended by: Barbette Or on: 08/21/2023 02:23 PM   Modules accepted: Orders

## 2023-08-21 NOTE — Telephone Encounter (Signed)
 Referral placed in chart

## 2023-08-21 NOTE — Telephone Encounter (Signed)
-----   Message from Burnard Bunting sent at 08/18/2023  6:30 AM EDT ----- Please refer to Dr.krutika raulkar who is a physiatrist in the [facility] system for further evaluation and management of this problem.  Thanks

## 2023-08-22 ENCOUNTER — Inpatient Hospital Stay: Attending: Genetic Counselor | Admitting: Genetic Counselor

## 2023-08-22 ENCOUNTER — Encounter: Payer: Self-pay | Admitting: Neurology

## 2023-08-22 ENCOUNTER — Encounter: Payer: Self-pay | Admitting: Genetic Counselor

## 2023-08-22 ENCOUNTER — Other Ambulatory Visit: Payer: Self-pay | Admitting: Genetic Counselor

## 2023-08-22 DIAGNOSIS — Z803 Family history of malignant neoplasm of breast: Secondary | ICD-10-CM | POA: Insufficient documentation

## 2023-08-22 DIAGNOSIS — Z800 Family history of malignant neoplasm of digestive organs: Secondary | ICD-10-CM | POA: Diagnosis not present

## 2023-08-22 NOTE — Progress Notes (Unsigned)
 REFERRING PROVIDER: Shon Hale, MD [address] [place],  [place] [address]  PRIMARY PROVIDER:  Shon Hale, MD  PRIMARY REASON FOR VISIT:  1. Family history of pancreatic cancer   2. Family history of colon cancer   3. Family history of breast cancer      HISTORY OF PRESENT ILLNESS:   Ashley Marshall, a 53 y.o. female, was seen for a [place] cancer genetics consultation at the request of Dr. Chanetta Marshall due to a {Personal/family:20331} history of {cancer/polyps}.  Ashley Marshall presents to clinic today to discuss the possibility of a hereditary predisposition to cancer, genetic testing, and to further clarify her future cancer risks, as well as potential cancer risks for family members.   Ashley Marshall is a 53 y.o. female with no personal history of cancer.    CANCER HISTORY:  Oncology History   No history exists.     RISK FACTORS:  Menarche was at age 40.  First live birth at age 40.  OCP use for approximately 0 years.  Ovaries intact: one.  Hysterectomy: no.  Menopausal status: postmenopausal.  HRT use: 0 years. Colonoscopy: yes; normal. Mammogram within the last year: yes. Number of breast biopsies: 0. Up to date with pelvic exams: yes. Any excessive radiation exposure in the past: no  Past Medical History:  Diagnosis Date   Constipation    Family history of breast cancer    Family history of colon cancer    Family history of pancreatic cancer    GERD (gastroesophageal reflux disease)    no meds - diet controlled   Herpes    History of kidney stones    passed stones - no surgery required   HPV in female    Hyperlipidemia    diet controlled - no meds   Missed ab    no surgery required   Seasonal allergies    SVD (spontaneous vaginal delivery)    x 2    Past Surgical History:  Procedure Laterality Date   CATARACT EXTRACTION Bilateral    COLONOSCOPY     x 2 -hx polyps   COLPOSCOPY     DILATATION & CURETTAGE/HYSTEROSCOPY WITH  MYOSURE N/A 11/04/2016   Procedure: DILATATION & CURETTAGE/HYSTEROSCOPY WITH MYOSURE;  Surgeon: Genia Del, MD;  Location: [facility];  Service: Gynecology;  Laterality: N/A;   RETINAL DETACHMENT SURGERY Left 2000   ROBOTIC ASSISTED LAPAROSCOPIC OVARIAN CYSTECTOMY Left 11/04/2016   Procedure: ROBOTIC ASSISTED LAPAROSCOPIC OVARIAN CYSTECTOMY;  Surgeon: Genia Del, MD;  Location: [facility];  Service: Gynecology;  Laterality: Left;  RNFA confirmed on 10/26/16 with chass   ROBOTIC ASSISTED SALPINGO OOPHERECTOMY Right 11/04/2016   Procedure: ROBOTIC ASSISTED SALPINGO OOPHORECTOMY;  Surgeon: Genia Del, MD;  Location: [facility];  Service: Gynecology;  Laterality: Right;   WISDOM TOOTH EXTRACTION      Social History   Socioeconomic History   Marital status: Married    Spouse name: Not on file   Number of children: Not on file   Years of education: Not on file   Highest education level: Not on file  Occupational History   Not on file  Tobacco Use   Smoking status: Never   Smokeless tobacco: Current  Vaping Use   Vaping status: Never Used  Substance and Sexual Activity   Alcohol use: Yes    Alcohol/week: 1.0 standard drink of alcohol    Types: 1 Glasses of wine per week    Comment: i glass a week of wine  Drug use: No   Sexual activity: Not Currently    Partners: Male    Birth control/protection: Post-menopausal    Comment: 1st intercourse- 22, partners- 2  Other Topics Concern   Not on file  Social History Narrative   Are you right handed or left handed? Right   Are you currently employed ?    What is your current occupation? [employer]   Do you live at home alone?   Who lives with you? family   What type of home do you live in: 1 story or 2 story? two   Caffeine 15 oz of soda    Social Drivers of Corporate investment banker Strain: Not on file  Food Insecurity: Not on file  Transportation Needs: Not on file  Physical Activity: Not on file  Stress: Not on file   Social Connections: Not on file     FAMILY HISTORY:  We obtained a detailed, 4-generation family history.  Significant diagnoses are listed below: Family History  Problem Relation Age of Onset   Diabetes Father    Heart attack Maternal Uncle    Breast cancer Paternal Aunt 72   Colon cancer Paternal Aunt 69   Pancreatic cancer Paternal Uncle        pancreatic   Heart attack Maternal Grandmother    Heart attack Maternal Grandfather    Diabetes Paternal Grandmother    Colon cancer Paternal Grandmother 54   Colon cancer Paternal Grandfather 64      Ashley Marshall is {aware/unaware} of previous family history of genetic testing for hereditary cancer risks. Patient's maternal ancestors are of *** descent, and paternal ancestors are of *** descent. There {IS NO:12509} reported Ashkenazi Jewish ancestry. There {IS NO:12509} known consanguinity.  GENETIC COUNSELING ASSESSMENT: Ashley Marshall is a 53 y.o. female with a {Personal/family:20331} history of {cancer/polyps} which is somewhat suggestive of a {DISEASE} and predisposition to cancer given ***. We, therefore, discussed and recommended the following at today's visit.   DISCUSSION: We discussed that, in general, most cancer is not inherited in families, but instead is sporadic or familial. Sporadic cancers occur by chance and typically happen at older ages (>50 years) as this type of cancer is caused by genetic changes acquired during an individual's lifetime. Some families have more cancers than would be expected by chance; however, the ages or types of cancer are not consistent with a known genetic mutation or known genetic mutations have been ruled out. This type of familial cancer is thought to be due to a combination of multiple genetic, environmental, hormonal, and lifestyle factors. While this combination of factors likely increases the risk of cancer, the exact source of this risk is not currently identifiable or testable.  We discussed  that *** - ***% of *** is hereditary, with most cases associated with ***.  There are other genes that can be associated with hereditary *** cancer syndromes.  These include ***.  We discussed that testing is beneficial for several reasons including knowing how to follow individuals after completing their treatment, identifying whether potential treatment options such as PARP inhibitors would be beneficial, and understand if other family members could be at risk for cancer and allow them to undergo genetic testing.   We reviewed the characteristics, features and inheritance patterns of hereditary cancer syndromes. We also discussed genetic testing, including the appropriate family members to test, the process of testing, insurance coverage and turn-around-time for results. We discussed the implications of a negative, positive, carrier and/or  variant of uncertain significant result. Ashley Marshall  was offered a common hereditary cancer panel (36+ genes) and an expanded pan-cancer panel (70+ genes). Ashley Marshall was informed of the benefits and limitations of each panel, including that expanded pan-cancer panels contain genes that do not have clear management guidelines at this point in time.  We also discussed that as the number of genes included on a panel increases, the chances of variants of uncertain significance increases. Ashley Marshall decided to pursue genetic testing for the *** gene panel.   Based on Ashley Marshall's {Personal/family:20331} history of cancer, she meets medical criteria for genetic testing. ***Though Ashley Marshall is not personally affected, there are no affected family members that are willing/able/available to undergo hereditary cancer testing.  Therefore, Ashley Marshall the most informative family member available.  Despite that she meets criteria, she may still have an out of pocket cost. We discussed that if her out of pocket cost for testing is over $100, the laboratory will call and confirm  whether she wants to proceed with testing.  If the out of pocket cost of testing is less than $100 she will be billed by the genetic testing laboratory.   We discussed that some people do not want to undergo genetic testing due to fear of genetic discrimination.  The Genetic Information Nondiscrimination Act (GINA) was signed into federal law in 2008. GINA prohibits health insurers and most employers from discriminating against individuals based on genetic information (including the results of genetic tests and family history information). According to GINA, health insurance companies cannot consider genetic information to be a preexisting condition, nor can they use it to make decisions regarding coverage or rates. GINA also makes it illegal for most employers to use genetic information in making decisions about hiring, firing, promotion, or terms of employment. It is important to note that GINA does not offer protections for life insurance, disability insurance, or long-term care insurance. GINA does not apply to those in the [employer], those who work for companies with less than 15 employees, and new life insurance or long-term disability insurance policies.  Health status due to a cancer diagnosis is not protected under GINA. More information about GINA can be found by visiting [email or web address].  PLAN: After considering the risks, benefits, and limitations, Ashley Marshall provided informed consent to pursue genetic testing and the blood sample was sent to {Lab} Laboratories for analysis of the {test}. Results should be available within approximately {TAT TIME} weeks' time, at which point they will be disclosed by telephone to Ashley Marshall, as will any additional recommendations warranted by these results. Ashley Marshall will receive a summary of her genetic counseling visit and a copy of her results once available. This information will also be available in Epic.   Lastly, we encouraged Ashley Marshall to remain in  contact with cancer genetics annually so that we can continuously update the family history and inform her of any changes in cancer genetics and testing that may be of benefit for this family.   Ashley Marshall questions were answered to her satisfaction today. Our contact information was provided should additional questions or concerns arise. Thank you for the referral and allowing [place] to share in the care of your patient.   Kelcy Laible P. Lowell Guitar, MS, CGC Licensed, Patent attorney [email or web address] phone: [phone]  *** minutes were spent on the date of the encounter in service to the patient including preparation, face-to-face consultation, documentation and care coordination.  *** The patient was seen alone.  ***  The patient brought ***. Drs. Meliton Rattan, and/or [place] were available for questions, if needed..    _______________________________________________________________________ For Office Staff:  Number of people involved in session: *** Was an Intern/ student involved with case: {YES/NO:63}

## 2023-08-23 ENCOUNTER — Inpatient Hospital Stay

## 2023-08-23 DIAGNOSIS — Z800 Family history of malignant neoplasm of digestive organs: Secondary | ICD-10-CM

## 2023-08-23 LAB — GENETIC SCREENING ORDER

## 2023-08-24 ENCOUNTER — Encounter: Payer: Self-pay | Admitting: Physical Medicine and Rehabilitation

## 2023-08-25 DIAGNOSIS — F411 Generalized anxiety disorder: Secondary | ICD-10-CM | POA: Diagnosis not present

## 2023-08-29 NOTE — Telephone Encounter (Signed)
did

## 2023-09-01 ENCOUNTER — Other Ambulatory Visit (HOSPITAL_COMMUNITY): Payer: Self-pay

## 2023-09-11 ENCOUNTER — Ambulatory Visit: Payer: Self-pay | Admitting: Genetic Counselor

## 2023-09-11 DIAGNOSIS — Z803 Family history of malignant neoplasm of breast: Secondary | ICD-10-CM

## 2023-09-12 ENCOUNTER — Inpatient Hospital Stay

## 2023-09-12 DIAGNOSIS — Z803 Family history of malignant neoplasm of breast: Secondary | ICD-10-CM | POA: Diagnosis not present

## 2023-09-12 DIAGNOSIS — Z800 Family history of malignant neoplasm of digestive organs: Secondary | ICD-10-CM | POA: Diagnosis not present

## 2023-09-12 LAB — GENETIC SCREENING ORDER

## 2023-09-25 DIAGNOSIS — F411 Generalized anxiety disorder: Secondary | ICD-10-CM | POA: Diagnosis not present

## 2023-09-26 ENCOUNTER — Encounter: Payer: Self-pay | Admitting: Genetic Counselor

## 2023-09-26 ENCOUNTER — Ambulatory Visit: Payer: Self-pay | Admitting: Genetic Counselor

## 2023-09-26 ENCOUNTER — Telehealth: Payer: Self-pay | Admitting: Genetic Counselor

## 2023-09-26 DIAGNOSIS — Z1379 Encounter for other screening for genetic and chromosomal anomalies: Secondary | ICD-10-CM | POA: Insufficient documentation

## 2023-09-26 NOTE — Progress Notes (Signed)
 GENETIC TEST RESULTS   Patient Name: Ashley Marshall Patient Age: 53 y.o. Encounter Date: 09/26/2023  Referring Provider: Denna Fish, MD   Ms. Leopold was seen in the Cancer Genetics clinic due to a family history of cancer and concern regarding a hereditary predisposition to cancer in the family. Please refer to the prior Genetics clinic note for more information regarding Ms. Klus's medical and family histories and our assessment at the time.   FAMILY HISTORY:  We obtained a detailed, 4-generation family history.  Significant diagnoses are listed below: Family History  Problem Relation Age of Onset   Diabetes Father    Heart attack Maternal Uncle    Breast cancer Paternal Aunt 49   Colon cancer Paternal Aunt 53   Pancreatic cancer Paternal Uncle        pancreatic   Heart attack Maternal Grandmother    Heart attack Maternal Grandfather    Diabetes Paternal Grandmother    Colon cancer Paternal Grandmother 57   Colon cancer Paternal Grandfather 48      The patient has a son and daughter who are cancer free.  She has a sister who is cancer free.  Both parents are deceased.   The patients father died of an AVM at 70.  He had three brothers and a sister.  His sister had breast and colon cancer and one brother had pancreatic cancer.  Both of his parents are colon cancer.   The patient's mother died at 11.  She had a brother who died of a heart attack. Both parents died of a heart attack.   Ms. Scanlon is unaware of previous family history of genetic testing for hereditary cancer risks. There is no reported Ashkenazi Jewish ancestry. There is no known consanguinity.  GENETIC TESTING:  Genetic testing reported out on Sep 25, 2023 through the [facility]-Expanded[facility]RNAinsight panel.  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      Genetic testing did identify a variant of uncertain significance (VUS) in the MBD4 gene  called c.336C>T.  At this time, it is unknown if this variant is associated with increased cancer risk or if this is a normal finding, but most variants such as this get reclassified to being inconsequential. It should not be used to make medical management decisions. With time, we suspect the lab will determine the significance of this variant, if any. If we do learn more about it, we will try to contact Ms. Forsberg to discuss it further. However, it is important to stay in touch with us  periodically and keep the address and phone number up to date.  CHEK2 CANCER RISKS & RECOMMENDATIONS:  We discussed that CHEK2 mutations have been found to be associated with an increased risk of breast, prostate, and possibly other cancers. It is important to distinguish that Ms. Chasen's specific mutation (p.I157T (c.470T>C)) has been shown to confer a risk for breast cancer that is less than the breast cancer risk for other mutations in the CHEK2 gene. Thus, her mutation is classified as a low penetrance mutation. Studies in [place] of this low penetrance mutation (p.I157T (c.470T>C)) estimated the lifetime risk for breast cancer among carriers to be increased by about 50% over the general population risk.  For those of average breast cancer risk, this low penetrance mutation in isolation is not expected to increase one's breast cancer risk >20%. The risk for contralateral breast cancer has not been well studied in women with the  p.I157T (c.470T>C) mutation.   While men have a possible increased prostate cancer risk with CHEK2 variants, in general, additional cancer risk management based on this moderate risk variant is not recommended.   Cancer risks in families with the same CHEK2 mutation can vary widely, suggesting that there are likely to be other factors involved that we don't understand yet that also contribute to the cancer risks associated with CHEK2 mutations. An individual's cancer risk and medical management  are not determined by genetic test results alone. Overall cancer risk assessment incorporates additional factors, including personal medical history, family history, and any available genetic information that may result in a personalized plan for cancer prevention and surveillance.   According to the [employer] [phone]) and ACMG [phone]; PMID: [id number]).   Breast Cancer Management:   Low penetrance variants in CHEK2 are not clinically actionable in isolation for breast cancer risk.  Breast surveillance should be based on personalized assessment including family history as a minimum    Additional cancer risk management based on these variants is not recommended.   Implications for Family Members: Hereditary predisposition to cancer due to pathogenic variants in the CHEK2 gene has autosomal dominant inheritance. This means that an individual with a pathogenic variant has a 50% chance of passing the condition on to his/her offspring. Identification of a pathogenic variant allows for the recognition of at-risk relatives who can pursue testing for the familial variant.   Family members are encouraged to consider genetic testing for this familial pathogenic variant. As there are generally no childhood cancer risks associated with single pathogenic variants in the CHEK2 gene, individuals in the family are not recommended to have testing until they reach at least 54 years of age. Complimentary testing for the familial variant is available for 90 days. They may contact our office at [phone] for more information or to schedule an appointment. Family members who live outside of the area are encouraged to find a genetic counselor in their area by visiting: [email or web address].   Resources: FORCE (Facing Our Risk of Cancer Empowered) is a resource for those with a hereditary predisposition to develop cancer.  FORCE provides information about risk reduction,  advocacy, legislation, and clinical trials.  Additionally, FORCE provides a platform for collaboration and support; which includes: peer navigation, message boards, local support groups, a toll-free helpline, research registry and recruitment, advocate training, published medical research, webinars, brochures, mastectomy photos, and more.  For more information, visit www.facingourrisk.org  Our contact number was provided. Ms. Penfold questions were answered to her satisfaction, and she knows she is welcome to call us  at anytime with additional questions or concerns.    Marijo Shove, MS, CGC  Licensed, Patent attorney [email or web address] phone: [phone]

## 2023-09-26 NOTE — Telephone Encounter (Signed)
 Revealed that Ashley Marshall tested positive for a moderate risk allele in CHEK2.  While most CHEK2 variants increase the risk for breast cancer and a few other cancers so that medical management needs to be changed, the moderate risk allele does not increase the risk for cancer enough to change medical management.  It is good to know if you carry this allele since information about these risks changes over time.  If medical management changes are needed in the future it is easier to notify those who have this allele.  Family members can be tested in the next 90 days for this allele for free through the family variant program.  MBD4 VUS was found. No medical management changes based on this allele.

## 2023-09-27 ENCOUNTER — Encounter (INDEPENDENT_AMBULATORY_CARE_PROVIDER_SITE_OTHER): Payer: 59 | Admitting: Ophthalmology

## 2023-09-27 DIAGNOSIS — H43813 Vitreous degeneration, bilateral: Secondary | ICD-10-CM | POA: Diagnosis not present

## 2023-09-27 DIAGNOSIS — H33301 Unspecified retinal break, right eye: Secondary | ICD-10-CM | POA: Diagnosis not present

## 2023-09-27 DIAGNOSIS — H338 Other retinal detachments: Secondary | ICD-10-CM | POA: Diagnosis not present

## 2023-10-10 DIAGNOSIS — Z1589 Genetic susceptibility to other disease: Secondary | ICD-10-CM | POA: Diagnosis not present

## 2023-10-10 DIAGNOSIS — Z803 Family history of malignant neoplasm of breast: Secondary | ICD-10-CM | POA: Diagnosis not present

## 2023-10-10 DIAGNOSIS — R350 Frequency of micturition: Secondary | ICD-10-CM | POA: Diagnosis not present

## 2023-10-23 ENCOUNTER — Encounter: Attending: Physical Medicine and Rehabilitation | Admitting: Physical Medicine and Rehabilitation

## 2023-10-23 ENCOUNTER — Encounter: Payer: Self-pay | Admitting: Physical Medicine and Rehabilitation

## 2023-10-23 VITALS — BP 121/80 | HR 104 | Ht 62.5 in | Wt 143.0 lb

## 2023-10-23 DIAGNOSIS — R768 Other specified abnormal immunological findings in serum: Secondary | ICD-10-CM | POA: Diagnosis not present

## 2023-10-23 DIAGNOSIS — N816 Rectocele: Secondary | ICD-10-CM | POA: Diagnosis not present

## 2023-10-23 DIAGNOSIS — R202 Paresthesia of skin: Secondary | ICD-10-CM | POA: Diagnosis not present

## 2023-10-23 NOTE — Progress Notes (Signed)
 Subjective:    Patient ID: Ashley Marshall, female    DOB: Mar 19, 1971, 53 y.o.   MRN: [id number]  HPI [name] is a a 53 year old woman who presents to establish care for neck pain.  1) Neck pain: Failed muscle relaxer, discussed that she does not want neuropathic pain medication, dry needling, physical therapy -had a massaged last July and left sided neck pain afterward -she went to Dr. Rozelle Corning -sometimes she felt that someone was standing on my shoulder blade -the only think that seems to help is compression -she was a neurologist and was told the pain is either muscular or nerve related -burns and stings and times or feels numb at times -she has had good response to dry needling  2) Rectocele -she tries to avoid constipation   Pain Inventory Average Pain 3 Pain Right Now 3 My pain is intermittent, burning, dull, and tingling  In the last 24 hours, has pain interfered with the following? General activity 3 Relation with others 3 Enjoyment of life 4 What TIME of day is your pain at its worst? daytime and night Sleep (in general) Fair  Pain is worse with: walking and some activites Pain improves with: rest and compression  Relief from Meds: a little  walk without assistance how many minutes can you walk? No limit ability to climb steps?  yes do you drive?  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  yes  numbness tingling spasms  Any changes since last visit?  yes CT/MRI August 08, 2023  Any changes since last visit?  yes    Family History  Problem Relation Age of Onset   Diabetes Father    Heart attack Maternal Uncle    Breast cancer Paternal Aunt 36   Colon cancer Paternal Aunt 13   Pancreatic cancer Paternal Uncle        pancreatic   Heart attack Maternal Grandmother    Heart attack Maternal Grandfather    Diabetes Paternal Grandmother    Colon cancer Paternal Grandmother 68   Colon  cancer Paternal Grandfather 65   Social History   Socioeconomic History   Marital status: Married    Spouse name: Not on file   Number of children: Not on file   Years of education: Not on file   Highest education level: Not on file  Occupational History   Not on file  Tobacco Use   Smoking status: Never   Smokeless tobacco: Never  Vaping Use   Vaping status: Never Used  Substance and Sexual Activity   Alcohol use: Not Currently    Alcohol/week: 1.0 standard drink of alcohol    Types: 1 Glasses of wine per week    Comment: i glass a week of wine   Drug use: No   Sexual activity: Not Currently    Partners: Male    Birth control/protection: Post-menopausal    Comment: 1st intercourse- 22, partners- 2  Other Topics Concern   Not on file  Social History Narrative   Are you right handed or left handed? Right   Are you currently employed ?    What is your current occupation? [employer]   Do you live at home alone?   Who lives with you? family   What type of home do you live in: 1 story or 2 story? two   Caffeine 15 oz of soda  Social Drivers of Corporate investment banker Strain: Not on file  Food Insecurity: Not on file  Transportation Needs: Not on file  Physical Activity: Not on file  Stress: Not on file  Social Connections: Not on file   Past Surgical History:  Procedure Laterality Date   CATARACT EXTRACTION Bilateral    COLONOSCOPY     x 2 -hx polyps   COLPOSCOPY     DILATATION & CURETTAGE/HYSTEROSCOPY WITH MYOSURE N/A 11/04/2016   Procedure: DILATATION & CURETTAGE/HYSTEROSCOPY WITH MYOSURE;  Surgeon: Percy Bracken, MD;  Location: [facility];  Service: Gynecology;  Laterality: N/A;   RETINAL DETACHMENT SURGERY Left 2000   ROBOTIC ASSISTED LAPAROSCOPIC OVARIAN CYSTECTOMY Left 11/04/2016   Procedure: ROBOTIC ASSISTED LAPAROSCOPIC OVARIAN CYSTECTOMY;  Surgeon: Lavoie, Marie-Lyne, MD;  Location: [facility];  Service: Gynecology;  Laterality: Left;  RNFA confirmed on  10/26/16 with chass   ROBOTIC ASSISTED SALPINGO OOPHERECTOMY Right 11/04/2016   Procedure: ROBOTIC ASSISTED SALPINGO OOPHORECTOMY;  Surgeon: Percy Bracken, MD;  Location: [facility];  Service: Gynecology;  Laterality: Right;   WISDOM TOOTH EXTRACTION     Past Medical History:  Diagnosis Date   Constipation    Family history of breast cancer    Family history of colon cancer    Family history of pancreatic cancer    GERD (gastroesophageal reflux disease)    no meds - diet controlled   Herpes    History of kidney stones    passed stones - no surgery required   HPV in female    Hyperlipidemia    diet controlled - no meds   Missed ab    no surgery required   Seasonal allergies    SVD (spontaneous vaginal delivery)    x 2   Ht 5' 2.5" (1.588 m)   Wt 143 lb (64.9 kg)   LMP 09/02/2018   BMI 25.74 kg/m   Opioid Risk Score:   Fall Risk Score:  `1  Depression screen [facility] 2/9     10/23/2023    9:11 AM 02/17/2021    3:17 PM  Depression screen PHQ 2/9  Decreased Interest 0 0  Down, Depressed, Hopeless 1 0  PHQ - 2 Score 1 0  Altered sleeping 1   Tired, decreased energy 1   Change in appetite 0   Feeling bad or failure about yourself  1   Trouble concentrating 0   Moving slowly or fidgety/restless 0   Suicidal thoughts 0   PHQ-9 Score 4     Review of Systems  Musculoskeletal:        Upper left scapular area  All other systems reviewed and are negative.      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 Discussed that this is a capsaicin patch, stronger than capsaicin cream. Discussed that it is currently approved for diabetic peripheral neuropathy and post-herpetic neuralgia, but that it  has also shown benefit in treating other forms of neuropathy. Provided patient with link to site to learn more about the patch: https://www.clark.biz/. Discussed that the patch would be placed in office and benefits usually last 3 months. Discussed that unintended exposure to capsaicin can cause severe irritation of eyes, mucous membranes, respiratory tract, and skin, but that Qutenza is a local treatment and does not have the systemic side effects of other nerve medications. Discussed that there may be pain, itching, erythema, and decreased sensory function associated with the application of Qutenza. Side effects usually subside within 1 week. A cold pack of analgesic medications can help with these side effects. Blood pressure can also be increased due to pain associated with administration of the patch.   -Discussed current symptoms of pain and history of pain.  -Discussed benefits of exercise in reducing pain. -Discussed following foods that may reduce pain: 1) Ginger (especially studied for arthritis)- reduce leukotriene production to decrease inflammation 2) Blueberries- high in phytonutrients that decrease inflammation 3) Salmon- marine omega-3s reduce joint swelling and pain 4) Pumpkin seeds- reduce inflammation 5) dark chocolate- reduces inflammation 6) turmeric- reduces inflammation 7) tart cherries - reduce pain and stiffness 8) extra virgin olive oil - its compound olecanthal helps to block prostaglandins  9) chili peppers- can be eaten or applied topically via capsaicin 10) mint- helpful for headache, muscle aches, joint  pain, and itching 11) garlic- reduces inflammation 12) Green tea- reduces inflammation and oxidative stress, helps with weight loss, may reduce the risk of cancer, recommend Double [employer] of Tea daily  Link to further information on diet for chronic pain: [email or web address].bray.[email or web address]   2) Retrocele: -encouraged diet rich in fruits and vegetables to prevent constipation  3) Positive ANA -discussed mechanism of action of low dose naltrexone as an opioid receptor antagonist which stimulates your body's production of its own natural endogenous opioids, helping to decrease pain. Discussed that it can also decrease T cell response and thus be helpful in decreasing inflammation, and symptoms of brain fog, fatigue, anxiety, depression, and allergies. Discussed that this medication needs to be compounded at a compounding pharmacy and can more expensive. Discussed that I usually start at 1mg  and if this is not providing enough relief then I titrate upward on a monthly basis.   -encouraged diet rich in fruits and vegetables  -avoid processed foots, gluten, dairy, eggs

## 2023-10-23 NOTE — Patient Instructions (Signed)
 Foods for pain: 1) Ginger (especially studied for arthritis)- reduce leukotriene production to decrease inflammation 2) Blueberries- high in phytonutrients that decrease inflammation 3) Salmon- marine omega-3s reduce joint swelling and pain 4) Pumpkin seeds- reduce inflammation 5) dark chocolate- reduces inflammation 6) turmeric- reduces inflammation 7) tart cherries - reduce pain and stiffness 8) extra virgin olive oil - its compound olecanthal helps to block prostaglandins  9) chili peppers- can be eaten or applied topically via capsaicin 10) mint- helpful for headache, muscle aches, joint pain, and itching 11) garlic- reduces inflammation 12) Green tea- reduces inflammation and oxidative stress, helps with weight loss, may reduce the risk of cancer, recommend Double Green [place] of Tea daily  Link to further information on diet for chronic pain: [email or web address]

## 2023-10-26 ENCOUNTER — Inpatient Hospital Stay: Attending: Genetic Counselor | Admitting: Hematology and Oncology

## 2023-10-26 VITALS — BP 132/72 | HR 96 | Temp 98.3°F | Resp 17 | Ht 62.5 in | Wt 144.3 lb

## 2023-10-26 DIAGNOSIS — Z1501 Genetic susceptibility to malignant neoplasm of breast: Secondary | ICD-10-CM | POA: Diagnosis not present

## 2023-10-26 DIAGNOSIS — Z9189 Other specified personal risk factors, not elsewhere classified: Secondary | ICD-10-CM | POA: Insufficient documentation

## 2023-10-26 DIAGNOSIS — Z1509 Genetic susceptibility to other malignant neoplasm: Secondary | ICD-10-CM | POA: Insufficient documentation

## 2023-10-26 DIAGNOSIS — Z800 Family history of malignant neoplasm of digestive organs: Secondary | ICD-10-CM | POA: Diagnosis not present

## 2023-10-26 NOTE — Progress Notes (Addendum)
 [facility] CONSULT NOTE  Patient Care Team: Ransom Byers, MD as PCP - General (Family Medicine) Hugh Madura, MD as PCP - Cardiology (Cardiology)  CHIEF COMPLAINTS/PURPOSE OF CONSULTATION:  CHEK-2 gene mutation  HISTORY OF PRESENTING ILLNESS: Ashley Marshall is 53 year old with recently diagnosed CHEK-2 gene mutation.  She has never had any breast problems and never had any biopsies.  She has had recent mammograms in December which were normal.  She does not report any pain lumps or nodules in the breast.  She has extensive family history of colon cancer and pancreatic cancer.  She has paternal aunt who had breast cancer.  I reviewed her records extensively and collaborated the history with the patient.    MEDICAL HISTORY:  Past Medical History:  Diagnosis Date   Constipation    Family history of breast cancer    Family history of colon cancer    Family history of pancreatic cancer    GERD (gastroesophageal reflux disease)    no meds - diet controlled   Herpes    History of kidney stones    passed stones - no surgery required   HPV in female    Hyperlipidemia    diet controlled - no meds   Missed ab    no surgery required   Seasonal allergies    SVD (spontaneous vaginal delivery)    x 2    SURGICAL HISTORY: Past Surgical History:  Procedure Laterality Date   CATARACT EXTRACTION Bilateral    COLONOSCOPY     x 2 -hx polyps   COLPOSCOPY     DILATATION & CURETTAGE/HYSTEROSCOPY WITH MYOSURE N/A 11/04/2016   Procedure: DILATATION & CURETTAGE/HYSTEROSCOPY WITH MYOSURE;  Surgeon: Lavoie, Marie-Lyne, MD;  Location: [facility];  Service: Gynecology;  Laterality: N/A;   RETINAL DETACHMENT SURGERY Left 2000   ROBOTIC ASSISTED LAPAROSCOPIC OVARIAN CYSTECTOMY Left 11/04/2016   Procedure: ROBOTIC ASSISTED LAPAROSCOPIC OVARIAN CYSTECTOMY;  Surgeon: Lavoie, Marie-Lyne, MD;  Location: [facility];  Service: Gynecology;  Laterality: Left;  RNFA confirmed on 10/26/16 with chass    ROBOTIC ASSISTED SALPINGO OOPHERECTOMY Right 11/04/2016   Procedure: ROBOTIC ASSISTED SALPINGO OOPHORECTOMY;  Surgeon: Percy Bracken, MD;  Location: [facility];  Service: Gynecology;  Laterality: Right;   WISDOM TOOTH EXTRACTION      SOCIAL HISTORY: Social History   Socioeconomic History   Marital status: Married    Spouse name: Not on file   Number of children: Not on file   Years of education: Not on file   Highest education level: Not on file  Occupational History   Not on file  Tobacco Use   Smoking status: Never   Smokeless tobacco: Never  Vaping Use   Vaping status: Never Used  Substance and Sexual Activity   Alcohol use: Not Currently    Alcohol/week: 1.0 standard drink of alcohol    Types: 1 Glasses of wine per week    Comment: i glass a week of wine   Drug use: No   Sexual activity: Not Currently    Partners: Male    Birth control/protection: Post-menopausal    Comment: 1st intercourse- 22, partners- 2  Other Topics Concern   Not on file  Social History Narrative   Are you right handed or left handed? Right   Are you currently employed ?    What is your current occupation? [employer]   Do you live at home alone?   Who lives with you? family   What type of home do  you live in: 1 story or 2 story? two   Caffeine 15 oz of soda    Social Drivers of Corporate investment banker Strain: Not on file  Food Insecurity: Not on file  Transportation Needs: Not on file  Physical Activity: Not on file  Stress: Not on file  Social Connections: Not on file  Intimate Partner Violence: Not on file    FAMILY HISTORY: Family History  Problem Relation Age of Onset   Diabetes Father    Heart attack Maternal Uncle    Breast cancer Paternal Aunt 68   Colon cancer Paternal Aunt 68   Pancreatic cancer Paternal Uncle        pancreatic   Heart attack Maternal Grandmother    Heart attack Maternal Grandfather    Diabetes Paternal Grandmother    Colon cancer Paternal  Grandmother 1   Colon cancer Paternal Grandfather 68    ALLERGIES:  is allergic to ambien [zolpidem tartrate], famotidine, metronidazole, septra [sulfamethoxazole-trimethoprim], and zolpidem.  MEDICATIONS:  Current Outpatient Medications  Medication Sig Dispense Refill   methocarbamol  (ROBAXIN ) 500 MG tablet Take 1 tablet (500 mg total) by mouth every 8 (eight) hours as needed for muscle spasms. (Patient not taking: Reported on 10/23/2023) 30 tablet 0   acetaminophen  (TYLENOL ) 500 MG tablet Take 500 mg by mouth every 6 (six) hours as needed.     cycloSPORINE  (RESTASIS ) 0.05 % ophthalmic emulsion INSTILL 1 DROP INTO BOTH EYES TWICE DAILY 180 each 12   cycloSPORINE  (RESTASIS ) 0.05 % ophthalmic emulsion Place 1 drop into both eyes 2 (two) times daily. 180 each 3   diazepam (DIASTAT ACUDIAL) 10 MG GEL Place rectally once.     FLUTICASONE  PROPIONATE, NASAL, NA      Ivermectin  (SOOLANTRA ) 1 % CREA Apply topically once daily for 30 days 45 g 3   MAGNESIUM PO Take by mouth.     omeprazole  (PRILOSEC) 20 MG capsule Take 1 capsule (20 mg total) by mouth 1/2(half) to 1 hour before morning meal 90 capsule 1   omeprazole  (PRILOSEC) 40 MG capsule Take 1 capsule (40 mg total) by mouth 2 (two) times daily. 60 capsule 3   polyethylene glycol (MIRALAX / GLYCOLAX) 17 g packet Take 17 g by mouth daily.     Probiotic Product (ALIGN PO) as directed Orally     SOOLANTRA  1 % CREA APPLY TO THE AFFECTED AREA(S) ONCE DAILY AS DIRECTED 45 g 3   trazodone  (DESYREL ) 300 MG tablet Q HS (Patient not taking: Reported on 10/23/2023)     traZODone  (DESYREL ) 50 MG tablet Take 1/2 tablet by mouth at bedtime as needed for restlessness 45 tablet 3   valACYclovir  (VALTREX ) 500 MG tablet Take 1 tablet (500 mg total) by mouth daily. Prophylaxis 90 tablet 4   VITAMIN D PO Take 1 tablet by mouth daily.     No current facility-administered medications for this visit.    REVIEW OF SYSTEMS:   Constitutional: Denies fevers, chills or  abnormal night sweats Breast:  Denies any palpable lumps or discharge All other systems were reviewed with the patient and are negative.  PHYSICAL EXAMINATION: ECOG PERFORMANCE STATUS: 0 - Asymptomatic  Vitals:   10/26/23 1527  BP: 132/72  Pulse: 96  Resp: 17  Temp: 98.3 F (36.8 C)  SpO2: 100%   Filed Weights   10/26/23 1527  Weight: 144 lb 4.8 oz (65.5 kg)    GENERAL:alert, no distress and comfortable    LABORATORY DATA:  I have reviewed  the data as listed Lab Results  Component Value Date   WBC 12.1 (H) 11/04/2016   HGB 12.4 11/04/2016   HCT 35.5 (L) 11/04/2016   MCV 94.9 11/04/2016   PLT 203 11/04/2016   RADIOGRAPHIC STUDIES: I have personally reviewed the radiological reports and agreed with the findings in the report.  ASSESSMENT AND PLAN:  At high risk for breast cancer CHEK 2 mutation: I discussed with her that based on the [facility] genetics report. Based on NCCN guidelines, this is a pathogenic mutation that has been associated with risk of not only breast cancer but also colon, thyroid  and kidney cancers.   Pathogenesis: I discussed with the patient that CHEK-2 encodes for a serine-threonine tyrosine kinase involved in the DNA repair in combination with ATM, BRCA1, P53 genes. Patients with mutation of the CHEK-2 gene results in the damaged DNAgetting replicated and increase the patient's risk for cancers.   Surveillance: I recommended annual mammograms and annual breast MRIs combined with monthly self breast examinations and breast examinations with her physician. Patient would like to continue the surveillance plan under the care of her gynecologist.   Based on NCCN guidelines, there is no data to support the role of prophylactic bilateral mastectomy for CHEK-2 mutation.   Lifetime risk of breast cancer in vary from 28% to 38%.   Other cancer surveillance: Apart of breast cancer, there are no definite surveillance approaches to kidney cancer. For colon cancer she  will need a colonoscopy and for thyroid  cancer she will need manual thyroid  examinations and thyroid  ultrasound every other year.  For breast cancer surveillance: Annual mammograms alternating with breast MRIs.  I will order a breast MRI to be done in June or July 2025.  We discussed the role of contrast-enhanced mammograms but for the current time being we will stick to MRIs.   Lifestyle modification: We discussed different interventions exercise at least 6 days a week including both aerobic as well as weight-bearing exercises and strength training. He discussed dietary modification including increasing number of servings of fruit and vegetables as well as decreased in number of servings of meat. We discussed the importance of maintaining a good BMI. Certainly patient eliminating or limiting alcohol consumption. We discussed smoking cessation are avoiding starting smoking habits.  Follow up: Patient will be seen back in 1 year time in followup.      All questions were answered. The patient knows to call the clinic with any problems, questions or concerns.    Viinay K Trask Vosler, MD 10/26/23

## 2023-10-26 NOTE — Assessment & Plan Note (Addendum)
 ATM gene mutation: I discussed with the patient that having ATM gene mutation leads to 2-3 times increased risk of breast cancer which translates into a 30% lifetime risk of breast cancer. She does not have any family history of breast cancer hence believe her risk is generally between 20-30%. There is also risk of colon cancer and pancreatic cancer as well as prostate cancer in men. I recommended that she undergo colonoscopy every 5 years.  For breast cancer surveillance: I offered the patient 2 options. She can undergo breast reduction bilateral mastectomies or she can undergo active surveillance with mammogram and breast MRI every year. At this point patient chose to undergo active surveillance with mammograms and annual breast MRIs.   1.  Breast cancer risk: Tyrer-Cuzick: 10-year risk: Lifetime risk: 5-year risk:  2. Risk reduction strategies: We discussed different risk reducing strategies for future breast cancer risk. We discussed chemoprevention and lifestyle modification. We discussed role of ongoing surveillance with mammograms versus MRIs.  3. Lifestyle modification: We discussed different interventions exercise at least 6 days a week including both aerobic as well as weight-bearing exercises and strength training. He discussed dietary modification including increasing number of servings of fruit and vegetables as well as decreased in number of servings of meat. We discussed the importance of maintaining a good BMI. Certainly patient eliminating or limiting alcohol consumption. We discussed smoking cessation are avoiding starting smoking habits.  4. Surveillance: Patient certainly would be a good candidate for ongoing mammograms and breast ultrasound alternatingly on a yearly basis. We discussed self breast examination as well as ongoing clinical examination.   6. Genetics: due to family history I have recommended that she be seen by our genetic counselor for counseling and for possible  testing.  7. Followup: Patient will be seen back in 1 year time in followup.

## 2023-10-27 ENCOUNTER — Encounter: Payer: Self-pay | Admitting: Hematology and Oncology

## 2023-10-27 DIAGNOSIS — F411 Generalized anxiety disorder: Secondary | ICD-10-CM | POA: Diagnosis not present

## 2023-11-01 ENCOUNTER — Other Ambulatory Visit (HOSPITAL_COMMUNITY): Payer: Self-pay

## 2023-11-13 ENCOUNTER — Ambulatory Visit: Payer: 59 | Admitting: Obstetrics and Gynecology

## 2023-11-17 DIAGNOSIS — F411 Generalized anxiety disorder: Secondary | ICD-10-CM | POA: Diagnosis not present

## 2023-11-20 ENCOUNTER — Ambulatory Visit: Payer: 59 | Admitting: Obstetrics and Gynecology

## 2023-11-21 ENCOUNTER — Encounter: Payer: Self-pay | Admitting: Obstetrics and Gynecology

## 2023-11-21 ENCOUNTER — Other Ambulatory Visit (HOSPITAL_COMMUNITY)
Admission: RE | Admit: 2023-11-21 | Discharge: 2023-11-21 | Disposition: A | Discharge status: Home / Self Care | Source: Ambulatory Visit | Attending: Obstetrics and Gynecology | Admitting: Obstetrics and Gynecology

## 2023-11-21 ENCOUNTER — Ambulatory Visit (INDEPENDENT_AMBULATORY_CARE_PROVIDER_SITE_OTHER): Admitting: Obstetrics and Gynecology

## 2023-11-21 ENCOUNTER — Other Ambulatory Visit (HOSPITAL_COMMUNITY): Payer: Self-pay

## 2023-11-21 VITALS — BP 132/84 | HR 94 | Ht 62.0 in | Wt 144.0 lb

## 2023-11-21 DIAGNOSIS — Z01419 Encounter for gynecological examination (general) (routine) without abnormal findings: Secondary | ICD-10-CM

## 2023-11-21 DIAGNOSIS — E2839 Other primary ovarian failure: Secondary | ICD-10-CM | POA: Diagnosis not present

## 2023-11-21 DIAGNOSIS — Z1331 Encounter for screening for depression: Secondary | ICD-10-CM | POA: Diagnosis not present

## 2023-11-21 MED ORDER — VALACYCLOVIR HCL 500 MG PO TABS
500.0000 mg | ORAL_TABLET | Freq: Every day | ORAL | 4 refills | Status: AC
  Filled 2023-11-21 – 2024-01-29 (×2): qty 90, 90d supply, fill #0
  Filled 2024-04-29: qty 90, 90d supply, fill #1

## 2023-11-21 NOTE — Progress Notes (Signed)
 53 y.o. y.o. female here for annual exam. Patient's last menstrual period was 09/02/2018.      [id number] Married.  Vasectomy.  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  Daughter 54, son 72.   RP:  Established patient presenting for annual gyn exam   Has endometriosis. Right ovary removed. Left ovary and uterus intact.  HPI: Postmenopause since 2020, well on no HRT.  No PMB.  No pelvic pain. Rarely sexually active, no pain with IC.  Pap Neg 10/2020.  Reports remote abnormal Pap with HPV.  Paps every 3 yrs.  Had a genital HSV recurrence.  Will start on Valacyclovir  prophylaxis.  Breasts normal Has Check mutation and needs MRI. MRI breast 12/04/23 BMI 26.1 last visit. Walking for fitness.  Seen by Dr Donnice Urogynecology: Pelvic floor dysfunction, did PT and vaginal Valium, possible mild IC.  Cysto-rectocele with mild stable symptoms, prefers observation. Health labs with Fam MD. Levis neg 2021. Dxa: to get baseline. Mother with osteoporosis. No personal fractures. Kyphosis of back noted  Body mass index is 26.34 kg/m.     11/21/2023    9:22 AM 10/23/2023    9:11 AM 02/17/2021    3:17 PM  Depression screen PHQ 2/9  Decreased Interest 1 0 0  Down, Depressed, Hopeless 0 1 0  PHQ - 2 Score 1 1 0  Altered sleeping  1   Tired, decreased energy  1   Change in appetite  0   Feeling bad or failure about yourself   1   Trouble concentrating  0   Moving slowly or fidgety/restless  0   Suicidal thoughts  0   PHQ-9 Score  4     Blood pressure 132/84, pulse 94, height 5' 2 (1.575 m), weight 144 lb (65.3 kg), last menstrual period 09/02/2018, SpO2 99%.     Component Value Date/Time   DIAGPAP  10/16/2020 1347    - Negative for Intraepithelial Lesions or Malignancy (NILM)   DIAGPAP - Benign reactive/reparative changes 10/16/2020 1347   ADEQPAP  10/16/2020 1347    Satisfactory for evaluation; transformation zone component PRESENT.    GYN HISTORY:    Component Value Date/Time    DIAGPAP  10/16/2020 1347    - Negative for Intraepithelial Lesions or Malignancy (NILM)   DIAGPAP - Benign reactive/reparative changes 10/16/2020 1347   ADEQPAP  10/16/2020 1347    Satisfactory for evaluation; transformation zone component PRESENT.    OB History  Gravida Para Term Preterm AB Living  3 2   1 2   SAB IAB Ectopic Multiple Live Births  1        # Outcome Date GA Lbr Len/2nd Weight Sex Type Anes PTL Lv  3 SAB           2 Para           1 Para             Past Medical History:  Diagnosis Date   Constipation    Family history of breast cancer    Family history of colon cancer    Family history of pancreatic cancer    GERD (gastroesophageal reflux disease)    no meds - diet controlled   Herpes    History of kidney stones    passed stones - no surgery required   HPV in female    Hyperlipidemia    diet controlled - no meds   Missed ab    no surgery required  Seasonal allergies    SVD (spontaneous vaginal delivery)    x 2    Past Surgical History:  Procedure Laterality Date   CATARACT EXTRACTION Bilateral    COLONOSCOPY     x 2 -hx polyps   COLPOSCOPY     DILATATION & CURETTAGE/HYSTEROSCOPY WITH MYOSURE N/A 11/04/2016   Procedure: DILATATION & CURETTAGE/HYSTEROSCOPY WITH MYOSURE;  Surgeon: Lavoie, Marie-Lyne, MD;  Location: [facility];  Service: Gynecology;  Laterality: N/A;   RETINAL DETACHMENT SURGERY Left 2000   ROBOTIC ASSISTED LAPAROSCOPIC OVARIAN CYSTECTOMY Left 11/04/2016   Procedure: ROBOTIC ASSISTED LAPAROSCOPIC OVARIAN CYSTECTOMY;  Surgeon: Lavoie, Marie-Lyne, MD;  Location: [facility];  Service: Gynecology;  Laterality: Left;  RNFA confirmed on 10/26/16 with chass   ROBOTIC ASSISTED SALPINGO OOPHERECTOMY Right 11/04/2016   Procedure: ROBOTIC ASSISTED SALPINGO OOPHORECTOMY;  Surgeon: Georgia Blonder, MD;  Location: [facility];  Service: Gynecology;  Laterality: Right;   WISDOM TOOTH EXTRACTION      Current Outpatient Medications on File Prior to Visit   Medication Sig Dispense Refill   acetaminophen  (TYLENOL ) 500 MG tablet Take 500 mg by mouth every 6 (six) hours as needed.     cycloSPORINE  (RESTASIS ) 0.05 % ophthalmic emulsion INSTILL 1 DROP INTO BOTH EYES TWICE DAILY 180 each 12   cycloSPORINE  (RESTASIS ) 0.05 % ophthalmic emulsion Place 1 drop into both eyes 2 (two) times daily. 180 each 3   diazepam (DIASTAT ACUDIAL) 10 MG GEL Place rectally once.     FLUTICASONE  PROPIONATE, NASAL, NA      Ivermectin  (SOOLANTRA ) 1 % CREA Apply topically once daily for 30 days 45 g 3   MAGNESIUM PO Take by mouth.     methocarbamol  (ROBAXIN ) 500 MG tablet Take 1 tablet (500 mg total) by mouth every 8 (eight) hours as needed for muscle spasms. (Patient not taking: Reported on 11/21/2023) 30 tablet 0   omeprazole  (PRILOSEC) 20 MG capsule Take 1 capsule (20 mg total) by mouth 1/2(half) to 1 hour before morning meal 90 capsule 1   omeprazole  (PRILOSEC) 40 MG capsule Take 1 capsule (40 mg total) by mouth 2 (two) times daily. 60 capsule 3   polyethylene glycol (MIRALAX / GLYCOLAX) 17 g packet Take 17 g by mouth daily.     Probiotic Product (ALIGN PO) as directed Orally     SOOLANTRA  1 % CREA APPLY TO THE AFFECTED AREA(S) ONCE DAILY AS DIRECTED 45 g 3   trazodone  (DESYREL ) 300 MG tablet Q HS     traZODone  (DESYREL ) 50 MG tablet Take 1/2 tablet by mouth at bedtime as needed for restlessness 45 tablet 3   valACYclovir  (VALTREX ) 500 MG tablet Take 1 tablet (500 mg total) by mouth daily. Prophylaxis 90 tablet 4   VITAMIN D PO Take 1 tablet by mouth daily.     No current facility-administered medications on file prior to visit.    Social History   Socioeconomic History   Marital status: Married    Spouse name: Not on file   Number of children: Not on file   Years of education: Not on file   Highest education level: Not on file  Occupational History   Not on file  Tobacco Use   Smoking status: Never   Smokeless tobacco: Never  Vaping Use   Vaping status:  Never Used  Substance and Sexual Activity   Alcohol use: Not Currently    Alcohol/week: 1.0 standard drink of alcohol    Types: 1 Glasses of wine per week    Comment:  i glass a week of wine   Drug use: No   Sexual activity: Not Currently    Partners: Male    Birth control/protection: Post-menopausal    Comment: 1st intercourse- 22, partners- 2  Other Topics Concern   Not on file  Social History Narrative   Are you right handed or left handed? Right   Are you currently employed ?    What is your current occupation? [employer]   Do you live at home alone?   Who lives with you? family   What type of home do you live in: 1 story or 2 story? two   Caffeine 15 oz of soda    Social Drivers of Corporate investment banker Strain: Not on file  Food Insecurity: Not on file  Transportation Needs: Not on file  Physical Activity: Not on file  Stress: Not on file  Social Connections: Not on file  Intimate Partner Violence: Not on file    Family History  Problem Relation Age of Onset   Diabetes Father    Early death Father    Heart attack Maternal Uncle    Breast cancer Paternal Aunt 60   Colon cancer Paternal Aunt 55   Pancreatic cancer Paternal Uncle        pancreatic   Heart attack Maternal Grandmother    Heart attack Maternal Grandfather    Diabetes Paternal Grandmother    Colon cancer Paternal Grandmother 52   Colon cancer Paternal Grandfather 30     Allergies  Allergen Reactions   Ambien [Zolpidem Tartrate] Other (See Comments)    hallucinations   Famotidine     Other Reaction(s): Other (See Comments)  Depression   Metronidazole Other (See Comments)    neuropathy  NDC Code:00025182131  NDC Code:00025182131   Septra [Sulfamethoxazole-Trimethoprim] Other (See Comments)    thrush   Zolpidem     Other Reaction(s): Unknown  NDC Rniz:[id number]  NDC Rniz:[id number]      Patient's last menstrual period was Patient's last menstrual period was 09/02/2018.SABRA             Review of Systems Alls systems reviewed and are negative.     Physical Exam Constitutional:      Appearance: Normal appearance.  Genitourinary:     Vulva and urethral meatus normal.     No lesions in the vagina.     Right Labia: No rash, lesions or skin changes.    Left Labia: No lesions, skin changes or rash.    No vaginal discharge or tenderness.     Apical vaginal prolapse present.    No vaginal atrophy present.     Right Adnexa: not tender, not palpable and no mass present.    Left Adnexa: not tender, not palpable and no mass present.    No cervical motion tenderness or discharge.     Uterus is not enlarged, tender or irregular.  Breasts:    Right: Normal.     Left: Normal.  HENT:     Head: Normocephalic.  Neck:     Thyroid : No thyroid  mass, thyromegaly or thyroid  tenderness.  Cardiovascular:     Rate and Rhythm: Normal rate and regular rhythm.     Heart sounds: Normal heart sounds, S1 normal and S2 normal.  Pulmonary:     Effort: Pulmonary effort is normal.     Breath sounds: Normal breath sounds and air entry.  Abdominal:     General: There is no distension.  Palpations: Abdomen is soft. There is no mass.     Tenderness: There is no abdominal tenderness. There is no guarding or rebound.  Musculoskeletal:        General: Normal range of motion.     Cervical back: Full passive range of motion without pain, normal range of motion and neck supple. No tenderness.     Right lower leg: No edema.     Left lower leg: No edema.  Neurological:     Mental Status: She is alert.  Skin:    General: Skin is warm.  Psychiatric:        Mood and Affect: Mood normal.        Behavior: Behavior normal.        Thought Content: Thought content normal.  Vitals and nursing note reviewed. Exam conducted with a chaperone present.       A:         Well Woman GYN exam                             P:        Pap smear collected today Encouraged annual mammogram  screening Colon cancer screening up-to-date DXA ordered today Labs and immunizations to do with PMD Discussed breast self exams Encouraged healthy lifestyle practices Encouraged Vit D and Calcium   No follow-ups on file.  Ashley Marshall Carpen

## 2023-11-24 DIAGNOSIS — M79662 Pain in left lower leg: Secondary | ICD-10-CM | POA: Diagnosis not present

## 2023-11-24 DIAGNOSIS — M79604 Pain in right leg: Secondary | ICD-10-CM | POA: Diagnosis not present

## 2023-11-24 DIAGNOSIS — M79661 Pain in right lower leg: Secondary | ICD-10-CM | POA: Diagnosis not present

## 2023-11-24 DIAGNOSIS — I83893 Varicose veins of bilateral lower extremities with other complications: Secondary | ICD-10-CM | POA: Diagnosis not present

## 2023-11-24 DIAGNOSIS — I83892 Varicose veins of left lower extremities with other complications: Secondary | ICD-10-CM | POA: Diagnosis not present

## 2023-11-24 LAB — CYTOLOGY - PAP: Diagnosis: NEGATIVE

## 2023-11-27 ENCOUNTER — Ambulatory Visit: Payer: Self-pay | Admitting: Obstetrics and Gynecology

## 2023-11-27 ENCOUNTER — Other Ambulatory Visit (HOSPITAL_COMMUNITY): Payer: Self-pay

## 2023-12-04 ENCOUNTER — Ambulatory Visit
Admission: RE | Admit: 2023-12-04 | Discharge: 2023-12-04 | Disposition: A | Discharge status: Home / Self Care | Source: Ambulatory Visit | Attending: Hematology and Oncology

## 2023-12-04 ENCOUNTER — Ambulatory Visit: Payer: Self-pay | Admitting: Hematology and Oncology

## 2023-12-04 DIAGNOSIS — Z1509 Genetic susceptibility to other malignant neoplasm: Secondary | ICD-10-CM

## 2023-12-04 DIAGNOSIS — Z9189 Other specified personal risk factors, not elsewhere classified: Secondary | ICD-10-CM

## 2023-12-04 DIAGNOSIS — Z1501 Genetic susceptibility to malignant neoplasm of breast: Secondary | ICD-10-CM | POA: Diagnosis not present

## 2023-12-04 DIAGNOSIS — Z1239 Encounter for other screening for malignant neoplasm of breast: Secondary | ICD-10-CM | POA: Diagnosis not present

## 2023-12-04 MED ORDER — GADOPICLENOL 0.5 MMOL/ML IV SOLN
6.0000 mL | Freq: Once | INTRAVENOUS | Status: AC | PRN
  Administered 2023-12-04: 6 mL via INTRAVENOUS

## 2023-12-04 NOTE — Telephone Encounter (Signed)
I informed the patient that her breast MRI is normal.

## 2023-12-08 DIAGNOSIS — F411 Generalized anxiety disorder: Secondary | ICD-10-CM | POA: Diagnosis not present

## 2023-12-13 DIAGNOSIS — F411 Generalized anxiety disorder: Secondary | ICD-10-CM | POA: Diagnosis not present

## 2023-12-15 ENCOUNTER — Encounter: Admitting: Physical Medicine and Rehabilitation

## 2023-12-26 ENCOUNTER — Encounter: Payer: Self-pay | Admitting: Orthopedic Surgery

## 2023-12-26 ENCOUNTER — Ambulatory Visit (HOSPITAL_BASED_OUTPATIENT_CLINIC_OR_DEPARTMENT_OTHER)
Admission: RE | Admit: 2023-12-26 | Discharge: 2023-12-26 | Disposition: A | Discharge status: Home / Self Care | Source: Ambulatory Visit | Attending: Obstetrics and Gynecology | Admitting: Obstetrics and Gynecology

## 2023-12-26 ENCOUNTER — Encounter: Payer: Self-pay | Admitting: Physical Medicine and Rehabilitation

## 2023-12-26 ENCOUNTER — Telehealth: Payer: Self-pay | Admitting: Obstetrics and Gynecology

## 2023-12-26 DIAGNOSIS — Z01419 Encounter for gynecological examination (general) (routine) without abnormal findings: Secondary | ICD-10-CM | POA: Diagnosis not present

## 2023-12-26 DIAGNOSIS — G8929 Other chronic pain: Secondary | ICD-10-CM

## 2023-12-26 DIAGNOSIS — E2839 Other primary ovarian failure: Secondary | ICD-10-CM | POA: Insufficient documentation

## 2023-12-26 DIAGNOSIS — M810 Age-related osteoporosis without current pathological fracture: Secondary | ICD-10-CM | POA: Diagnosis not present

## 2023-12-26 DIAGNOSIS — Z780 Asymptomatic menopausal state: Secondary | ICD-10-CM | POA: Diagnosis not present

## 2023-12-26 NOTE — Telephone Encounter (Signed)
 Deep massage in back and has had nerve pain in her shoulder since. Left shoulder weakness has seen primary for this. T8 with atypical hemagioma.  Dxa with severe osteoporosis at -3.3 Denies fractures On no meds

## 2023-12-26 NOTE — Telephone Encounter (Signed)
 2018 last period Could not take calcium with GI issues, IBS, constipation Has reflux Is on vit D No fractures Mother was on fosamax Has reflux and gi issues Cannot take fosamax Goes to gym once a week  First bone scan with severe osteoporosis -3.3 Counseled on all options such as reclast, evenity, prolia. She would like to get PA started for evenity asap  In tears from deep massage felt like a broken rib and has seen her PMD and MRI. Atypical hemangioma found. I will place referral to ortho.  Counseled on fall precautions, limit alcohol and caffeine. Dr. Glennon

## 2023-12-28 ENCOUNTER — Encounter: Attending: Physical Medicine and Rehabilitation | Admitting: Physical Medicine and Rehabilitation

## 2023-12-28 DIAGNOSIS — M810 Age-related osteoporosis without current pathological fracture: Secondary | ICD-10-CM | POA: Insufficient documentation

## 2023-12-28 NOTE — Progress Notes (Signed)
 Subjective:    Patient ID: Levon LITTIE Sandifer, female    DOB: September 11, 1970, 53 y.o.   MRN: [id number]  HPI An audio/video tele-health visit is felt to be the most appropriate encounter for this patient at this time. This is a follow up tele-visit via phone. The patient is at home. MD is at office. Prior to scheduling this appointment, our staff discussed the limitations of evaluation and management by telemedicine and the availability of in-person appointments. The patient expressed understanding and agreed to proceed.   Mrs. Clagg is a a 53 year old woman who presents for follow-up regarding osteoporosis.  1) Neck pain: Failed muscle relaxer, discussed that she does not want neuropathic pain medication, dry needling, physical therapy -had a massaged last July and left sided neck pain afterward -she went to Dr. Addie -sometimes she felt that someone was standing on my shoulder blade -the only think that seems to help is compression -she was a neurologist and was told the pain is either muscular or nerve related -burns and stings and times or feels numb at times -she has had good response to dry needling  2) Rectocele -she tries to avoid constipation  3) Osteoporosis: -she is concerned about her recent T score -she asks what could have caused this -she prefers not to take osteoporosis medications as is worried about the cardiovascular side effects -   Pain Inventory Average Pain 3 Pain Right Now 3 My pain is intermittent, burning, dull, and tingling  In the last 24 hours, has pain interfered with the following? General activity 3 Relation with others 3 Enjoyment of life 4 What TIME of day is your pain at its worst? daytime and night Sleep (in general) Fair  Pain is worse with: walking and some activites Pain improves with: rest and compression  Relief from Meds: a little  walk without assistance how many minutes can you walk? No limit ability to climb steps?  yes do you  drive?  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  yes  numbness tingling spasms  Any changes since last visit?  yes CT/MRI August 08, 2023  Any changes since last visit?  yes    Family History  Problem Relation Age of Onset   Diabetes Father    Early death Father    Heart attack Maternal Uncle    Breast cancer Paternal Aunt 69   Colon cancer Paternal Aunt 33   Pancreatic cancer Paternal Uncle        pancreatic   Heart attack Maternal Grandmother    Heart attack Maternal Grandfather    Diabetes Paternal Grandmother    Colon cancer Paternal Grandmother 50   Colon cancer Paternal Grandfather 38   Social History   Socioeconomic History   Marital status: Married    Spouse name: Not on file   Number of children: Not on file   Years of education: Not on file   Highest education level: Not on file  Occupational History   Not on file  Tobacco Use   Smoking status: Never   Smokeless tobacco: Never  Vaping Use   Vaping status: Never Used  Substance and Sexual Activity   Alcohol use: Not Currently    Alcohol/week: 1.0 standard drink of alcohol    Types: 1 Glasses of wine per week    Comment: i glass a week of wine   Drug use: No  Sexual activity: Not Currently    Partners: Male    Birth control/protection: Post-menopausal    Comment: 1st intercourse- 22, partners- 2  Other Topics Concern   Not on file  Social History Narrative   Are you right handed or left handed? Right   Are you currently employed ?    What is your current occupation? [employer]   Do you live at home alone?   Who lives with you? family   What type of home do you live in: 1 story or 2 story? two   Caffeine 15 oz of soda    Social Drivers of Corporate investment banker Strain: Not on file  Food Insecurity: Not on file  Transportation Needs: Not on file  Physical Activity: Not on file  Stress: Not on file  Social Connections:  Not on file   Past Surgical History:  Procedure Laterality Date   CATARACT EXTRACTION Bilateral    COLONOSCOPY     x 2 -hx polyps   COLPOSCOPY     DILATATION & CURETTAGE/HYSTEROSCOPY WITH MYOSURE N/A 11/04/2016   Procedure: DILATATION & CURETTAGE/HYSTEROSCOPY WITH MYOSURE;  Surgeon: Georgia Blonder, MD;  Location: [facility];  Service: Gynecology;  Laterality: N/A;   RETINAL DETACHMENT SURGERY Left 2000   ROBOTIC ASSISTED LAPAROSCOPIC OVARIAN CYSTECTOMY Left 11/04/2016   Procedure: ROBOTIC ASSISTED LAPAROSCOPIC OVARIAN CYSTECTOMY;  Surgeon: Lavoie, Marie-Lyne, MD;  Location: [facility];  Service: Gynecology;  Laterality: Left;  RNFA confirmed on 10/26/16 with chass   ROBOTIC ASSISTED SALPINGO OOPHERECTOMY Right 11/04/2016   Procedure: ROBOTIC ASSISTED SALPINGO OOPHORECTOMY;  Surgeon: Georgia Blonder, MD;  Location: [facility];  Service: Gynecology;  Laterality: Right;   WISDOM TOOTH EXTRACTION     Past Medical History:  Diagnosis Date   Constipation    Endometriosis    Family history of breast cancer    Family history of colon cancer    Family history of pancreatic cancer    GERD (gastroesophageal reflux disease)    no meds - diet controlled   Herpes    History of kidney stones    passed stones - no surgery required   HPV in female    Hyperlipidemia    diet controlled - no meds   Missed ab    no surgery required   Seasonal allergies    SVD (spontaneous vaginal delivery)    x 2   LMP 09/02/2018   Opioid Risk Score:   Fall Risk Score:  `1  Depression screen [facility] 2/9     11/21/2023    9:22 AM 10/23/2023    9:11 AM 02/17/2021    3:17 PM  Depression screen PHQ 2/9  Decreased Interest 1 0 0  Down, Depressed, Hopeless 0 1 0  PHQ - 2 Score 1 1 0  Altered sleeping  1   Tired, decreased energy  1   Change in appetite  0   Feeling bad or failure about yourself   1   Trouble concentrating  0   Moving slowly or fidgety/restless  0   Suicidal thoughts  0   PHQ-9 Score  4     Review  of Systems  Musculoskeletal:        Upper left scapular area  All other systems reviewed and are negative.      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 Discussed that this is a capsaicin patch, stronger than capsaicin cream. Discussed that it is currently approved for diabetic peripheral neuropathy and post-herpetic neuralgia, but that it has also shown benefit in treating other forms of neuropathy. Provided patient with link to site to learn more about the patch: https://www.clark.biz/. Discussed that the patch would be placed in office and benefits usually last 3 months. Discussed that unintended exposure to capsaicin can cause severe irritation of eyes, mucous membranes, respiratory tract, and skin, but that Qutenza is a local treatment and does not have the systemic side effects of other nerve medications. Discussed that there may be pain, itching, erythema, and decreased sensory function associated with the application of Qutenza. Side effects usually subside within 1 week. A cold pack of analgesic medications can help with these side effects. Blood pressure can also be increased due to pain associated with administration of the patch.   -Discussed current symptoms of pain and history of pain.  -Discussed benefits of exercise in  reducing pain. -Discussed following foods that may reduce pain: 1) Ginger (especially studied for arthritis)- reduce leukotriene production to decrease inflammation 2) Blueberries- high in phytonutrients that decrease inflammation 3) Salmon- marine omega-3s reduce joint swelling and pain 4) Pumpkin seeds- reduce inflammation 5) dark chocolate- reduces inflammation 6) turmeric- reduces inflammation 7) tart cherries - reduce pain and stiffness 8) extra virgin olive oil - its compound olecanthal helps to block prostaglandins  9) chili peppers- can be eaten or applied topically via capsaicin 10) mint- helpful for headache, muscle aches, joint pain, and itching 11) garlic- reduces inflammation 12) Green tea- reduces inflammation and oxidative stress, helps with weight loss, may reduce the risk of cancer, recommend Double [employer] of Tea daily  Link to further information on diet for chronic pain: http://www.bray.com/   2) Retrocele: -encouraged diet rich in fruits and vegetables to prevent constipation  3) Positive ANA -discussed mechanism of action of low dose naltrexone as an opioid receptor antagonist which stimulates your body's production of its own natural endogenous opioids, helping to decrease pain. Discussed that it can also decrease T cell response and thus be helpful in decreasing inflammation, and symptoms of brain fog, fatigue, anxiety, depression, and allergies. Discussed that this medication needs to be compounded at a compounding pharmacy and can more expensive. Discussed that I usually start at 1mg  and if this is not providing enough relief then I titrate upward on a monthly basis.   -encouraged diet rich in fruits and vegetables  -avoid processed foots, gluten, dairy, eggs   4) Osteoporosis -discussed her recent diagnosis of osteoporosis, encouraged continued weightbearing exercise, discussed  that her chronic PPI use is a risk factor for osteoporosis as this can cause nutritional deficiencies, encouraged slowly decreasing frequency of use/dietary modifications, encouraged use of at least 6 prunes per day as this can improve her constipation/strengthen her bones  20 minutes spent in discussion of her recent diagnosis of osteoporosis, encouraged continued weightbearing exercise, discussed that her chronic PPI use is a risk  factor for osteoporosis as this can cause nutritional deficiencies, encouraged slowly decreasing frequency of use/dietary modifications, encouraged use of at least 6 prunes per day as this can improve her constipation/strengthen her bones

## 2024-01-01 ENCOUNTER — Other Ambulatory Visit (HOSPITAL_BASED_OUTPATIENT_CLINIC_OR_DEPARTMENT_OTHER)

## 2024-01-03 NOTE — Telephone Encounter (Signed)
 Call returned to patient. Asking for update on Evenity process.   Process of benefits reviewed, questions answered. Insurance information on file confirmed. Patient is aware [facility] will reach out with next steps. Patient appreciative of call.

## 2024-01-05 ENCOUNTER — Other Ambulatory Visit (HOSPITAL_COMMUNITY): Payer: Self-pay

## 2024-01-05 DIAGNOSIS — E7800 Pure hypercholesterolemia, unspecified: Secondary | ICD-10-CM | POA: Diagnosis not present

## 2024-01-05 DIAGNOSIS — E559 Vitamin D deficiency, unspecified: Secondary | ICD-10-CM | POA: Diagnosis not present

## 2024-01-05 DIAGNOSIS — R7301 Impaired fasting glucose: Secondary | ICD-10-CM | POA: Diagnosis not present

## 2024-01-05 DIAGNOSIS — Z0000 Encounter for general adult medical examination without abnormal findings: Secondary | ICD-10-CM | POA: Diagnosis not present

## 2024-01-05 DIAGNOSIS — Z79899 Other long term (current) drug therapy: Secondary | ICD-10-CM | POA: Diagnosis not present

## 2024-01-05 DIAGNOSIS — M810 Age-related osteoporosis without current pathological fracture: Secondary | ICD-10-CM | POA: Diagnosis not present

## 2024-01-05 DIAGNOSIS — R0683 Snoring: Secondary | ICD-10-CM | POA: Diagnosis not present

## 2024-01-05 LAB — LAB REPORT - SCANNED
A1c: 5.7
EGFR: 105

## 2024-01-05 MED ORDER — TRAZODONE HCL 50 MG PO TABS
25.0000 mg | ORAL_TABLET | Freq: Every evening | ORAL | 3 refills | Status: AC | PRN
  Filled 2024-01-05: qty 45, 90d supply, fill #0
  Filled 2024-01-29: qty 15, 30d supply, fill #0

## 2024-01-12 DIAGNOSIS — F411 Generalized anxiety disorder: Secondary | ICD-10-CM | POA: Diagnosis not present

## 2024-01-16 ENCOUNTER — Other Ambulatory Visit (HOSPITAL_COMMUNITY): Payer: Self-pay

## 2024-01-18 ENCOUNTER — Other Ambulatory Visit: Payer: Self-pay | Admitting: *Deleted

## 2024-01-18 DIAGNOSIS — M810 Age-related osteoporosis without current pathological fracture: Secondary | ICD-10-CM

## 2024-01-18 MED ORDER — ROMOSOZUMAB-AQQG 105 MG/1.17ML ~~LOC~~ SOSY: 210.0000 mg | PREFILLED_SYRINGE | Freq: Once | SUBCUTANEOUS | Status: DC

## 2024-01-22 DIAGNOSIS — R0683 Snoring: Secondary | ICD-10-CM | POA: Diagnosis not present

## 2024-01-22 DIAGNOSIS — G4719 Other hypersomnia: Secondary | ICD-10-CM | POA: Diagnosis not present

## 2024-01-29 ENCOUNTER — Other Ambulatory Visit (HOSPITAL_COMMUNITY): Payer: Self-pay

## 2024-02-02 ENCOUNTER — Encounter: Payer: Self-pay | Admitting: Obstetrics and Gynecology

## 2024-02-02 DIAGNOSIS — K2100 Gastro-esophageal reflux disease with esophagitis, without bleeding: Secondary | ICD-10-CM | POA: Diagnosis not present

## 2024-02-02 DIAGNOSIS — F411 Generalized anxiety disorder: Secondary | ICD-10-CM | POA: Diagnosis not present

## 2024-02-02 DIAGNOSIS — M818 Other osteoporosis without current pathological fracture: Secondary | ICD-10-CM | POA: Diagnosis not present

## 2024-02-02 DIAGNOSIS — Z87442 Personal history of urinary calculi: Secondary | ICD-10-CM | POA: Diagnosis not present

## 2024-02-05 ENCOUNTER — Encounter: Payer: Self-pay | Admitting: Physician Assistant

## 2024-02-05 ENCOUNTER — Ambulatory Visit (INDEPENDENT_AMBULATORY_CARE_PROVIDER_SITE_OTHER): Admitting: Physician Assistant

## 2024-02-05 ENCOUNTER — Encounter: Payer: Self-pay | Admitting: *Deleted

## 2024-02-05 ENCOUNTER — Encounter: Admitting: Physician Assistant

## 2024-02-05 VITALS — Ht 62.5 in | Wt 143.2 lb

## 2024-02-05 DIAGNOSIS — K219 Gastro-esophageal reflux disease without esophagitis: Secondary | ICD-10-CM | POA: Diagnosis not present

## 2024-02-05 DIAGNOSIS — M810 Age-related osteoporosis without current pathological fracture: Secondary | ICD-10-CM | POA: Diagnosis not present

## 2024-02-05 DIAGNOSIS — M818 Other osteoporosis without current pathological fracture: Secondary | ICD-10-CM | POA: Diagnosis not present

## 2024-02-05 NOTE — Progress Notes (Signed)
 Office Visit Note   Patient: Ashley Marshall           Date of Birth: Jun 14, 1970           MRN: [id number] Visit Date: 02/05/2024              Requested by: Addie Cordella Hamilton, MD [address] [place],  [place] [address] PCP: Chrystal Lamarr RAMAN, MD   Assessment & Plan: Visit Diagnoses:  1. Age-related osteoporosis without current pathological fracture     Plan: Patient is a 53 year old woman who works in Producer, television/film/video for [employer].  She is here with her husband today to discuss osteoporosis.  She has seen both an endocrinologist and was told to be on Evenity  based on her most recent bone density scan which was -3.3 T-score.  Unfortunately Evenity  is not approved and was suggested she try Prolia first.  She currently takes has taken vitamin D as she has a history of a vitamin D deficiency and magnesium.  She does have trouble tolerating most calcium supplements because of constipation.  She has no history of fracture in herself no history of heart disease though a family history.  She has no history of cancer though family history.  No history of kidney disease ulcers or gastric bypass.  She does have some laryngeal issues.  She does supplement with 2000 international units of vitamin D and her vitamin D has been stable.  She recently had an endocrinology workup her TSH and PTH were not out of the realms and normal.  She has not done hormone replacement therapy and underwent menopause at age 45 secondary to endometriosis and surgery.  She has never been a smoker she does not drink.  She does do some strength training and walking 2-3 times a week at the gym and is going to be seeing a physical therapist about a better more effective strengthening program.  No history with dental issues she has a mother who has a history of osteopenia.  I had a long discussion with her with regards to her choices.  Because of her issues laryngeal issues would probably recommend Reclast over Fosamax.  Given her bone  density score she is at a major risk factor of the next 10 years of 13% for osteoporotic fracture which is under the 20% however she is at a higher risk 6.4% of a hip fracture in the next 10 years.  This is over the 3%.  We talked a long time certainly she could try Reclast though I think Prolia would be a better drug for her.  She is also going to look into osteo strong.  I told her frankly I did not know much about them but there was nothing wrong and trying to get all the information she needed.  Gave her information about alternative calcium supplementation.  There are bio similar medications in the works and ideally if she could not be on Evenity  would recommend the biosimilar for Evenity  over Prolia or Reclast as I do think she needs an anabolic shelf she was given information her questions were answered she may follow-up with me as needed.  She is also been having a spine and upper back issue after massage that she has been followed by Dr. Addie.  She had questions about this I do not think this is related to her osteoporosis.  Certainly she could see Dr. Addie again or she could see Dr. Georgina.  45 minutes was spent in review of  her chart discussion with her and her husband review of medications and side effects  Follow-Up Instructions: Return if symptoms worsen or fail to improve.   Orders:  No orders of the defined types were placed in this encounter.  No orders of the defined types were placed in this encounter.     Procedures: No procedures performed   Clinical Data: No additional findings.   Subjective: Chief Complaint  Patient presents with   Osteoporosis    HPI pleasant 53 year old woman who is referred for evaluation of osteoporosis.  Referred by Dr. Addie  Review of Systems  All other systems reviewed and are negative.    Objective: Vital Signs: Ht 5' 2.5 (1.588 m)   Wt 143 lb 3.2 oz (65 kg)   LMP 09/02/2018   BMI 25.77 kg/m   Physical Exam Constitutional:       Appearance: Normal appearance.  Pulmonary:     Effort: Pulmonary effort is normal.  Skin:    General: Skin is warm and dry.  Neurological:     General: No focal deficit present.     Mental Status: She is alert and oriented to person, place, and time.  Psychiatric:        Mood and Affect: Mood normal.        Behavior: Behavior normal.       Specialty Comments:  No specialty comments available.  Imaging: No results found.   PMFS History: Patient Active Problem List   Diagnosis Date Noted   Age-related osteoporosis without current pathological fracture 02/05/2024   At high risk for breast cancer 10/26/2023   Genetic testing 09/26/2023   Family history of pancreatic cancer    Family history of colon cancer    Family history of breast cancer    Past Medical History:  Diagnosis Date   Constipation    Endometriosis    Family history of breast cancer    Family history of colon cancer    Family history of pancreatic cancer    GERD (gastroesophageal reflux disease)    no meds - diet controlled   Herpes    History of kidney stones    passed stones - no surgery required   HPV in female    Hyperlipidemia    diet controlled - no meds   Missed ab    no surgery required   Seasonal allergies    SVD (spontaneous vaginal delivery)    x 2    Family History  Problem Relation Age of Onset   Diabetes Father    Early death Father    Heart attack Maternal Uncle    Breast cancer Paternal Aunt 59   Colon cancer Paternal Aunt 76   Pancreatic cancer Paternal Uncle        pancreatic   Heart attack Maternal Grandmother    Heart attack Maternal Grandfather    Diabetes Paternal Grandmother    Colon cancer Paternal Grandmother 86   Colon cancer Paternal Grandfather 60    Past Surgical History:  Procedure Laterality Date   CATARACT EXTRACTION Bilateral    COLONOSCOPY     x 2 -hx polyps   COLPOSCOPY     DILATATION & CURETTAGE/HYSTEROSCOPY WITH MYOSURE N/A 11/04/2016    Procedure: DILATATION & CURETTAGE/HYSTEROSCOPY WITH MYOSURE;  Surgeon: Lavoie, Marie-Lyne, MD;  Location: [facility];  Service: Gynecology;  Laterality: N/A;   RETINAL DETACHMENT SURGERY Left 2000   ROBOTIC ASSISTED LAPAROSCOPIC OVARIAN CYSTECTOMY Left 11/04/2016   Procedure: ROBOTIC ASSISTED LAPAROSCOPIC OVARIAN CYSTECTOMY;  Surgeon: Lavoie, Marie-Lyne, MD;  Location: [facility];  Service: Gynecology;  Laterality: Left;  RNFA confirmed on 10/26/16 with chass   ROBOTIC ASSISTED SALPINGO OOPHERECTOMY Right 11/04/2016   Procedure: ROBOTIC ASSISTED SALPINGO OOPHORECTOMY;  Surgeon: Georgia Blonder, MD;  Location: [facility];  Service: Gynecology;  Laterality: Right;   WISDOM TOOTH EXTRACTION     Social History   Occupational History   Not on file  Tobacco Use   Smoking status: Never   Smokeless tobacco: Never  Vaping Use   Vaping status: Never Used  Substance and Sexual Activity   Alcohol use: Not Currently    Alcohol/week: 1.0 standard drink of alcohol    Types: 1 Glasses of wine per week    Comment: i glass a week of wine   Drug use: No   Sexual activity: Not Currently    Partners: Male    Birth control/protection: Post-menopausal    Comment: 1st intercourse- 22, partners- 2

## 2024-02-19 ENCOUNTER — Ambulatory Visit: Admitting: Physical Medicine and Rehabilitation

## 2024-02-21 ENCOUNTER — Other Ambulatory Visit (HOSPITAL_COMMUNITY): Payer: Self-pay

## 2024-02-22 ENCOUNTER — Ambulatory Visit (HOSPITAL_BASED_OUTPATIENT_CLINIC_OR_DEPARTMENT_OTHER): Attending: Family Medicine | Admitting: Physical Therapy

## 2024-02-22 ENCOUNTER — Other Ambulatory Visit: Payer: Self-pay

## 2024-02-22 DIAGNOSIS — G4733 Obstructive sleep apnea (adult) (pediatric): Secondary | ICD-10-CM | POA: Diagnosis not present

## 2024-02-22 DIAGNOSIS — M6281 Muscle weakness (generalized): Secondary | ICD-10-CM | POA: Insufficient documentation

## 2024-02-22 NOTE — Therapy (Incomplete)
 OUTPATIENT PHYSICAL THERAPY LOWER EXTREMITY EVALUATION   Patient Name: Ashley Marshall MRN: [id number] DOB:Jun 27, 1970, 53 y.o., female Today's Date: 02/22/2024  END OF SESSION:   Past Medical History:  Diagnosis Date   Constipation    Endometriosis    Family history of breast cancer    Family history of colon cancer    Family history of pancreatic cancer    GERD (gastroesophageal reflux disease)    no meds - diet controlled   Herpes    History of kidney stones    passed stones - no surgery required   HPV in female    Hyperlipidemia    diet controlled - no meds   Missed ab    no surgery required   Seasonal allergies    SVD (spontaneous vaginal delivery)    x 2   Past Surgical History:  Procedure Laterality Date   CATARACT EXTRACTION Bilateral    COLONOSCOPY     x 2 -hx polyps   COLPOSCOPY     DILATATION & CURETTAGE/HYSTEROSCOPY WITH MYOSURE N/A 11/04/2016   Procedure: DILATATION & CURETTAGE/HYSTEROSCOPY WITH MYOSURE;  Surgeon: Lavoie, Marie-Lyne, MD;  Location: [facility];  Service: Gynecology;  Laterality: N/A;   RETINAL DETACHMENT SURGERY Left 2000   ROBOTIC ASSISTED LAPAROSCOPIC OVARIAN CYSTECTOMY Left 11/04/2016   Procedure: ROBOTIC ASSISTED LAPAROSCOPIC OVARIAN CYSTECTOMY;  Surgeon: Lavoie, Marie-Lyne, MD;  Location: [facility];  Service: Gynecology;  Laterality: Left;  RNFA confirmed on 10/26/16 with chass   ROBOTIC ASSISTED SALPINGO OOPHERECTOMY Right 11/04/2016   Procedure: ROBOTIC ASSISTED SALPINGO OOPHORECTOMY;  Surgeon: Georgia Blonder, MD;  Location: [facility];  Service: Gynecology;  Laterality: Right;   WISDOM TOOTH EXTRACTION     Patient Active Problem List   Diagnosis Date Noted   Age-related osteoporosis without current pathological fracture 02/05/2024   At high risk for breast cancer 10/26/2023   Genetic testing 09/26/2023   Family history of pancreatic cancer    Family history of colon cancer    Family history of breast cancer     PCP: ***  REFERRING  PROVIDER: Chrystal Lamarr RAMAN, MD  REFERRING DIAG: M81.0 (ICD-10-CM) - Age-related osteoporosis without current pathological fracture  THERAPY DIAG:  No diagnosis found.  Rationale for Evaluation and Treatment: Rehabilitation  ONSET DATE: ***  SUBJECTIVE:   SUBJECTIVE STATEMENT: Pt has endometriosis and had surgery in 2018.  Pt states she went in to menopause early and thinks that may have contributed to osteoporosis.  MD note indicated severe osteoporosis.  PT order indicated T score 3.3 in spine.  Pt had a deep tissue massage in July 2024 and reports increased pain in L sided thoracic area.  She reports having itching and burning in that area. Pt has seen multiple MD's.  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  Pt states it feels better to put pressure  PT in 2023 for R groin pain  She does do some strength training and walking 2-3 times a week at the gym.  Pt walks around track, squats with TrX straps, pulls with TrX straps, biceps curls, leg press, hip abd, hip add, triceps press, elliptical.  Pt hasn't been as much due to husband's injury.  Pt walks for exercise.    Pt states gym exercises can flare up notalgia paresthetica.  --She is also going to look into osteo strong.  Pt states she is fearful of certain movements.  Pt states she is limited in doing too much or lifting with L UE due to notalgia paresthetica.  Pt has difficulty with getting up from the floor.  Pt reports her grip strength is weak and has difficulty opening objects.   PERTINENT HISTORY: Severe osteoporosis notalgia paresthetica Endometriosis having surgery in 2018   PAIN:  Location:  L medial scapula, L sided thoracic  NPRS:  3/10 current, 5/10 worst, 0/10 best  PRECAUTIONS: {Therapy precautions:24002}     WEIGHT BEARING RESTRICTIONS: No  FALLS:  Has patient fallen in last 6 months? 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   PLOF: Independent  PATIENT GOALS:  learn what would help prevent a fracture, improve strength, learn an appropriate exercise program    OBJECTIVE:  Note: Objective measures were completed at Evaluation unless otherwise noted.  DIAGNOSTIC FINDINGS:  MRI in 2023: Alignment: Trace scoliosis. Alignment otherwise normal with preservation of the normal lumbar lordosis. No listhesis.  IMPRESSION: 1. Mild degenerative disc bulge and facet hypertrophy at L4-5 with resultant mild left L4 foraminal stenosis, but no overt neural impingement. 2. Minimal noncompressive disc bulging at L3-4 without stenosis or neural impingement. 3. Otherwise essentially normal MRI of the lumbar spine. No other findings to explain patient's symptoms identified.  PATIENT SURVEYS:  LEFS:  60/80  COGNITION: Overall cognitive status: Within functional limits for tasks assessed     SENSATION: {sensation:27233}  EDEMA:  {edema:24020}  MUSCLE LENGTH: Hamstrings: Right *** deg; Left *** deg Debby test: Right *** deg; Left *** deg  POSTURE: {posture:25561}  PALPATION: Pt had no tenderness with palpation of L medial scapula and L sided thoracic   LOWER EXTREMITY ROM:  {AROM/PROM:27142} ROM Right eval Left eval  Hip flexion    Hip extension    Hip abduction    Hip adduction    Hip internal rotation    Hip external rotation    Knee flexion    Knee extension    Ankle dorsiflexion    Ankle plantarflexion    Ankle inversion    Ankle eversion     (Blank rows = not tested)  LOWER EXTREMITY MMT:  MMT Right eval Left eval  Hip flexion 35.3 34.9  Hip extension    Hip abduction 30.6 ; 4-/5 37.8 ; 4/5  Hip adduction    Hip internal rotation    Hip external rotation    Knee flexion 5/5 5/5  Knee extension 4/5 4/5  Ankle dorsiflexion    Ankle plantarflexion    Ankle inversion    Ankle eversion     (Blank rows = not  tested)   FUNCTIONAL TESTS:  5x STS test:  9.02 sec    GAIT: Assistive device utilized: None Level of assistance: Complete Independence Comments: WFL, pt had no limp  TREATMENT DATE: ***    PATIENT EDUCATION:  Education details: *** Person educated: {Person educated:25204} Education method: {Education Method:25205} Education comprehension: {Education Comprehension:25206}  HOME EXERCISE PROGRAM: ***  ASSESSMENT:  CLINICAL IMPRESSION: Patient is a *** y.o. *** who was seen today for physical therapy evaluation and treatment for ***.   OBJECTIVE IMPAIRMENTS: {opptimpairments:25111}.   ACTIVITY LIMITATIONS: {activitylimitations:27494}  PARTICIPATION LIMITATIONS: {participationrestrictions:25113}  PERSONAL FACTORS: {Personal factors:25162} are also affecting patient's functional outcome.   REHAB POTENTIAL: {rehabpotential:25112}  CLINICAL DECISION MAKING: {clinical decision making:25114}  EVALUATION COMPLEXITY: {Evaluation complexity:25115}   GOALS: Goals reviewed with patient? {yes/no:20286}  SHORT TERM GOALS: Target date: *** *** Baseline: Goal status: INITIAL  2.  *** Baseline:  Goal status: INITIAL  3.  *** Baseline:  Goal status: INITIAL  4.  *** Baseline:  Goal status: INITIAL  5.  *** Baseline:  Goal status: INITIAL  6.  *** Baseline:  Goal status: INITIAL  LONG TERM GOALS: Target date: ***  *** Baseline:  Goal status: INITIAL  2.  *** Baseline:  Goal status: INITIAL  3.  *** Baseline:  Goal status: INITIAL  4.  *** Baseline:  Goal status: INITIAL  5.  *** Baseline:  Goal status: INITIAL  6.  *** Baseline:  Goal status: INITIAL   PLAN:  PT FREQUENCY: 1x/week  PT DURATION: 6 weeks  PLANNED INTERVENTIONS: {rehab planned interventions:25118::97110-Therapeutic exercises,97530-  Therapeutic [phone]- Neuromuscular re-education,97535- Self Rjmz,02859- Manual therapy,Patient/Family education}  PLAN FOR NEXT SESSION: PIERRETTE Mose Minerva, PT 02/22/2024, 12:26 PM

## 2024-02-23 ENCOUNTER — Other Ambulatory Visit: Payer: Self-pay | Admitting: Obstetrics and Gynecology

## 2024-02-23 ENCOUNTER — Other Ambulatory Visit: Payer: Self-pay

## 2024-02-23 ENCOUNTER — Other Ambulatory Visit (HOSPITAL_COMMUNITY): Payer: Self-pay

## 2024-02-23 ENCOUNTER — Encounter (HOSPITAL_BASED_OUTPATIENT_CLINIC_OR_DEPARTMENT_OTHER): Payer: Self-pay | Admitting: Physical Therapy

## 2024-02-23 MED ORDER — JUBBONTI 60 MG/ML ~~LOC~~ SOSY: 60.0000 mg | PREFILLED_SYRINGE | SUBCUTANEOUS | 6 refills | Status: DC

## 2024-02-23 MED ORDER — DENOSUMAB 60 MG/ML ~~LOC~~ SOSY: 60.0000 mg | PREFILLED_SYRINGE | Freq: Once | SUBCUTANEOUS | 0 refills | Status: AC

## 2024-02-23 MED ORDER — DENOSUMAB 60 MG/ML ~~LOC~~ SOSY
60.0000 mg | PREFILLED_SYRINGE | Freq: Once | SUBCUTANEOUS | 0 refills | Status: DC
  Filled 2024-02-23: qty 1, 1d supply, fill #0

## 2024-02-26 ENCOUNTER — Ambulatory Visit: Attending: Family Medicine | Admitting: Pharmacist

## 2024-02-26 ENCOUNTER — Other Ambulatory Visit: Payer: Self-pay

## 2024-02-26 ENCOUNTER — Other Ambulatory Visit: Payer: Self-pay | Admitting: Pharmacist

## 2024-02-26 ENCOUNTER — Telehealth: Payer: Self-pay

## 2024-02-26 DIAGNOSIS — Z7189 Other specified counseling: Secondary | ICD-10-CM

## 2024-02-26 DIAGNOSIS — F411 Generalized anxiety disorder: Secondary | ICD-10-CM | POA: Diagnosis not present

## 2024-02-26 MED ORDER — DENOSUMAB 60 MG/ML ~~LOC~~ SOSY
60.0000 mg | PREFILLED_SYRINGE | Freq: Once | SUBCUTANEOUS | 0 refills | Status: AC
  Filled 2024-02-26: qty 1, 1d supply, fill #0
  Filled 2024-02-28: qty 1, 28d supply, fill #0

## 2024-02-26 NOTE — Progress Notes (Signed)
 Pharmacy Patient Advocate Encounter  Insurance verification completed.   The patient is insured through [facility]   Ran test claim for Prolia. PA required.   This test claim was processed through [facility]- copay amounts may vary at other pharmacies due to pharmacy/plan contracts, or as the patient moves through the different stages of their insurance plan.

## 2024-02-26 NOTE — Progress Notes (Signed)
 S:  Patient presents for review of their specialty medication therapy.  Patient is about to start taking Prolia for osteoporosis. Patient is managed by Dr. Glennon for this.   Adherence: has not started therapy   Efficacy: has not started therapy   Dosing:   Dose adjustments: Renal: Monitor patients with severe impairment (CrCl <30 mL/minute or on dialysis) closely, as significant and prolonged hypocalcemia (incidence of 29% and potentially lasting weeks to months) and marked elevations of serum parathyroid hormone are serious risks in this population. Ensure adequate calcium and vitamin D intake/supplementation. CrCl >=30 mL/minute: No dosage adjustment necessary. CrCl <30 mL/minute: No dosage adjustment necessary; use in conjunction with guidance from patient's nephrology team. Hepatic: no dose adjustments (has not been studied)  Drug-drug interactions: none identified   Monitoring: S/sx of infection: has not started therapy  S/sx of hypersensitivity: has not started therapy  S/sx of hypocalcemia/hypercalcemia: has not started therapy  Dermatitis/skin rash: has not started therapy  Peripheral edema: has not started therapy   HA: has not started therapy  GI upset: has not started therapy   Other side effects: has not started therapy   Last bone density study: 12/26/2023  O:      Lab Results  Component Value Date   WBC 12.1 (H) 11/04/2016   HGB 12.4 11/04/2016   HCT 35.5 (L) 11/04/2016   MCV 94.9 11/04/2016   PLT 203 11/04/2016      Chemistry   No results found for: NA, K, CL, CO2, BUN, CREATININE, GLU No results found for: CALCIUM, ALKPHOS, AST, ALT, BILITOT     A/P: 1. Medication review: Patient currently prescribed Prolia for osteoporosis. Reviewed the medication with the patient, including the following: Prolia (denosumab) is a monoclonal antibody with affinity for nuclear factor-kappa ligand (RANKL). Prolia binds to RANKL and prevents  osteoclast formation, leading to decreased bone resorption and increased bone mass in osteoporosis. Patient educated on purpose, proper use, and potential adverse effects of Prolia. The most common adverse effects are hypersensitivities, peripheral edema, dermatitis/skin rash, GI upset, HA, joint pain, and infection. There is the possibility of atypical femur fracture, serum calcium disturbances, and osteonecrosis of the jaw. Patients should monitor for and report hip, thigh, or groin pain. Additionally, patients should monitor for and report jaw pain, tooth/periodontal infection, toothache, and/or gingival ulceration/erosion. Prolia exists as a solution prefilled syringe for SQ administration. Administration: Denosumab is intended for SubQ route only and should not be administered IV, IM, or intradermally. Prior to administration, bring to room temperature in original container (allow to stand ~15 to 30 minutes); do not warm by any other method. Solution may contain trace amounts of translucent to white protein particles; do not use if cloudy, discolored (normal solution should be clear and colorless to pale yellow), or contains excessive particles or foreign matter. Avoid vigorous shaking. Administer via SubQ injection in the upper arm, upper thigh, or abdomen; should only be administered by a health care professional. No recommendations for any changes at this time.   Herlene Fleeta Morris, PharmD, JAQUELINE, CPP Clinical Pharmacist [facility] [phone]

## 2024-02-26 NOTE — Telephone Encounter (Signed)
 Pharmacy Patient Advocate Encounter  Received notification from [facility] that Prior Authorization for Prolia has been APPROVED from 02/26/24 to 02/24/25   PA #/Case ID/Reference #: [id number]

## 2024-02-26 NOTE — Addendum Note (Signed)
 Addended by: FLEETA MORRIS, GARNETTE L on: 02/26/2024 11:29 AM   Modules accepted: Orders

## 2024-02-26 NOTE — Telephone Encounter (Signed)
 Pharmacy Patient Advocate Encounter   Received notification from Patient Pharmacy that prior authorization for Prolia is required/requested.   Insurance verification completed.   The patient is insured through [facility].   Per test claim: PA required; PA submitted to above mentioned insurance via Latent Key/confirmation #/EOC [employer] Status is pending

## 2024-02-26 NOTE — Progress Notes (Signed)
 See OV from 02/26/24 for complete documentation .   Ashley Marshall, PharmD, JAQUELINE, CPP Clinical Pharmacist [facility] [phone]

## 2024-02-27 ENCOUNTER — Other Ambulatory Visit: Payer: Self-pay

## 2024-02-27 NOTE — Progress Notes (Signed)
 Rx discontinued for Jubbonti -spoke with Education officer, community at [employer].

## 2024-02-27 NOTE — Progress Notes (Signed)
 PA approved. Copay $0

## 2024-02-27 NOTE — Telephone Encounter (Signed)
 Spoke with patient. Explained updates to Prolia and ordering process through [facility], questions answered. Patient was contacted by [facility] on 02/26/24.   Prolia benefits reviewed by [facility], PA approved -see 02/26/24 encounters.   Advised patient to contact [facility] pharmacy back to set up shipping to office, once received in office can schedule nurse visit for injection. Advised I will call to cancel Jubontti Rx at [facility] and update Damien and Dr. Glennon. Patient appreciative of call. Patient aware to call if any questions.   Call placed to [place], spoke with Brittney, Jubontti Rx cancelled.   Routing FYI.

## 2024-02-27 NOTE — Addendum Note (Signed)
 Addended by: BRUTUS KATE SAILOR on: 02/27/2024 11:40 AM   Modules accepted: Orders

## 2024-02-28 ENCOUNTER — Other Ambulatory Visit: Payer: Self-pay

## 2024-02-28 NOTE — Progress Notes (Signed)
 Specialty Pharmacy Initial Fill Coordination Note  Ashley Marshall is a 53 y.o. female contacted today regarding initial fill of specialty medication(s) Denosumab (PROLIA)   Patient requested Courier to Provider Office   Delivery date: 02/29/24   Verified address: [facility] of [address] [phone]   Medication will be filled on 10/15.   Patient is aware of $0 copayment.

## 2024-03-01 NOTE — Telephone Encounter (Signed)
 Prolia received in office.   Per review of EPIC labs and [facility], no recent labs on file.    Cc: Damien

## 2024-03-01 NOTE — Telephone Encounter (Signed)
 Spoke with patient, advised Prolia has been delivered to office.   Patient states she had labs with PCP on 01/05/24. Patient is out of town, requesting request be initiated. Fax request sent and confirmed.  Patient will f/u with PCP on Monday for labs.   Once labs received can proceed with Prolia. Patient appreciative of call.   Routing to [place].

## 2024-03-04 NOTE — Telephone Encounter (Signed)
 Spoke with Orlean at Dr. Linette office, labs requested were faxed this afternoon.

## 2024-03-05 ENCOUNTER — Other Ambulatory Visit: Payer: Self-pay

## 2024-03-05 DIAGNOSIS — E559 Vitamin D deficiency, unspecified: Secondary | ICD-10-CM | POA: Diagnosis not present

## 2024-03-05 DIAGNOSIS — E7800 Pure hypercholesterolemia, unspecified: Secondary | ICD-10-CM | POA: Diagnosis not present

## 2024-03-05 DIAGNOSIS — K2100 Gastro-esophageal reflux disease with esophagitis, without bleeding: Secondary | ICD-10-CM | POA: Diagnosis not present

## 2024-03-05 DIAGNOSIS — M818 Other osteoporosis without current pathological fracture: Secondary | ICD-10-CM | POA: Diagnosis not present

## 2024-03-06 ENCOUNTER — Other Ambulatory Visit: Payer: Self-pay

## 2024-03-06 ENCOUNTER — Other Ambulatory Visit: Payer: Self-pay | Admitting: *Deleted

## 2024-03-06 ENCOUNTER — Telehealth: Payer: Self-pay

## 2024-03-06 MED ORDER — DENOSUMAB 60 MG/ML ~~LOC~~ SOSY: 60.0000 mg | PREFILLED_SYRINGE | Freq: Once | SUBCUTANEOUS | Status: DC

## 2024-03-06 MED ORDER — TERIPARATIDE 560 MCG/2.24ML ~~LOC~~ SOPN: PEN_INJECTOR | SUBCUTANEOUS | 5 refills | Status: DC

## 2024-03-06 NOTE — Progress Notes (Signed)
 Patient wants to try Forteo instead of Prolia. PA required.

## 2024-03-06 NOTE — Telephone Encounter (Signed)
 Pharmacy Patient Advocate Encounter   Received notification from Patient Pharmacy that prior authorization for Forteo is required/requested.   Insurance verification completed.   The patient is insured through [facility].   Per test claim: PA required; PA submitted to above mentioned insurance via Latent Key/confirmation #/EOC [id number] Status is pending

## 2024-03-07 ENCOUNTER — Other Ambulatory Visit: Payer: Self-pay | Admitting: Pharmacist

## 2024-03-07 ENCOUNTER — Ambulatory Visit: Attending: Family Medicine | Admitting: Pharmacist

## 2024-03-07 ENCOUNTER — Other Ambulatory Visit: Payer: Self-pay

## 2024-03-07 DIAGNOSIS — G4734 Idiopathic sleep related nonobstructive alveolar hypoventilation: Secondary | ICD-10-CM | POA: Diagnosis not present

## 2024-03-07 DIAGNOSIS — Z7189 Other specified counseling: Secondary | ICD-10-CM

## 2024-03-07 DIAGNOSIS — M810 Age-related osteoporosis without current pathological fracture: Secondary | ICD-10-CM

## 2024-03-07 DIAGNOSIS — G4733 Obstructive sleep apnea (adult) (pediatric): Secondary | ICD-10-CM | POA: Diagnosis not present

## 2024-03-07 NOTE — Telephone Encounter (Signed)
 Pharmacy Patient Advocate Encounter  Received notification from [facility] that Prior Authorization for Forteo has been APPROVED from 03/06/24 to 02/24/26   PA #/Case ID/Reference #: [id number]

## 2024-03-07 NOTE — Progress Notes (Signed)
 S: Patient presents today for review of their specialty medication.    Patient is currently prescribed Forteo for osteoporosis. Patient is managed by Dr. Braulio for this.    Dosing:  -20 mcg daily    Adherence: has not yet started.    Efficacy: has not yet started   Monitoring: has not yet started. Pertinent monitoring parameters include:  - Calcium levels periodically - BP/symptoms of orthostatic hypotension - BMD for efficacy      Current adverse effects: has not yet started.  - Most common side effects: hypercalcemia, GI side effects (nausea) - Uncommon side effects: orthostatic hypotension, antibody development, dizziness, arthralgia, dyspnea, or sx of pharyngitis - Rare/post-marketing: osteosarcoma (increased risk HAS NOT been seen in observational studies), urolithiasis, cutaneous calcification, injection site reactions.    O:     Lab Results  Component Value Date   WBC 12.1 (H) 11/04/2016   HGB 12.4 11/04/2016   HCT 35.5 (L) 11/04/2016   MCV 94.9 11/04/2016   PLT 203 11/04/2016      Chemistry   No results found for: NA, K, CL, CO2, BUN, CREATININE, GLU No results found for: CALCIUM, ALKPHOS, AST, ALT, BILITOT     A/P: 1. Medication review: patient currently prescribed Forteo for osteoporosis. Covered the medication, indication, dosing, possible adverse effects, and monitoring. See above. No recommendations for any changes at this time.   Herlene Fleeta Morris, PharmD, JAQUELINE, CPP Clinical Pharmacist [facility] [phone]

## 2024-03-07 NOTE — Progress Notes (Signed)
 See OV from 03/07/24 for complete documentation.   Ashley Marshall, PharmD, JAQUELINE, CPP Clinical Pharmacist [facility] [phone]

## 2024-03-07 NOTE — Progress Notes (Signed)
 Forteo PA approved. Copay is $0.

## 2024-03-08 ENCOUNTER — Other Ambulatory Visit: Payer: Self-pay

## 2024-03-11 ENCOUNTER — Other Ambulatory Visit: Payer: Self-pay

## 2024-03-11 DIAGNOSIS — F411 Generalized anxiety disorder: Secondary | ICD-10-CM | POA: Diagnosis not present

## 2024-03-12 ENCOUNTER — Other Ambulatory Visit: Payer: Self-pay

## 2024-03-14 ENCOUNTER — Other Ambulatory Visit (HOSPITAL_COMMUNITY): Payer: Self-pay

## 2024-03-14 ENCOUNTER — Other Ambulatory Visit: Payer: Self-pay

## 2024-03-15 ENCOUNTER — Encounter (HOSPITAL_BASED_OUTPATIENT_CLINIC_OR_DEPARTMENT_OTHER): Payer: Self-pay | Admitting: Physical Therapy

## 2024-03-15 ENCOUNTER — Ambulatory Visit (HOSPITAL_BASED_OUTPATIENT_CLINIC_OR_DEPARTMENT_OTHER): Admitting: Physical Therapy

## 2024-03-15 ENCOUNTER — Other Ambulatory Visit: Payer: Self-pay

## 2024-03-15 DIAGNOSIS — M6281 Muscle weakness (generalized): Secondary | ICD-10-CM | POA: Diagnosis not present

## 2024-03-15 NOTE — Therapy (Signed)
 OUTPATIENT PHYSICAL THERAPY LOWER EXTREMITY TREATMENT    Patient Name: Ashley Marshall MRN: [id number] DOB:Jan 07, 1971, 53 y.o., female Today's Date: 03/15/2024  END OF SESSION:  PT End of Session - 03/15/24 1432     Visit Number 2    Number of Visits 6    Date for Recertification  04/04/24    Authorization Type [place] [employer]    PT Start Time 1350    PT Stop Time 1430    PT Time Calculation (min) 40 min    Activity Tolerance Patient tolerated treatment well    Behavior During Therapy WFL for tasks assessed/performed           Past Medical History:  Diagnosis Date   Constipation    Endometriosis    Family history of breast cancer    Family history of colon cancer    Family history of pancreatic cancer    GERD (gastroesophageal reflux disease)    no meds - diet controlled   Herpes    History of kidney stones    passed stones - no surgery required   HPV in female    Hyperlipidemia    diet controlled - no meds   Missed ab    no surgery required   Seasonal allergies    SVD (spontaneous vaginal delivery)    x 2   Past Surgical History:  Procedure Laterality Date   CATARACT EXTRACTION Bilateral    COLONOSCOPY     x 2 -hx polyps   COLPOSCOPY     DILATATION & CURETTAGE/HYSTEROSCOPY WITH MYOSURE N/A 11/04/2016   Procedure: DILATATION & CURETTAGE/HYSTEROSCOPY WITH MYOSURE;  Surgeon: Lavoie, Marie-Lyne, MD;  Location: [facility];  Service: Gynecology;  Laterality: N/A;   RETINAL DETACHMENT SURGERY Left 2000   ROBOTIC ASSISTED LAPAROSCOPIC OVARIAN CYSTECTOMY Left 11/04/2016   Procedure: ROBOTIC ASSISTED LAPAROSCOPIC OVARIAN CYSTECTOMY;  Surgeon: Lavoie, Marie-Lyne, MD;  Location: [facility];  Service: Gynecology;  Laterality: Left;  RNFA confirmed on 10/26/16 with chass   ROBOTIC ASSISTED SALPINGO OOPHERECTOMY Right 11/04/2016   Procedure: ROBOTIC ASSISTED SALPINGO OOPHORECTOMY;  Surgeon: Georgia Blonder, MD;  Location: [facility];  Service: Gynecology;  Laterality: Right;    WISDOM TOOTH EXTRACTION     Patient Active Problem List   Diagnosis Date Noted   Age-related osteoporosis without current pathological fracture 02/05/2024   At high risk for breast cancer 10/26/2023   Genetic testing 09/26/2023   Family history of pancreatic cancer    Family history of colon cancer    Family history of breast cancer      REFERRING PROVIDER: Chrystal Lamarr RAMAN, MD  REFERRING DIAG: M81.0 (ICD-10-CM) - Age-related osteoporosis without current pathological fracture  THERAPY DIAG:  Muscle weakness (generalized)  Rationale for Evaluation and Treatment: Rehabilitation  ONSET DATE: PT order 01/09/24  SUBJECTIVE:   SUBJECTIVE STATEMENT:   I've not started on new meds just yet, will need to decide on one. Have increased my calcium and wanted to do PT. Some of the things that I do might not be the best, want to update some of my exercise program, example walking instead of TM. In terms of notalgia paresthetica, lifting lifts or lifting arm above head I notice sx.      EVAL: Pt has endometriosis and had surgery in 2018.  Pt states she went in to menopause early and thinks that may have contributed to osteoporosis.  MD note indicated severe osteoporosis.  PT order indicated T score 3.3 in spine.  Pt had a deep  tissue massage in July 2024 and reports increased pain in L sided thoracic area.  She reports having itching and burning in that area. Pt has seen multiple MD's.  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  Pt states it feels better to put pressure  She does some strength training and walking 2-3 times a week at the gym.  Pt hasn't been as much due to husband's injury.  She walks for exercise.  Pt states gym exercises can flare up notalgia paresthetica and she is limited with lifting or amount of activities due to notalgia paresthetica.  She is thinking about looking into osteo-strong.  Pt states she is fearful of certain movements.  Pt has difficulty with  getting up from the floor.  Pt reports her grip strength is weak and has difficulty opening objects.   Pt had PT in 2023 for R groin pain.  PERTINENT HISTORY: Severe osteoporosis notalgia paresthetica--abnormal sensation in L sided thoracic area Endometriosis and had surgery in 2018   PAIN:  Location:  lower back  NPRS:  4/10 Tightness Being in one position for too long, movement makes it better   PRECAUTIONS: Other: per dx     WEIGHT BEARING RESTRICTIONS: No  FALLS:  Has patient fallen in last 6 months? 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   PLOF: Independent  PATIENT GOALS:  learn what would help prevent a fracture, improve strength, learn an appropriate exercise program    OBJECTIVE:  Note: Objective measures were completed at Evaluation unless otherwise noted.  DIAGNOSTIC FINDINGS:  MRI in 2023: Alignment: Trace scoliosis. Alignment otherwise normal with preservation of the normal lumbar lordosis. No listhesis.  IMPRESSION: 1. Mild degenerative disc bulge and facet hypertrophy at L4-5 with resultant mild left L4 foraminal stenosis, but no overt neural impingement. 2. Minimal noncompressive disc bulging at L3-4 without stenosis or neural impingement. 3. Otherwise essentially normal MRI of the lumbar spine. No other findings to explain patient's symptoms identified.  PATIENT SURVEYS:  LEFS:  60/80  COGNITION: Overall cognitive status: Within functional limits for tasks assessed      PALPATION: Pt had no tenderness with palpation of L medial scapula and L sided thoracic   LOWER EXTREMITY ROM:  Active ROM Right eval Left eval  Hip flexion    Hip extension    Hip abduction [facility]  Hip adduction    Hip internal rotation    Hip external rotation    Knee flexion    Knee extension [facility]  Ankle dorsiflexion    Ankle  plantarflexion    Ankle inversion    Ankle eversion     (Blank rows = not tested)  LOWER EXTREMITY MMT:  MMT Right eval Left eval  Hip flexion 35.3 34.9  Hip extension    Hip abduction 30.6 ; 4-/5 37.8 ; 4/5  Hip adduction    Hip internal rotation    Hip external rotation    Knee flexion 5/5 5/5  Knee extension 4/5 4/5  Ankle dorsiflexion    Ankle plantarflexion    Ankle inversion    Ankle eversion     (Blank rows = not tested)   FUNCTIONAL TESTS:  5x STS test:  9.02 sec    GAIT: Assistive device utilized: None Level of assistance: Complete Independence Comments: WFL, pt had no limp  TREATMENT:     03/15/24  Education on which machines in the gym would be good to use- form, reps, machine set up for lat pulls, rows with cables and on static machines, tricep press static machine and with cables, discussed hip machines- discouraged crunch machine and hip ABD machine/encouraged leg press; demonstrated and tried UBE and discussed using under desk bike at home to replicate this for postural mm endurance, discussed core strength, discussed mechanism of how body puts    Practiced 3 way reaches yellow TB, hip ABD yellow TB, hip hikes, tandem stance, tandem walks   PATIENT EDUCATION:  Education details: Dx, importance of Wb'ing activities/exercises, movements to avoid, prognosis, rationale of interventions, POC, objective findings, and relevant anatomy.  PT answered pt's questions.  Person educated: Patient Education method: Medical Illustrator Education comprehension: verbalized understanding  HOME EXERCISE PROGRAM:  Access Code: [email or web address] URL: [email or web address] Date: 03/15/2024 Prepared by: Josette Rough  Exercises - Standing Plank on Wall with Reaches and Resistance  - 1 x daily - 7 x weekly - 3 sets - 10 reps -  Standing Hip Hiking  - 1 x daily - 7 x weekly - 3 sets - 10 reps - Hip Abduction with Resistance Loop  - 1 x daily - 7 x weekly - 3 sets - 10 reps - Tandem Stance in Corner  - 1 x daily - 7 x weekly - 3 sets - 10 reps - Tandem Walking with Counter Support  - 1 x daily - 7 x weekly - 3 sets - 10 reps    ASSESSMENT:  CLINICAL IMPRESSION:  Focused session on reviewing gym machines, general exercises, exercise parameters that will be helpful in addressing her bone density concerns and fracture prevention. Education as above. We can work on reviewing these and progressing exercises at next visit.    EVAL: Patient is a 53 y.o. female with a dx of osteoporosis which MD notes indicated being severe osteoporosis.  Pt performs UE and LE exercises at the gym and walks for exercises.  Pt has notalgia paresthetica which causes pain and abnormal sensations in L sided thoracic areas. This does impact her workouts.  Pt states she is fearful of certain movements.  She has difficulty with getting up from the floor.  Pt reports her grip strength is weak and has difficulty opening objects.  Pt demonstrated bilat LE weakness.  She wants to learn an appropriate exercise program, improve strength, and learn strategies to help prevent fractures.  Pt should benefit from skilled PT to address impairments and improve overall function.      OBJECTIVE IMPAIRMENTS: decreased strength and pain.   ACTIVITY LIMITATIONS: lifting, bending, and transfers  PARTICIPATION LIMITATIONS:   PERSONAL FACTORS: 1 comorbidity: notalgia paresthetica are also affecting patient's functional outcome.   REHAB POTENTIAL: Good  CLINICAL DECISION MAKING: Stable/uncomplicated  EVALUATION COMPLEXITY: Low   GOALS:   SHORT TERM GOALS: Target date: 03/21/2024  Pt will be independent and compliant with HEP for improved strength, pain, and postural stabilization. Baseline: Goal status: INITIAL    LONG TERM GOALS: Target date:   04/04/2024   Pt will demo improved bilat knee extension and hip abduction strength to 5/5 MMT for improved strength in LE's for performance of functional mobility.   Baseline:  Goal status: INITIAL  2.  Pt will demo good understanding of gym exercises and home program for appropriate loading to promote bone health and strength.  Baseline:  Goal status: INITIAL  3.  Pt will report  improved confidence with daily mobility and daily activities.  Baseline:  Goal status: INITIAL    PLAN:  PT FREQUENCY: 1x/week  PT DURATION: 6 weeks  PLANNED INTERVENTIONS: [id number]- PT Re-evaluation, 97750- Physical Performance Testing, 97110-Therapeutic exercises, 97530- Therapeutic activity, [id number]- Neuromuscular re-education, 97535- Self Care, [id number]- Manual therapy, [id number]- Gait training, [phone]- Aquatic Therapy, [phone]- Electrical stimulation (unattended), [phone]- Electrical stimulation (manual), [id number]- Ultrasound, [id number] (1-2 muscles), [id number] (3+ muscles)- Dry Needling, Patient/Family education, Stair training, Taping, Cryotherapy, and Moist heat  PLAN FOR NEXT SESSION: Review current gym program.  Establish HEP.  Cont with Wb'ing exercises.  Cont with strengthening and postural stabilization.  Be aware of notalgia paresthetica affecting her L sided thoracic with exercises.       Leigh Minerva III PT, DPT 03/15/24 2:33 PM

## 2024-03-18 ENCOUNTER — Other Ambulatory Visit: Payer: Self-pay

## 2024-03-18 ENCOUNTER — Encounter: Payer: Self-pay | Admitting: Radiology

## 2024-03-19 ENCOUNTER — Encounter (HOSPITAL_BASED_OUTPATIENT_CLINIC_OR_DEPARTMENT_OTHER): Payer: Self-pay | Admitting: Physical Therapy

## 2024-03-19 ENCOUNTER — Ambulatory Visit (HOSPITAL_BASED_OUTPATIENT_CLINIC_OR_DEPARTMENT_OTHER): Attending: Family Medicine | Admitting: Physical Therapy

## 2024-03-19 ENCOUNTER — Other Ambulatory Visit: Payer: Self-pay

## 2024-03-19 DIAGNOSIS — M6281 Muscle weakness (generalized): Secondary | ICD-10-CM | POA: Insufficient documentation

## 2024-03-19 NOTE — Therapy (Signed)
 OUTPATIENT PHYSICAL THERAPY LOWER EXTREMITY TREATMENT    Patient Name: Ashley Marshall MRN: [id number] DOB:May 24, 1970, 53 y.o., female Today's Date: 03/19/2024  END OF SESSION:  PT End of Session - 03/19/24 1705     Visit Number 3    Number of Visits 6    Date for Recertification  04/04/24    Authorization Type Jolynn Davene LEYDEN    PT Start Time 1700    PT Stop Time 1745    PT Time Calculation (min) 45 min    Activity Tolerance Patient tolerated treatment well    Behavior During Therapy WFL for tasks assessed/performed           Past Medical History:  Diagnosis Date   Constipation    Endometriosis    Family history of breast cancer    Family history of colon cancer    Family history of pancreatic cancer    GERD (gastroesophageal reflux disease)    no meds - diet controlled   Herpes    History of kidney stones    passed stones - no surgery required   HPV in female    Hyperlipidemia    diet controlled - no meds   Missed ab    no surgery required   Seasonal allergies    SVD (spontaneous vaginal delivery)    x 2   Past Surgical History:  Procedure Laterality Date   CATARACT EXTRACTION Bilateral    COLONOSCOPY     x 2 -hx polyps   COLPOSCOPY     DILATATION & CURETTAGE/HYSTEROSCOPY WITH MYOSURE N/A 11/04/2016   Procedure: DILATATION & CURETTAGE/HYSTEROSCOPY WITH MYOSURE;  Surgeon: Lavoie, Marie-Lyne, MD;  Location: [facility];  Service: Gynecology;  Laterality: N/A;   RETINAL DETACHMENT SURGERY Left 2000   ROBOTIC ASSISTED LAPAROSCOPIC OVARIAN CYSTECTOMY Left 11/04/2016   Procedure: ROBOTIC ASSISTED LAPAROSCOPIC OVARIAN CYSTECTOMY;  Surgeon: Lavoie, Marie-Lyne, MD;  Location: [facility];  Service: Gynecology;  Laterality: Left;  RNFA confirmed on 10/26/16 with chass   ROBOTIC ASSISTED SALPINGO OOPHERECTOMY Right 11/04/2016   Procedure: ROBOTIC ASSISTED SALPINGO OOPHORECTOMY;  Surgeon: Georgia Blonder, MD;  Location: [facility];  Service: Gynecology;  Laterality: Right;    WISDOM TOOTH EXTRACTION     Patient Active Problem List   Diagnosis Date Noted   Age-related osteoporosis without current pathological fracture 02/05/2024   At high risk for breast cancer 10/26/2023   Genetic testing 09/26/2023   Family history of pancreatic cancer    Family history of colon cancer    Family history of breast cancer      REFERRING PROVIDER: Chrystal Lamarr RAMAN, MD  REFERRING DIAG: M81.0 (ICD-10-CM) - Age-related osteoporosis without current pathological fracture  THERAPY DIAG:  Muscle weakness (generalized)  Rationale for Evaluation and Treatment: Rehabilitation  ONSET DATE: PT order 01/09/24  SUBJECTIVE:   SUBJECTIVE STATEMENT: Pt reports no new changes since last visit.      EVAL: Pt has endometriosis and had surgery in 2018.  Pt states she went in to menopause early and thinks that may have contributed to osteoporosis.  MD note indicated severe osteoporosis.  PT order indicated T score 3.3 in spine.  Pt had a deep tissue massage in July 2024 and reports increased pain in L sided thoracic area.  She reports having itching and burning in that area. Pt has seen multiple MD's.  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  Pt states it feels better to put pressure  She does some strength training and walking 2-3 times  a week at the gym.  Pt hasn't been as much due to husband's injury.  She walks for exercise.  Pt states gym exercises can flare up notalgia paresthetica and she is limited with lifting or amount of activities due to notalgia paresthetica.  She is thinking about looking into osteo-strong.  Pt states she is fearful of certain movements.  Pt has difficulty with getting up from the floor.  Pt reports her grip strength is weak and has difficulty opening objects.   Pt had PT in 2023 for R groin pain.  PERTINENT HISTORY: Severe osteoporosis notalgia paresthetica--abnormal sensation in L sided thoracic area Endometriosis and had surgery in  2018   PAIN:  Location:  lower back  NPRS:  4/10 Description: Tightness Being in one position for too long, movement makes it better   PRECAUTIONS: Other: per dx     WEIGHT BEARING RESTRICTIONS: No  FALLS:  Has patient fallen in last 6 months? 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   PLOF: Independent  PATIENT GOALS:  learn what would help prevent a fracture, improve strength, learn an appropriate exercise program    OBJECTIVE:  Note: Objective measures were completed at Evaluation unless otherwise noted.  DIAGNOSTIC FINDINGS:  MRI in 2023: Alignment: Trace scoliosis. Alignment otherwise normal with preservation of the normal lumbar lordosis. No listhesis.  IMPRESSION: 1. Mild degenerative disc bulge and facet hypertrophy at L4-5 with resultant mild left L4 foraminal stenosis, but no overt neural impingement. 2. Minimal noncompressive disc bulging at L3-4 without stenosis or neural impingement. 3. Otherwise essentially normal MRI of the lumbar spine. No other findings to explain patient's symptoms identified.  PATIENT SURVEYS:  LEFS:  60/80  COGNITION: Overall cognitive status: Within functional limits for tasks assessed      PALPATION: Pt had no tenderness with palpation of L medial scapula and L sided thoracic   LOWER EXTREMITY ROM:  Active ROM Right eval Left eval  Hip flexion    Hip extension    Hip abduction [facility]  Hip adduction    Hip internal rotation    Hip external rotation    Knee flexion    Knee extension [facility]  Ankle dorsiflexion    Ankle plantarflexion    Ankle inversion    Ankle eversion     (Blank rows = not tested)  LOWER EXTREMITY MMT:  MMT Right eval Left eval  Hip flexion 35.3 34.9  Hip extension    Hip abduction 30.6 ; 4-/5 37.8 ; 4/5  Hip adduction    Hip internal rotation    Hip external  rotation    Knee flexion 5/5 5/5  Knee extension 4/5 4/5  Ankle dorsiflexion    Ankle plantarflexion    Ankle inversion    Ankle eversion     (Blank rows = not tested)   FUNCTIONAL TESTS:  5x STS test:  9.02 sec    GAIT: Assistive device utilized: None Level of assistance: Complete Independence Comments: WFL, pt had no limp  TREATMENT:   03/19/24: -Reviewed current gym routine - decompression position: red band 2 x 10 each:  D2 flexion, horz abdct, bil flexion (touch down), and bil ER  - prone opposite arm /leg lift x 5 each side, head in neutral position - reviewed form with her current exercises:  push up from rail at track x 5 (cues to not round back), TRX row x 5 (cues to keep shoulders depressed), STS with medicine ball push out x 3 -HEP updated and issued, with long red band  03/15/24  Education on which machines in the gym would be good to use- form, reps, machine set up for lat pulls, rows with cables and on static machines, tricep press static machine and with cables, discussed hip machines- discouraged crunch machine and hip ABD machine/encouraged leg press; demonstrated and tried UBE and discussed using under desk bike at home to replicate this for postural mm endurance, discussed core strength, discussed mechanism of how body puts    Practiced 3 way reaches yellow TB, hip ABD yellow TB, hip hikes, tandem stance, tandem walks   PATIENT EDUCATION:  Education details: Dx, importance of Wb'ing activities/exercises, movements to avoid, prognosis, rationale of interventions, POC, objective findings, and relevant anatomy.  PT answered pt's questions.  Person educated: Patient Education method: Medical Illustrator Education comprehension: verbalized understanding  HOME EXERCISE PROGRAM:  Access Code: [email or web address] URL:  [email or web address] Date: 03/15/2024 Prepared by: Josette Rough  Exercises - Standing Plank on Wall with Reaches and Resistance  - 1 x daily - 7 x weekly - 3 sets - 10 reps - Standing Hip Hiking  - 1 x daily - 7 x weekly - 3 sets - 10 reps - Hip Abduction with Resistance Loop  - 1 x daily - 7 x weekly - 3 sets - 10 reps - Tandem Stance in Corner  - 1 x daily - 7 x weekly - 3 sets - 10 reps - Tandem Walking with Counter Support  - 1 x daily - 7 x weekly - 3 sets - 10 reps    ASSESSMENT:  CLINICAL IMPRESSION:  Focused session on additional resistance exercises for home with red band in decompression position, as well as reviewing form with her current gym exercises. Encouraged lumbar support for work and car.  Good tolerance for all exercises without increase in symptoms during exercise, but pt reported some tingling in area of notalgia paresthetica upon completion of session.  Pt to return in 3 wks, per pt request,  with feedback/questions regarding HEP.  Goals are ongoing.    EVAL: Patient is a 53 y.o. female with a dx of osteoporosis which MD notes indicated being severe osteoporosis.  Pt performs UE and LE exercises at the gym and walks for exercises.  Pt has notalgia paresthetica which causes pain and abnormal sensations in L sided thoracic areas. This does impact her workouts.  Pt states she is fearful of certain movements.  She has difficulty with getting up from the floor.  Pt reports her grip strength is weak and has difficulty opening objects.  Pt demonstrated bilat LE weakness.  She wants to learn an appropriate exercise program, improve strength, and learn strategies to help prevent fractures.  Pt should benefit from skilled PT to address impairments and improve overall function.      OBJECTIVE IMPAIRMENTS: decreased strength and pain.   ACTIVITY LIMITATIONS: lifting, bending, and transfers  PARTICIPATION LIMITATIONS:   PERSONAL FACTORS: 1 comorbidity: notalgia  paresthetica are also affecting patient's functional  outcome.   REHAB POTENTIAL: Good  CLINICAL DECISION MAKING: Stable/uncomplicated  EVALUATION COMPLEXITY: Low   GOALS:   SHORT TERM GOALS: Target date: 03/21/2024  Pt will be independent and compliant with HEP for improved strength, pain, and postural stabilization. Baseline: Goal status: INITIAL    LONG TERM GOALS: Target date:  04/04/2024   Pt will demo improved bilat knee extension and hip abduction strength to 5/5 MMT for improved strength in LE's for performance of functional mobility.   Baseline:  Goal status: INITIAL  2.  Pt will demo good understanding of gym exercises and home program for appropriate loading to promote bone health and strength.  Baseline:  Goal status: INITIAL  3.  Pt will report improved confidence with daily mobility and daily activities.  Baseline:  Goal status: INITIAL    PLAN:  PT FREQUENCY: 1x/week  PT DURATION: 6 weeks  PLANNED INTERVENTIONS: 97164- PT Re-evaluation, 97750- Physical Performance Testing, 97110-Therapeutic exercises, 97530- Therapeutic activity, [id number]- Neuromuscular re-education, 97535- Self Care, 02859- Manual therapy, [id number]- Gait training, [phone]- Aquatic Therapy, [phone]- Electrical stimulation (unattended), [phone]- Electrical stimulation (manual), [id number]- Ultrasound, 79439 (1-2 muscles), 20561 (3+ muscles)- Dry Needling, Patient/Family education, Stair training, Taping, Cryotherapy, and Moist heat  PLAN FOR NEXT SESSION: Review current gym program.  Establish HEP.  Cont with Wb'ing exercises.  Cont with strengthening and postural stabilization.  Be aware of notalgia paresthetica affecting her L sided thoracic with exercises.      Delon Aquas, PTA 03/19/24 6:00 PM [facility] [address] [place], [place], [address] Phone: [phone]   Fax:  [phone]

## 2024-03-20 ENCOUNTER — Other Ambulatory Visit (HOSPITAL_COMMUNITY): Payer: Self-pay

## 2024-03-20 ENCOUNTER — Other Ambulatory Visit: Payer: Self-pay

## 2024-03-20 NOTE — Progress Notes (Signed)
 Directions on prescription are incorrect. Left message with Kristi at office twice asking provider to resend prescription with correct directions.

## 2024-03-21 ENCOUNTER — Telehealth: Payer: Self-pay

## 2024-03-21 ENCOUNTER — Other Ambulatory Visit: Payer: Self-pay

## 2024-03-21 ENCOUNTER — Other Ambulatory Visit (HOSPITAL_COMMUNITY): Payer: Self-pay

## 2024-03-21 ENCOUNTER — Ambulatory Visit (INDEPENDENT_AMBULATORY_CARE_PROVIDER_SITE_OTHER): Admitting: Otolaryngology

## 2024-03-21 ENCOUNTER — Other Ambulatory Visit: Payer: Self-pay | Admitting: *Deleted

## 2024-03-21 DIAGNOSIS — M810 Age-related osteoporosis without current pathological fracture: Secondary | ICD-10-CM

## 2024-03-21 MED ORDER — ROMOSOZUMAB-AQQG 105 MG/1.17ML ~~LOC~~ SOSY: 210.0000 mg | PREFILLED_SYRINGE | Freq: Once | SUBCUTANEOUS | Status: DC

## 2024-03-21 MED ORDER — ROMOSOZUMAB-AQQG 105 MG/1.17ML ~~LOC~~ SOSY
210.0000 mg | PREFILLED_SYRINGE | Freq: Once | SUBCUTANEOUS | 0 refills | Status: DC
  Filled 2024-03-22: qty 3.51, 1d supply, fill #0

## 2024-03-21 NOTE — Telephone Encounter (Signed)
 Pt ready for scheduling for EVENITY  on or after : 03/21/24 - PROCEED WITH [facility]   Option# 2- Med Obtained from pharmacy  Pharmacy benefit: Copay $0 (Paid to pharmacy) Admin Fee: 20% (Pay at clinic)  Prior Auth: APPROVED PA# [id number] Expiration Date: 03/21/24-03/20/25  # of doses approved: 12  If patient wants fill through the pharmacy benefit please send prescription to: [facility], and include estimated need by date in rx notes. Pharmacy will ship medication directly to the office.  Patient IS eligible for Evenity  Copay Card. Copay Card can make patient's cost as little as $25. Link to apply: [email or web address]   This summary of benefits is an estimation of the patient's out-of-pocket cost. Exact cost may very based on individual plan coverage.

## 2024-03-21 NOTE — Telephone Encounter (Signed)
 Clinical questions have been answered and PA submitted. PA currently Pending.

## 2024-03-21 NOTE — Telephone Encounter (Signed)
 Prescription and referral for Evenity  sent to [facility].   Encounter closed.

## 2024-03-21 NOTE — Telephone Encounter (Signed)
 Pharmacy Patient Advocate Encounter  Received notification from [employer] that Prior Authorization for EVENITY  has been APPROVED from 03/21/24 to 03/20/25. Ran test claim, Copay is $0. This test claim was processed through [facility]- copay amounts may vary at other pharmacies due to pharmacy/plan contracts, or as the patient moves through the different stages of their insurance plan.   PA #/Case ID/Reference #: [id number]

## 2024-03-21 NOTE — Telephone Encounter (Signed)
 Pharmacy Patient Advocate Encounter   Received notification from Physician's Office that prior authorization for EVENITY  is required/requested.   Insurance verification completed.   The patient is insured through [facility].   Per test claim: PA required; PA started via CoverMyMeds. KEY [id number] . Waiting for clinical questions to populate.

## 2024-03-22 ENCOUNTER — Other Ambulatory Visit: Payer: Self-pay

## 2024-03-22 ENCOUNTER — Encounter: Payer: Self-pay | Admitting: Obstetrics and Gynecology

## 2024-03-22 ENCOUNTER — Other Ambulatory Visit: Payer: Self-pay | Admitting: Obstetrics and Gynecology

## 2024-03-22 DIAGNOSIS — F411 Generalized anxiety disorder: Secondary | ICD-10-CM | POA: Diagnosis not present

## 2024-03-22 MED ORDER — EVENITY 105 MG/1.17ML ~~LOC~~ SOSY
210.0000 mg | PREFILLED_SYRINGE | SUBCUTANEOUS | 12 refills | Status: DC
  Filled 2024-03-22: qty 3.51, 30d supply, fill #0
  Filled 2024-03-22: qty 2.34, 30d supply, fill #0

## 2024-03-25 ENCOUNTER — Ambulatory Visit: Attending: Family Medicine | Admitting: Pharmacist

## 2024-03-25 ENCOUNTER — Other Ambulatory Visit: Payer: Self-pay

## 2024-03-25 ENCOUNTER — Other Ambulatory Visit: Payer: Self-pay | Admitting: Pharmacist

## 2024-03-25 DIAGNOSIS — Z7189 Other specified counseling: Secondary | ICD-10-CM

## 2024-03-25 MED ORDER — EVENITY 105 MG/1.17ML ~~LOC~~ SOSY
210.0000 mg | PREFILLED_SYRINGE | SUBCUTANEOUS | 12 refills | Status: AC
  Filled 2024-03-25 – 2024-04-10 (×2): qty 2.34, 30d supply, fill #0
  Filled 2024-05-13: qty 2.34, 30d supply, fill #1
  Filled 2024-05-21 – 2024-06-17 (×5): qty 2.34, 30d supply, fill #2

## 2024-03-25 NOTE — Progress Notes (Signed)
 See Ov from 03/25/24.   Ashley Marshall, PharmD, JAQUELINE, CPP Clinical Pharmacist [facility] [phone]

## 2024-03-25 NOTE — Progress Notes (Signed)
 Patient talking with cardiologist before starting Evenity . She has an appointment on 11/20

## 2024-03-25 NOTE — Progress Notes (Signed)
 Pharmacy Note  Subjective:  Patient presents today to [facility] clinic for medication review. Patient was seen by the pharmacist for counseling on Evenity .   History of cardiovascular events in the past 12 months? None - of note, does have an appt to see the Cardiologist later this month.   Adherence: has not yet started   Efficacy: has not yet started   Dosing: 210 mg subcutaneously once monthly for 12 months  Objective: CMP  No results found for: NA, K, CL, CO2, GLUCOSE, BUN, CREATININE, CALCIUM, PROT, ALBUMIN, AST, ALT, ALKPHOS, BILITOT, GFRNONAA, GFRAA  Vitamin D No results found for: VD25OH  DEXA scan: completed 12/26/2023  Assessment/Plan:  Counseled patient on purpose, proper use, and adverse effects of Evenity .  Counseled patient that Evenity  is a medication that must be injected once monthly for 12 months by a healthcare professional.  Advised patient to take calcium 1200 mg daily and vitamin D 800 units daily.  Reviewed the most common adverse effects of Evenity  including headache, osteonecrosis of the jaw, and muscle/bone pain.  Reviewed that Evenity  has FDA boxed warning for major adverse cardiovascular events. Reviewed that Evenity  may increase the risk of myocardial infarction, stroke, and cardiovascular death. Confirmed that patient has not had MI or stroke within the preceding year.  Patient confirms she does not have any major dental work planned at this time.  Reviewed with patient the signs/symptoms of low calcium and advised patient to alert us  if she experiences these symptoms.  Provided patient with medication education material and answered all questions.   Herlene Fleeta Morris, PharmD, JAQUELINE, CPP Clinical Pharmacist [facility] [phone]

## 2024-03-26 ENCOUNTER — Ambulatory Visit: Admitting: Cardiology

## 2024-03-26 ENCOUNTER — Other Ambulatory Visit (HOSPITAL_COMMUNITY): Payer: Self-pay

## 2024-03-26 ENCOUNTER — Other Ambulatory Visit: Payer: Self-pay

## 2024-03-27 ENCOUNTER — Encounter (HOSPITAL_BASED_OUTPATIENT_CLINIC_OR_DEPARTMENT_OTHER): Admitting: Physical Therapy

## 2024-04-01 ENCOUNTER — Encounter (HOSPITAL_BASED_OUTPATIENT_CLINIC_OR_DEPARTMENT_OTHER): Admitting: Physical Therapy

## 2024-04-01 ENCOUNTER — Encounter: Payer: Self-pay | Admitting: Cardiology

## 2024-04-01 NOTE — Telephone Encounter (Signed)
 Routing to Dr. Glennon to review and advise. Please advise if patient needs repeat labs prior to start of the Evenity  series.

## 2024-04-03 DIAGNOSIS — H40013 Open angle with borderline findings, low risk, bilateral: Secondary | ICD-10-CM | POA: Diagnosis not present

## 2024-04-03 DIAGNOSIS — H04123 Dry eye syndrome of bilateral lacrimal glands: Secondary | ICD-10-CM | POA: Diagnosis not present

## 2024-04-03 DIAGNOSIS — Z961 Presence of intraocular lens: Secondary | ICD-10-CM | POA: Diagnosis not present

## 2024-04-03 DIAGNOSIS — H31003 Unspecified chorioretinal scars, bilateral: Secondary | ICD-10-CM | POA: Diagnosis not present

## 2024-04-04 ENCOUNTER — Other Ambulatory Visit: Payer: Self-pay | Admitting: *Deleted

## 2024-04-04 ENCOUNTER — Encounter: Payer: Self-pay | Admitting: Cardiology

## 2024-04-04 ENCOUNTER — Ambulatory Visit: Attending: Cardiology | Admitting: Cardiology

## 2024-04-04 VITALS — BP 118/83 | HR 94 | Ht 62.5 in | Wt 142.0 lb

## 2024-04-04 DIAGNOSIS — Z79899 Other long term (current) drug therapy: Secondary | ICD-10-CM

## 2024-04-04 DIAGNOSIS — Z8249 Family history of ischemic heart disease and other diseases of the circulatory system: Secondary | ICD-10-CM | POA: Diagnosis not present

## 2024-04-04 DIAGNOSIS — E785 Hyperlipidemia, unspecified: Secondary | ICD-10-CM | POA: Diagnosis not present

## 2024-04-04 NOTE — Progress Notes (Signed)
 Cardiology Office Note:    Date:  04/04/2024   ID:  Ashley Marshall, DOB 1970-09-03, MRN [id number]  PCP:  Chrystal Lamarr RAMAN, MD    [facility] Providers Cardiologist:  Oneil Parchment, MD     Referring MD: Chrystal Lamarr RAMAN, *    History of Present Illness:    Ashley Marshall is a 53 y.o. female with recently diagnosed severe osteoporosis here to discuss Evenity , injectable medication.  This medication has been labeled with a blackbox warning for increased cardiac mace.  If you have had a stroke or prior heart attack you should not take this medication.  I have reviewed the literature/data.  Her mother at age 68 died in her sleep from possible sudden cardiac death.  Her mother's father died of an MI as well.  She is a non-smoker nondiabetic without hypertension  She does however have hyperlipidemia with LDL of 169 on 01/05/2024.  Hemoglobin A1c 5.7 hemoglobin 14.7 creatinine 0.86 ALT 15.  Coronary calcium score was 0 in 2021.  At that time, diet and exercise for her hyperlipidemia were suggested.  Works for [employer], [employer].  Past Medical History:  Diagnosis Date   Constipation    Endometriosis    Family history of breast cancer    Family history of colon cancer    Family history of pancreatic cancer    GERD (gastroesophageal reflux disease)    no meds - diet controlled   Herpes    History of kidney stones    passed stones - no surgery required   HPV in female    Hyperlipidemia    diet controlled - no meds   Missed ab    no surgery required   Seasonal allergies    SVD (spontaneous vaginal delivery)    x 2    Past Surgical History:  Procedure Laterality Date   CATARACT EXTRACTION Bilateral    COLONOSCOPY     x 2 -hx polyps   COLPOSCOPY     DILATATION & CURETTAGE/HYSTEROSCOPY WITH MYOSURE N/A 11/04/2016   Procedure: DILATATION & CURETTAGE/HYSTEROSCOPY WITH MYOSURE;  Surgeon: Lavoie, Marie-Lyne, MD;  Location: [facility];  Service:  Gynecology;  Laterality: N/A;   RETINAL DETACHMENT SURGERY Left 2000   ROBOTIC ASSISTED LAPAROSCOPIC OVARIAN CYSTECTOMY Left 11/04/2016   Procedure: ROBOTIC ASSISTED LAPAROSCOPIC OVARIAN CYSTECTOMY;  Surgeon: Lavoie, Marie-Lyne, MD;  Location: [facility];  Service: Gynecology;  Laterality: Left;  RNFA confirmed on 10/26/16 with chass   ROBOTIC ASSISTED SALPINGO OOPHERECTOMY Right 11/04/2016   Procedure: ROBOTIC ASSISTED SALPINGO OOPHORECTOMY;  Surgeon: Georgia Blonder, MD;  Location: [facility];  Service: Gynecology;  Laterality: Right;   WISDOM TOOTH EXTRACTION      Current Medications: Current Meds  Medication Sig   acetaminophen  (TYLENOL ) 500 MG tablet Take 500 mg by mouth every 6 (six) hours as needed.   cycloSPORINE  (RESTASIS ) 0.05 % ophthalmic emulsion Place 1 drop into both eyes 2 (two) times daily.   diazepam (DIASTAT ACUDIAL) 10 MG GEL Place rectally once.   FLUTICASONE  PROPIONATE, NASAL, NA    Ivermectin  (SOOLANTRA ) 1 % CREA Apply topically once daily for 30 days   MAGNESIUM PO Take by mouth.   methocarbamol  (ROBAXIN ) 500 MG tablet Take 1 tablet (500 mg total) by mouth every 8 (eight) hours as needed for muscle spasms.   polyethylene glycol (MIRALAX / GLYCOLAX) 17 g packet Take 17 g by mouth daily.   Probiotic Product (ALIGN PO) as directed Orally   Romosozumab -aqqg (EVENITY ) 105 MG/1.17ML  SOSY injection Inject 210 mg into the skin every 30 (thirty) days for 12 doses.   SOOLANTRA  1 % CREA APPLY TO THE AFFECTED AREA(S) ONCE DAILY AS DIRECTED   traZODone  (DESYREL ) 50 MG tablet Take 0.5 tablets (25 mg total) by mouth at bedtime as needed  for restlessness.   valACYclovir  (VALTREX ) 500 MG tablet Take 1 tablet (500 mg total) by mouth daily. Prophylaxis   VITAMIN D PO Take 1 tablet by mouth daily.   [DISCONTINUED] cycloSPORINE  (RESTASIS ) 0.05 % ophthalmic emulsion INSTILL 1 DROP INTO BOTH EYES TWICE DAILY   [DISCONTINUED] omeprazole  (PRILOSEC) 20 MG capsule Take 1 capsule (20 mg total) by  mouth 1/2(half) to 1 hour before morning meal   [DISCONTINUED] omeprazole  (PRILOSEC) 40 MG capsule Take 1 capsule (40 mg total) by mouth 2 (two) times daily.   [DISCONTINUED] traZODone  (DESYREL ) 50 MG tablet Take 1/2 tablet by mouth at bedtime as needed for restlessness     Allergies:   Ambien [zolpidem tartrate], Famotidine, Metronidazole, Septra [sulfamethoxazole-trimethoprim], and Zolpidem   Social History   Socioeconomic History   Marital status: Married    Spouse name: Not on file   Number of children: Not on file   Years of education: Not on file   Highest education level: Not on file  Occupational History   Not on file  Tobacco Use   Smoking status: Never   Smokeless tobacco: Never  Vaping Use   Vaping status: Never Used  Substance and Sexual Activity   Alcohol use: Not Currently    Alcohol/week: 1.0 standard drink of alcohol    Types: 1 Glasses of wine per week    Comment: i glass a week of wine   Drug use: No   Sexual activity: Not Currently    Partners: Male    Birth control/protection: Post-menopausal    Comment: 1st intercourse- 22, partners- 2  Other Topics Concern   Not on file  Social History Narrative   Are you right handed or left handed? Right   Are you currently employed ?    What is your current occupation? [employer]   Do you live at home alone?   Who lives with you? family   What type of home do you live in: 1 story or 2 story? two   Caffeine 15 oz of soda    Social Drivers of Corporate Investment Banker Strain: Not on file  Food Insecurity: Not on file  Transportation Needs: Not on file  Physical Activity: Not on file  Stress: Not on file  Social Connections: Not on file     Family History: The patient's family history includes Breast cancer (age of onset: 46) in her paternal aunt; Colon cancer (age of onset: 29) in her paternal aunt; Colon cancer (age of onset: 1) in her paternal grandmother; Colon cancer (age of onset: 93) in her  paternal grandfather; Diabetes in her father and paternal grandmother; Early death in her father; Heart attack in her maternal grandfather, maternal grandmother, and maternal uncle; Pancreatic cancer in her paternal uncle.  ROS:   Please see the history of present illness.    Denies any fevers chills nausea vomiting syncope bleeding all other systems reviewed and are negative.  EKGs/Labs/Other Studies Reviewed:    The following studies were reviewed today:  Coronary calcium score performed in 2021 was 0  EKG:  The ekg ordered today demonstrates normal sinus rhythm no adverse arrhythmias no ST segment changes  Recent Labs: No results found  for requested labs within last 365 days.  Recent Lipid Panel No results found for: CHOL, TRIG, HDL, CHOLHDL, VLDL, LDLCALC, LDLDIRECT   Risk Assessment/Calculations:               Physical Exam:    VS:  BP 118/83   Pulse 94   Ht 5' 2.5 (1.588 m)   Wt 142 lb (64.4 kg)   LMP 09/02/2018   SpO2 98%   BMI 25.56 kg/m     Wt Readings from Last 3 Encounters:  04/04/24 142 lb (64.4 kg)  02/05/24 143 lb 3.2 oz (65 kg)  11/21/23 144 lb (65.3 kg)     GEN:  Well nourished, well developed in no acute distress HEENT: Normal NECK: No JVD; No carotid bruits LYMPHATICS: No lymphadenopathy CARDIAC: RRR, no murmurs, rubs, gallops RESPIRATORY:  Clear to auscultation without rales, wheezing or rhonchi  ABDOMEN: Soft, non-tender, non-distended MUSCULOSKELETAL:  No edema; No deformity  SKIN: Warm and dry NEUROLOGIC:  Alert and oriented x 3 PSYCHIATRIC:  Normal affect   ASSESSMENT:    1. Family history of coronary artery disease   2. Medication management   3. Hyperlipidemia, unspecified hyperlipidemia type    PLAN:    In order of problems listed above:  We will go ahead and order a coronary calcium score since it has been approximately 4 years.  We will also check a LP(a) for further restratification.  Thankfully she has not  had a cardiac event at this point.  Her LDL is fairly elevated however at 169.  I think that if the calcium score is elevated now or if her LP(a) is elevated I would encourage her use of Crestor 5 mg once a day.  With lipid panel in 3 months following initiation.  If LP(a) is normal and coronary calcium score remains 0 it may not be unreasonable for her to pursue Evenity  understanding that there is there small incremental risk of cardiac mace.  She does state that there have been individuals that have taken this new her medication for 3 to 4 months and have felt palpitations and have switched over to Fosamax for instance.  She may be able to obtain a substantial benefit from even taking the medication for 3 to 4 months in rebuilding bone health.  Let us  see where her calcium score and LP(a) lie.  We will follow-up with results of studies          Medication Adjustments/Labs and Tests Ordered: Current medicines are reviewed at length with the patient today.  Concerns regarding medicines are outlined above.  Orders Placed This Encounter  Procedures   CT CARDIAC SCORING (SELF PAY ONLY)   Lipoprotein A (LPA)   EKG 12-Lead   No orders of the defined types were placed in this encounter.   Patient Instructions  Medication Instructions:  The current medical regimen is effective;  continue present plan and medications.  *If you need a refill on your cardiac medications before your next appointment, please call your pharmacy*  Lab Work: Please return for lab work (LPa) you do not need to be fasting.  If you have labs (blood work) drawn today and your tests are completely normal, you will receive your results only by: MyChart Message (if you have MyChart) OR A paper copy in the mail If you have any lab test that is abnormal or we need to change your treatment, we will call you to review the results.  Testing/Procedures: Your physician has requested that  you have a Coronary Calcium score  which is completed by CT. Cardiac computed tomography (CT) is a painless test that uses an x-ray machine to take clear, detailed pictures of your heart. There are no instructions for this testing.  You may eat/drink and take your normal medications this day.  The cost of the testing is $99 due at the time of your appointment.  Follow-Up: At [facility], you and your health needs are our priority.  As part of our continuing mission to provide you with exceptional heart care, our providers are all part of one team.  This team includes your primary Cardiologist (physician) and Advanced Practice Providers or APPs (Physician Assistants and Nurse Practitioners) who all work together to provide you with the care you need, when you need it.  Your next appointment:   Follow up will be based on the results of the above testing.   We recommend signing up for the patient portal called MyChart.  Sign up information is provided on this After Visit Summary.  MyChart is used to connect with patients for Virtual Visits (Telemedicine).  Patients are able to view lab/test results, encounter notes, upcoming appointments, etc.  Non-urgent messages can be sent to your provider as well.   To learn more about what you can do with MyChart, go to [email or web address].          Signed, Oneil Parchment, MD  04/04/2024 4:47 PM    [facility]

## 2024-04-04 NOTE — Patient Instructions (Signed)
 Medication Instructions:  The current medical regimen is effective;  continue present plan and medications.  *If you need a refill on your cardiac medications before your next appointment, please call your pharmacy*  Lab Work: Please return for lab work (LPa) you do not need to be fasting.  If you have labs (blood work) drawn today and your tests are completely normal, you will receive your results only by: MyChart Message (if you have MyChart) OR A paper copy in the mail If you have any lab test that is abnormal or we need to change your treatment, we will call you to review the results.  Testing/Procedures: Your physician has requested that you have a Coronary Calcium score which is completed by CT. Cardiac computed tomography (CT) is a painless test that uses an x-ray machine to take clear, detailed pictures of your heart. There are no instructions for this testing.  You may eat/drink and take your normal medications this day.  The cost of the testing is $99 due at the time of your appointment.  Follow-Up: At [facility], you and your health needs are our priority.  As part of our continuing mission to provide you with exceptional heart care, our providers are all part of one team.  This team includes your primary Cardiologist (physician) and Advanced Practice Providers or APPs (Physician Assistants and Nurse Practitioners) who all work together to provide you with the care you need, when you need it.  Your next appointment:   Follow up will be based on the results of the above testing.   We recommend signing up for the patient portal called MyChart.  Sign up information is provided on this After Visit Summary.  MyChart is used to connect with patients for Virtual Visits (Telemedicine).  Patients are able to view lab/test results, encounter notes, upcoming appointments, etc.  Non-urgent messages can be sent to your provider as well.   To learn more about what you can do with  MyChart, go to [email or web address].

## 2024-04-06 LAB — LIPOPROTEIN A (LPA): Lipoprotein (a): <8.4 nmol/L (ref <75.0)

## 2024-04-07 NOTE — Therapy (Signed)
 OUTPATIENT PHYSICAL THERAPY LOWER EXTREMITY TREATMENT   PROGRESS NOTE   Patient Name: Ashley Marshall MRN: [id number] DOB:04/22/1971, 53 y.o., female Today's Date: 04/09/2024  END OF SESSION:  PT End of Session - 04/08/24 1455     Visit Number 4    Number of Visits 4    Authorization Type [place] AETNA    PT Start Time 1452    PT Stop Time 1540    PT Time Calculation (min) 48 min    Activity Tolerance Patient tolerated treatment well    Behavior During Therapy WFL for tasks assessed/performed            Past Medical History:  Diagnosis Date   Constipation    Endometriosis    Family history of breast cancer    Family history of colon cancer    Family history of pancreatic cancer    GERD (gastroesophageal reflux disease)    no meds - diet controlled   Herpes    History of kidney stones    passed stones - no surgery required   HPV in female    Hyperlipidemia    diet controlled - no meds   Missed ab    no surgery required   Seasonal allergies    SVD (spontaneous vaginal delivery)    x 2   Past Surgical History:  Procedure Laterality Date   CATARACT EXTRACTION Bilateral    COLONOSCOPY     x 2 -hx polyps   COLPOSCOPY     DILATATION & CURETTAGE/HYSTEROSCOPY WITH MYOSURE N/A 11/04/2016   Procedure: DILATATION & CURETTAGE/HYSTEROSCOPY WITH MYOSURE;  Surgeon: Lavoie, Marie-Lyne, MD;  Location: [facility];  Service: Gynecology;  Laterality: N/A;   RETINAL DETACHMENT SURGERY Left 2000   ROBOTIC ASSISTED LAPAROSCOPIC OVARIAN CYSTECTOMY Left 11/04/2016   Procedure: ROBOTIC ASSISTED LAPAROSCOPIC OVARIAN CYSTECTOMY;  Surgeon: Lavoie, Marie-Lyne, MD;  Location: [facility];  Service: Gynecology;  Laterality: Left;  RNFA confirmed on 10/26/16 with chass   ROBOTIC ASSISTED SALPINGO OOPHERECTOMY Right 11/04/2016   Procedure: ROBOTIC ASSISTED SALPINGO OOPHORECTOMY;  Surgeon: Georgia Blonder, MD;  Location: [facility];  Service: Gynecology;  Laterality: Right;   WISDOM TOOTH EXTRACTION      Patient Active Problem List   Diagnosis Date Noted   Age-related osteoporosis without current pathological fracture 02/05/2024   At high risk for breast cancer 10/26/2023   Genetic testing 09/26/2023   Family history of pancreatic cancer    Family history of colon cancer    Family history of breast cancer      REFERRING PROVIDER: Chrystal Lamarr RAMAN, MD  REFERRING DIAG: M81.0 (ICD-10-CM) - Age-related osteoporosis without current pathological fracture  THERAPY DIAG:  Muscle weakness (generalized)  Rationale for Evaluation and Treatment: Rehabilitation  ONSET DATE: PT order 01/09/24  SUBJECTIVE:   SUBJECTIVE STATEMENT: Pt reports she is going to take an osteoporotic medication.  She saw a cardiologist and is in the process of getting testing due to possible side effects of medication. Pt states she felt good after prior treatment.  Pt reports compliance with HEP.  Pt reports her shoulder flared up one time after home exercises though it resolved.  Pt reports she has been performing her walking program 3x/wk and does stairs.  Pt states walking helps her back. Pt is more mindful and careful of movements and activities.  She doesn't report any improved confidence with daily activities and mobility.  Pt states she is mindful of her core and pulls her navel in with certain movements.  PERTINENT HISTORY: Severe osteoporosis notalgia paresthetica--abnormal sensation in L sided thoracic area Endometriosis and had surgery in 2018   PAIN:  Location:  lower back and thoracic/scapula NPRS:  0/10 Description: Tightness Being in one position for too long, movement makes it better   PRECAUTIONS: Other: per dx     WEIGHT BEARING RESTRICTIONS: No  FALLS:  Has patient fallen in last 6 months? 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   PLOF:  Independent  PATIENT GOALS:  learn what would help prevent a fracture, improve strength, learn an appropriate exercise program    OBJECTIVE:  Note: Objective measures were completed at Evaluation unless otherwise noted.  DIAGNOSTIC FINDINGS:  MRI in 2023: Alignment: Trace scoliosis. Alignment otherwise normal with preservation of the normal lumbar lordosis. No listhesis.  IMPRESSION: 1. Mild degenerative disc bulge and facet hypertrophy at L4-5 with resultant mild left L4 foraminal stenosis, but no overt neural impingement. 2. Minimal noncompressive disc bulging at L3-4 without stenosis or neural impingement. 3. Otherwise essentially normal MRI of the lumbar spine. No other findings to explain patient's symptoms identified.  PATIENT SURVEYS:  LEFS:  60/80 (Initial)  COGNITION: Overall cognitive status: Within functional limits for tasks assessed       LOWER EXTREMITY MMT:  MMT Right eval Left eval Right 11/24 Left 11/24  Hip flexion 35.3 34.9    Hip extension      Hip abduction 30.6 ; 4-/5 37.8 ; 4/5 4+/5 4/5  Hip adduction      Hip internal rotation      Hip external rotation      Knee flexion 5/5 5/5    Knee extension 4/5 4/5 4+/5 5/5  Ankle dorsiflexion      Ankle plantarflexion      Ankle inversion      Ankle eversion       (Blank rows = not tested)   FUNCTIONAL TESTS:  5x STS test:  9.02 sec    GAIT: Assistive device utilized: None Level of assistance: Complete Independence Comments: WFL, pt had no limp                                                                                                                                TREATMENT:   04/08/24 Reviewed pt presentation, pain level, and HEP compliance. Reviewed HEP and gym program.  PT instructed pt in appropriate exercises and correct form.  PT answered pt's questions.     Supine bridge Lateral band walks with RTB around LE's above knees Pt received a HEP handout and was educated in  correct form and appropriate frequency.   LEFS:  71/80  03/19/24: -Reviewed current gym routine - decompression position: red band 2 x 10 each:  D2 flexion, horz abdct, bil flexion (touch down), and bil ER  - prone opposite arm /  leg lift x 5 each side, head in neutral position - reviewed form with her current exercises:  push up from rail at track x 5 (cues to not round back), TRX row x 5 (cues to keep shoulders depressed), STS with medicine ball push out x 3 -HEP updated and issued, with long red band  03/15/24  Education on which machines in the gym would be good to use- form, reps, machine set up for lat pulls, rows with cables and on static machines, tricep press static machine and with cables, discussed hip machines- discouraged crunch machine and hip ABD machine/encouraged leg press; demonstrated and tried UBE and discussed using under desk bike at home to replicate this for postural mm endurance, discussed core strength, discussed mechanism of how body puts    Practiced 3 way reaches yellow TB, hip ABD yellow TB, hip hikes, tandem stance, tandem walks   PATIENT EDUCATION:  Education details: Dx, importance of Wb'ing activities/exercises, movements to avoid, prognosis, rationale of interventions, POC, objective findings, and relevant anatomy.  PT answered pt's questions.  Person educated: Patient Education method: Medical Illustrator, handout Education comprehension: verbalized understanding, returned demonstration  HOME EXERCISE PROGRAM:  Access Code: [email or web address] URL: [email or web address] Date: 03/15/2024 Prepared by: Josette Rough     ASSESSMENT:  CLINICAL IMPRESSION:  Pt has been performing her HEP, gym program, and walking program.  PT reviewed her gym program.  PT went into the gym with pt and she informed PT of her current exercise routine and demonstrated select exercises.  PT educated pt in correct form of exercises.  PT also educated pt in  correct exercises and which ones to avoid.  PT answered pt's questions and pt demonstrates good understanding.  PT instructed pt to be aware of her scapular pain and to not perform exercises that exacerbate notalgia paresthetica.  PT reviewed HEP and updated HEP.   She is independent with HEP and gym program.  Pt doesn't report any improved confidence with daily activities and mobility.  Pt demonstrates improved strength in bilat knee extension and R hip abduction.  Though she has improved with strength, she continues to have weakness in L hip abduction and minimal weakness in R hip abduction and R knee extension.  Pt demonstrates clinically significant improvement in self perceived disability with LEFS improving from 60/80 initially to 71/80.  Pt met STG #1 and LTG #2.  Pt is ready for discharge.     OBJECTIVE IMPAIRMENTS: decreased strength and pain.   ACTIVITY LIMITATIONS: lifting, bending, and transfers  PARTICIPATION LIMITATIONS:   PERSONAL FACTORS: 1 comorbidity: notalgia paresthetica are also affecting patient's functional outcome.   REHAB POTENTIAL: Good  CLINICAL DECISION MAKING: Stable/uncomplicated  EVALUATION COMPLEXITY: Low   GOALS:   SHORT TERM GOALS: Target date: 03/21/2024  Pt will be independent and compliant with HEP for improved strength, pain, and postural stabilization. Baseline: Goal status: GOAL MET  04/08/24    LONG TERM GOALS: Target date:  04/04/2024   Pt will demo improved bilat knee extension and hip abduction strength to 5/5 MMT for improved strength in LE's for performance of functional mobility.   Baseline:  Goal status:  25% MET  2.  Pt will demo good understanding of gym exercises and home program for appropriate loading to promote bone health and strength.  Baseline:  Goal status: GOAL MET  04/08/24  3.  Pt will report improved confidence with daily mobility and daily activities.  Baseline:  Goal status: NOT MET  PLAN:  PT FREQUENCY:  1 visit  PT DURATION: 1 visit  PLANNED INTERVENTIONS: [id number]- PT Re-evaluation, 97750- Physical Performance Testing, 97110-Therapeutic exercises, 97530- Therapeutic activity, [id number]- Neuromuscular re-education, 97535- Self Care, [id number]- Manual therapy, [phone]- Gait training, [phone]- Aquatic Therapy, [phone]- Electrical stimulation (unattended), [phone]- Electrical stimulation (manual), [id number]- Ultrasound, 79439 (1-2 muscles), 20561 (3+ muscles)- Dry Needling, Patient/Family education, Stair training, Taping, Cryotherapy, and Moist heat  PLAN FOR NEXT SESSION:  Pt to be discharged from skilled PT due to being pleased with current level and independence with gym and home program.  Pt will continue with gym, home, and walking program.  Pt is agreeable with discharge.   PHYSICAL THERAPY DISCHARGE SUMMARY  Visits from Start of Care: 4  Current functional level related to goals / functional outcomes: See above   Remaining deficits: See above   Education / Equipment: See above   Leigh Minerva III PT, DPT 04/09/24 9:44 PM  [facility] [address] [place], [place], [address] Phone: [phone]   Fax:  [phone]

## 2024-04-08 ENCOUNTER — Other Ambulatory Visit: Payer: Self-pay

## 2024-04-08 ENCOUNTER — Ambulatory Visit (HOSPITAL_BASED_OUTPATIENT_CLINIC_OR_DEPARTMENT_OTHER): Admitting: Physical Therapy

## 2024-04-08 ENCOUNTER — Ambulatory Visit: Payer: Self-pay | Admitting: Cardiology

## 2024-04-08 DIAGNOSIS — M6281 Muscle weakness (generalized): Secondary | ICD-10-CM | POA: Diagnosis not present

## 2024-04-09 ENCOUNTER — Ambulatory Visit (HOSPITAL_COMMUNITY)
Admission: RE | Admit: 2024-04-09 | Discharge: 2024-04-09 | Disposition: A | Discharge status: Home / Self Care | Payer: Self-pay | Source: Ambulatory Visit | Attending: Internal Medicine | Admitting: Internal Medicine

## 2024-04-09 ENCOUNTER — Encounter (HOSPITAL_BASED_OUTPATIENT_CLINIC_OR_DEPARTMENT_OTHER): Payer: Self-pay | Admitting: Physical Therapy

## 2024-04-09 DIAGNOSIS — Z8249 Family history of ischemic heart disease and other diseases of the circulatory system: Secondary | ICD-10-CM | POA: Insufficient documentation

## 2024-04-09 DIAGNOSIS — E785 Hyperlipidemia, unspecified: Secondary | ICD-10-CM | POA: Insufficient documentation

## 2024-04-10 ENCOUNTER — Other Ambulatory Visit: Payer: Self-pay

## 2024-04-10 NOTE — Progress Notes (Signed)
 Specialty Pharmacy Initial Fill Coordination Note  Ashley Marshall is a 53 y.o. female contacted today regarding initial fill of specialty medication(s) Romosozumab -aqqg (Evenity )   Patient requested Courier to Provider Office   Delivery date: 04/15/24   Verified address: [facility] [place] [facility] [phone]   Medication will be filled on: 04/12/24   Patient is aware of $0 copayment.

## 2024-04-12 DIAGNOSIS — F411 Generalized anxiety disorder: Secondary | ICD-10-CM | POA: Diagnosis not present

## 2024-04-15 ENCOUNTER — Encounter (HOSPITAL_BASED_OUTPATIENT_CLINIC_OR_DEPARTMENT_OTHER): Admitting: Physical Therapy

## 2024-04-15 ENCOUNTER — Other Ambulatory Visit: Payer: Self-pay | Admitting: Obstetrics and Gynecology

## 2024-04-15 DIAGNOSIS — Z1231 Encounter for screening mammogram for malignant neoplasm of breast: Secondary | ICD-10-CM

## 2024-04-17 DIAGNOSIS — G4733 Obstructive sleep apnea (adult) (pediatric): Secondary | ICD-10-CM | POA: Diagnosis not present

## 2024-04-17 NOTE — Telephone Encounter (Signed)
 Patient scheduled for 06/13/23

## 2024-04-18 ENCOUNTER — Ambulatory Visit (INDEPENDENT_AMBULATORY_CARE_PROVIDER_SITE_OTHER): Admitting: Otolaryngology

## 2024-04-18 ENCOUNTER — Other Ambulatory Visit: Payer: Self-pay | Admitting: *Deleted

## 2024-04-18 ENCOUNTER — Encounter (INDEPENDENT_AMBULATORY_CARE_PROVIDER_SITE_OTHER): Payer: Self-pay | Admitting: Otolaryngology

## 2024-04-18 VITALS — BP 124/85 | HR 88 | Temp 97.9°F | Ht 62.5 in | Wt 142.0 lb

## 2024-04-18 DIAGNOSIS — G4733 Obstructive sleep apnea (adult) (pediatric): Secondary | ICD-10-CM | POA: Diagnosis not present

## 2024-04-18 DIAGNOSIS — J342 Deviated nasal septum: Secondary | ICD-10-CM

## 2024-04-18 DIAGNOSIS — Z9989 Dependence on other enabling machines and devices: Secondary | ICD-10-CM

## 2024-04-18 DIAGNOSIS — J343 Hypertrophy of nasal turbinates: Secondary | ICD-10-CM | POA: Diagnosis not present

## 2024-04-18 DIAGNOSIS — J310 Chronic rhinitis: Secondary | ICD-10-CM

## 2024-04-18 NOTE — Progress Notes (Unsigned)
 Patient ID: Ashley Marshall, female   DOB: Jan 24, 1971, 53 y.o.   MRN: [id number]  Follow-up: Chronic nasal obstruction, recurrent sinus infections  History of Present Illness Ashley Marshall is a 53 year old female who returns today for her follow-up evaluation.  The patient was previously seen for chronic nasal obstruction and recurrent sinusitis.  She was treated with Flonase  nasal spray and nasal saline irrigation.  She continues to have significant nasal congestion despite the regular use of Flonase . She initially reduced her dosage from two sprays to one, resulting in morning headaches, but has since resumed the original dosage of two sprays per nostril daily, which has been effective in preventing sinus infections.  She was diagnosed with severe osteoporosis in August following a bone density test that revealed a T-score of -3.3. She is currently taking calcium supplements and is scheduled to start Evenity  injections soon.  She underwent a sleep apnea test and was diagnosed with mild to moderate sleep apnea. She is considering treatment options, including an oral device or CPAP machine. She has concerns about the impact of her deviated septum on CPAP use and the potential for osteonecrosis of the jaw with oral devices.  Exam: General: Communicates without difficulty, well nourished, no acute distress. Head: Normocephalic, no evidence injury, no tenderness, facial buttresses intact without stepoff. Face/sinus: No tenderness to palpation and percussion. Facial movement is normal and symmetric. Eyes: PERRL, EOMI. No scleral icterus, conjunctivae clear. Neuro: CN II exam reveals vision grossly intact.  No nystagmus at any point of gaze. Ears: Auricles well formed without lesions.  Ear canals are intact without mass or lesion.  No erythema or edema is appreciated.  The TMs are intact without fluid. Nose: External evaluation reveals normal support and skin without lesions.  Dorsum is intact.  Anterior  rhinoscopy reveals congested mucosa over anterior aspect of inferior turbinates and deviated septum.  No purulence noted. Oral:  Oral cavity and oropharynx are intact, symmetric, without erythema or edema.  Mucosa is moist without lesions. Neck: Full range of motion without pain.  There is no significant lymphadenopathy.  No masses palpable.  Thyroid  bed within normal limits to palpation.  Parotid glands and submandibular glands equal bilaterally without mass.  Trachea is midline. Neuro:  CN 2-12 grossly intact.    Assessment & Plan Chronic rhinitis and nasal mucosal congestion, nasal septal deviation, and bilateral inferior turbinate hypertrophy. Persistent nasal obstruction despite medical treatment.  More than 95% of her nasal passageways are obstructed bilaterally.  No acute infection is noted today. - Continue Flonase  nasal spray, two sprays per side. - Inform eye doctor about Flonase  use. - If her symptoms persist, she may benefit from surgical intervention with septoplasty and bilateral inferior turbinate reduction.  The risk, benefits, and details of the procedures are extensively discussed.  Questions are invited and answered.  Obstructive sleep apnea - The patient is encouraged to use her CPAP machine daily. - If she has difficulty using the CPAP machine due to her nasal obstruction, surgical intervention with septoplasty and turbinate reduction may be needed in the future to improve her CPAP compliance.

## 2024-04-20 DIAGNOSIS — J343 Hypertrophy of nasal turbinates: Secondary | ICD-10-CM | POA: Insufficient documentation

## 2024-04-20 DIAGNOSIS — J342 Deviated nasal septum: Secondary | ICD-10-CM | POA: Insufficient documentation

## 2024-04-20 DIAGNOSIS — J310 Chronic rhinitis: Secondary | ICD-10-CM | POA: Insufficient documentation

## 2024-04-20 DIAGNOSIS — G4733 Obstructive sleep apnea (adult) (pediatric): Secondary | ICD-10-CM | POA: Insufficient documentation

## 2024-04-22 ENCOUNTER — Ambulatory Visit

## 2024-04-22 ENCOUNTER — Other Ambulatory Visit: Payer: Self-pay

## 2024-04-22 ENCOUNTER — Other Ambulatory Visit (HOSPITAL_COMMUNITY): Payer: Self-pay

## 2024-04-22 ENCOUNTER — Telehealth: Payer: Self-pay

## 2024-04-22 DIAGNOSIS — M810 Age-related osteoporosis without current pathological fracture: Secondary | ICD-10-CM

## 2024-04-22 MED ORDER — ROMOSOZUMAB-AQQG 105 MG/1.17ML ~~LOC~~ SOSY
210.0000 mg | PREFILLED_SYRINGE | SUBCUTANEOUS | Status: AC
  Administered 2024-04-22: 210 mg via SUBCUTANEOUS

## 2024-04-22 NOTE — Progress Notes (Signed)
 04/22/24: 1st Evenity  injections given SQ, one in each arm, per Pt's request. (IC, [facility]). Patient supplied meds.

## 2024-04-22 NOTE — Progress Notes (Signed)
 Clinical Intervention Note  Clinical Intervention Notes: Patient called to discuss appropriate lab work intervals for calcium when starting Evenity . Advised that as long as calcium is WNL before initiating treatment, she should continue supplementation daily and monitor for signs of hypocalcemia. Provider will follow up with appropriate labs, likely yearly. Patient was only taking 500mg  of calcium once daily. Advised her to increase to twice daily. Patient is in agreement.   Clinical Intervention Outcomes: Prevention of an adverse drug event   Ashley Marshall Specialty Pharmacist

## 2024-04-22 NOTE — Telephone Encounter (Signed)
 Med obtained from pharmacy and shipped to clinic:  Pharmacy benefit: Copay $RTS- LAST FILLED 04/12/24 (Paid to pharmacy) Admin Fee: 20% (Pay at clinic)  Prior Auth: APPROVED PA# [id number] Expiration Date: 03/21/24-03/20/25  # of doses approved: 12   Patient IS eligible for Copay Card. Copay Card can make patient's cost as little as $25. Link to apply: [email or web address]://[email or web address]  ** This summary of benefits is an estimation of the patient's out-of-pocket cost. Exact cost may very based on individual plan coverage.

## 2024-04-26 ENCOUNTER — Other Ambulatory Visit: Payer: Self-pay

## 2024-04-29 ENCOUNTER — Other Ambulatory Visit (HOSPITAL_COMMUNITY): Payer: Self-pay

## 2024-04-29 MED ORDER — CYCLOSPORINE 0.05 % OP EMUL
1.0000 [drp] | Freq: Two times a day (BID) | OPHTHALMIC | 3 refills | Status: AC
  Filled 2024-04-29: qty 180, 90d supply, fill #0

## 2024-04-30 ENCOUNTER — Other Ambulatory Visit: Payer: Self-pay

## 2024-04-30 ENCOUNTER — Other Ambulatory Visit (HOSPITAL_COMMUNITY): Payer: Self-pay

## 2024-05-03 ENCOUNTER — Other Ambulatory Visit: Payer: Self-pay

## 2024-05-10 DIAGNOSIS — F411 Generalized anxiety disorder: Secondary | ICD-10-CM | POA: Diagnosis not present

## 2024-05-13 ENCOUNTER — Ambulatory Visit: Admission: RE | Admit: 2024-05-13 | Discharge: 2024-05-13 | Disposition: A | Source: Ambulatory Visit

## 2024-05-13 ENCOUNTER — Other Ambulatory Visit: Payer: Self-pay

## 2024-05-13 ENCOUNTER — Other Ambulatory Visit (HOSPITAL_COMMUNITY): Payer: Self-pay

## 2024-05-13 DIAGNOSIS — Z1231 Encounter for screening mammogram for malignant neoplasm of breast: Secondary | ICD-10-CM

## 2024-05-13 NOTE — Progress Notes (Signed)
 Specialty Pharmacy Refill Coordination Note  In-office administered. Patient/Guardian authorizes monthly copay charge  Ashley Marshall is a 53 y.o. female contacted today regarding refills of specialty medication(s) Romosozumab -aqqg (Evenity )  Injection appointment: 05/24/24  Patient requested: Courier to Provider Office   Delivery date: 05/20/24   Verified address: [facility] [address] [place] [address]  Medication will be filled on 05/17/24 .

## 2024-05-15 ENCOUNTER — Ambulatory Visit: Payer: Self-pay | Admitting: Obstetrics and Gynecology

## 2024-05-21 ENCOUNTER — Other Ambulatory Visit: Payer: Self-pay

## 2024-05-21 ENCOUNTER — Other Ambulatory Visit (HOSPITAL_COMMUNITY): Payer: Self-pay

## 2024-05-21 MED ORDER — KETOCONAZOLE 2 % EX SHAM
MEDICATED_SHAMPOO | CUTANEOUS | 5 refills | Status: AC
  Filled 2024-05-21: qty 120, 30d supply, fill #0

## 2024-05-22 ENCOUNTER — Other Ambulatory Visit: Payer: Self-pay

## 2024-05-22 ENCOUNTER — Other Ambulatory Visit: Payer: Self-pay | Admitting: *Deleted

## 2024-05-22 DIAGNOSIS — M810 Age-related osteoporosis without current pathological fracture: Secondary | ICD-10-CM

## 2024-05-22 MED ORDER — ROMOSOZUMAB-AQQG 105 MG/1.17ML ~~LOC~~ SOSY
210.0000 mg | PREFILLED_SYRINGE | SUBCUTANEOUS | Status: AC
  Administered 2024-05-24: 210 mg via SUBCUTANEOUS

## 2024-05-23 ENCOUNTER — Other Ambulatory Visit (HOSPITAL_COMMUNITY): Payer: Self-pay

## 2024-05-24 ENCOUNTER — Ambulatory Visit

## 2024-05-24 DIAGNOSIS — M810 Age-related osteoporosis without current pathological fracture: Secondary | ICD-10-CM

## 2024-05-24 NOTE — Progress Notes (Signed)
 Evenity  injection given SQ left arm and SQ right arm.  Patient tolerated injection well.  Last AEX 11/21/23 Dr. Glennon

## 2024-06-04 ENCOUNTER — Other Ambulatory Visit: Payer: Self-pay

## 2024-06-11 ENCOUNTER — Telehealth (INDEPENDENT_AMBULATORY_CARE_PROVIDER_SITE_OTHER): Payer: Self-pay

## 2024-06-11 NOTE — Telephone Encounter (Signed)
 Per Dr. Karis I called patient back and made her an appointment for 06/12/24 at 2:50 pm.

## 2024-06-11 NOTE — Telephone Encounter (Signed)
 Patient left voicemail stating that she is having a lot of sinus issues, redness and soreness at the tip of her nose, and soreness on the inside of her nose. She has been doing the saline rinses and using flonase . Patient wants to know if she needs to come in to be seen or if Dr. Karis could send in a medication for this. Please advise.

## 2024-06-12 ENCOUNTER — Other Ambulatory Visit (HOSPITAL_COMMUNITY): Payer: Self-pay

## 2024-06-12 ENCOUNTER — Ambulatory Visit (INDEPENDENT_AMBULATORY_CARE_PROVIDER_SITE_OTHER): Admitting: Otolaryngology

## 2024-06-12 ENCOUNTER — Encounter (INDEPENDENT_AMBULATORY_CARE_PROVIDER_SITE_OTHER): Payer: Self-pay | Admitting: Otolaryngology

## 2024-06-12 VITALS — BP 127/85 | HR 90 | Ht 62.5 in | Wt 145.0 lb

## 2024-06-12 DIAGNOSIS — L0109 Other impetigo: Secondary | ICD-10-CM | POA: Diagnosis not present

## 2024-06-12 DIAGNOSIS — J3489 Other specified disorders of nose and nasal sinuses: Secondary | ICD-10-CM | POA: Diagnosis not present

## 2024-06-12 DIAGNOSIS — J342 Deviated nasal septum: Secondary | ICD-10-CM | POA: Diagnosis not present

## 2024-06-12 DIAGNOSIS — J343 Hypertrophy of nasal turbinates: Secondary | ICD-10-CM

## 2024-06-12 DIAGNOSIS — G4733 Obstructive sleep apnea (adult) (pediatric): Secondary | ICD-10-CM

## 2024-06-12 DIAGNOSIS — J310 Chronic rhinitis: Secondary | ICD-10-CM

## 2024-06-12 MED ORDER — MUPIROCIN 2 % EX OINT
1.0000 | TOPICAL_OINTMENT | Freq: Three times a day (TID) | CUTANEOUS | 3 refills | Status: AC | PRN
  Filled 2024-06-12: qty 22, 8d supply, fill #0

## 2024-06-12 NOTE — Progress Notes (Signed)
 Patient ID: Ashley Marshall, female   DOB: 12-19-1970, 54 y.o.   MRN: [id number]  Follow-up: Chronic nasal obstruction, recurrent sinus infections   History of Present Illness Ashley Marshall is a 54 year old female with chronic nasal obstruction due to septal deviation and turbinate hypertrophy who presents with persistent nasal congestion and difficulty breathing through her nose.  She has longstanding severe nasal obstruction with minimal ability to breathe through her nose. She describes being unable to breathe through her nose and requires frequent use of intranasal corticosteroids (Flonase ) and saline spray, which provide only minimal relief. She has experienced fewer sinus infections since starting Flonase , but since December, her sinus symptoms have worsened and she is unable to achieve adequate control.  She is hesitant to initiate CPAP therapy for obstructive sleep apnea due to the severity of her nasal obstruction, anticipating intolerance to the device. She expresses concern about long-term use of intranasal steroids due to her ophthalmologist's concern for cupping of the retina, a precursor for glaucoma, although she has not been diagnosed with glaucoma and her intraocular pressures have remained normal.  In December, she developed a small erythematous papule on the tip of her nose, followed by significant pain in her right nostril two days prior to this visit. The pain has since improved but remains mildly tender, particularly in the upper aspect of the nostril. She has not previously experienced similar episodes and has not used topical antibiotics for this area. She denies current severe pain.  She expresses a desire to reduce her dependence on intranasal corticosteroids due to concerns about glaucoma risk and is interested in surgical options to improve nasal breathing, including the possibility of combining septoplasty with rhinoplasty for both functional and cosmetic  improvement.  Exam: General: Communicates without difficulty, well nourished, no acute distress. Head: Normocephalic, no evidence injury, no tenderness, facial buttresses intact without stepoff. Face/sinus: No tenderness to palpation and percussion. Facial movement is normal and symmetric. Eyes: PERRL, EOMI. No scleral icterus, conjunctivae clear. Neuro: CN II exam reveals vision grossly intact.  No nystagmus at any point of gaze. Ears: Auricles well formed without lesions.  Ear canals are intact without mass or lesion.  No erythema or edema is appreciated.  The TMs are intact without fluid. Nose: External evaluation reveals normal support and skin without lesions.  Dorsum is intact.  Anterior rhinoscopy reveals congested mucosa over anterior aspect of inferior turbinates and deviated septum.  No purulence noted. Oral:  Oral cavity and oropharynx are intact, symmetric, without erythema or edema.  Mucosa is moist without lesions. Neck: Full range of motion without pain.  There is no significant lymphadenopathy.  No masses palpable.  Thyroid  bed within normal limits to palpation.  Parotid glands and submandibular glands equal bilaterally without mass.  Trachea is midline. Neuro:  CN 2-12 grossly intact.   Assessment & Plan Chronic nasal obstruction, secondary to nasal septal deviation and bilateral inferior turbinate hypertrophy. Chronic nasal obstruction due to septal deviation and turbinate hypertrophy, resulting in persistent difficulty with nasal breathing and inability to tolerate CPAP for obstructive sleep apnea. She has benefited from intranasal steroids for infection prevention but is concerned about long-term use due to retinal cupping and potential glaucoma risk. Examination confirms significant nasal airway restriction.  - The physical exam findings are reviewed with the patient. -The treatment options are extensively discussed.  Options include continuing conservative observation with medical  treatment versus surgical intervention with septoplasty and bilateral inferior turbinate reduction.  The risk, benefits,  alternatives, and details of the procedures are extensively reviewed.  Questions are invited and answered. - Explained that the procedure is performed endonasally, without external incisions, and typically takes less than one hour. - Reviewed postoperative expectations: mild epistaxis, use of nasal splints (not packing), minimal activity restrictions (no heavy lifting for 3-4 days). - Anticipated improved nasal airflow postoperatively to enhance CPAP tolerance and compliance. - Advised to continue Flonase  as long as there are no ocular symptoms, and to monitor for signs of increased intraocular pressure. - The patient would like to consider her options.  She is interested in possible referral to a facial plastic surgeon for possible combine rhinoplasty and septoplasty.  Nasal impetigo Recent onset of a painful erythematous nasal lesion, with significant pain earlier in the week. Examination revealed no active infection, but clinical suspicion is for nasal impetigo or folliculitis. Symptoms are expected to resolve with topical antibiotic therapy. - Prescribed mupirocin  ointment to be applied with a cotton-tipped applicator to the affected area (intranasal or extranasal) three times daily for up to one week. - Instructed on proper application technique  - Advised to report any worsening symptoms.

## 2024-06-17 ENCOUNTER — Other Ambulatory Visit: Payer: Self-pay

## 2024-06-17 ENCOUNTER — Other Ambulatory Visit (HOSPITAL_COMMUNITY): Payer: Self-pay

## 2024-06-17 ENCOUNTER — Other Ambulatory Visit: Payer: Self-pay | Admitting: Pharmacy Technician

## 2024-06-25 ENCOUNTER — Ambulatory Visit

## 2024-10-02 ENCOUNTER — Encounter (INDEPENDENT_AMBULATORY_CARE_PROVIDER_SITE_OTHER): Admitting: Ophthalmology

## 2024-10-24 ENCOUNTER — Inpatient Hospital Stay: Admitting: Hematology and Oncology

## 2024-10-24 ENCOUNTER — Ambulatory Visit: Admitting: Hematology and Oncology

## 2024-11-21 ENCOUNTER — Ambulatory Visit: Admitting: Obstetrics and Gynecology
